# Patient Record
Sex: Male | Born: 1965 | Race: White | Hispanic: No | Smoking: Former smoker
Health system: Southern US, Community
[De-identification: ages and names within clinical notes are randomized; demographics above are authoritative.]

## PROBLEM LIST (undated history)

## (undated) DIAGNOSIS — E785 Hyperlipidemia, unspecified: Secondary | ICD-10-CM

## (undated) DIAGNOSIS — F1910 Other psychoactive substance abuse, uncomplicated: Secondary | ICD-10-CM

## (undated) DIAGNOSIS — F329 Major depressive disorder, single episode, unspecified: Secondary | ICD-10-CM

## (undated) DIAGNOSIS — F32A Depression, unspecified: Secondary | ICD-10-CM

## (undated) DIAGNOSIS — I10 Essential (primary) hypertension: Secondary | ICD-10-CM

## (undated) DIAGNOSIS — F319 Bipolar disorder, unspecified: Secondary | ICD-10-CM

## (undated) DIAGNOSIS — F419 Anxiety disorder, unspecified: Secondary | ICD-10-CM

## (undated) DIAGNOSIS — M1990 Unspecified osteoarthritis, unspecified site: Secondary | ICD-10-CM

## (undated) HISTORY — DX: Other psychoactive substance abuse, uncomplicated: F19.10

## (undated) HISTORY — DX: Hyperlipidemia, unspecified: E78.5

## (undated) HISTORY — DX: Anxiety disorder, unspecified: F41.9

## (undated) HISTORY — DX: Depression, unspecified: F32.A

## (undated) HISTORY — PX: CARPAL TUNNEL RELEASE: SHX101

## (undated) HISTORY — PX: TONSILLECTOMY AND ADENOIDECTOMY: SHX28

## (undated) HISTORY — DX: Major depressive disorder, single episode, unspecified: F32.9

## (undated) HISTORY — PX: SPINE SURGERY: SHX786

## (undated) HISTORY — PX: FRACTURE SURGERY: SHX138

---
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
## 1999-01-30 ENCOUNTER — Emergency Department (HOSPITAL_COMMUNITY): Admission: EM | Admit: 1999-01-30 | Discharge: 1999-01-30 | Payer: Self-pay | Admitting: Emergency Medicine

## 1999-01-30 ENCOUNTER — Encounter: Payer: Self-pay | Admitting: Emergency Medicine

## 2000-01-29 ENCOUNTER — Emergency Department (HOSPITAL_COMMUNITY): Admission: EM | Admit: 2000-01-29 | Discharge: 2000-01-29 | Payer: Self-pay | Admitting: Emergency Medicine

## 2000-04-10 ENCOUNTER — Inpatient Hospital Stay (HOSPITAL_COMMUNITY): Admission: EM | Admit: 2000-04-10 | Discharge: 2000-04-16 | Payer: Self-pay | Admitting: *Deleted

## 2000-04-14 ENCOUNTER — Encounter: Payer: Self-pay | Admitting: Emergency Medicine

## 2000-04-23 ENCOUNTER — Inpatient Hospital Stay (HOSPITAL_COMMUNITY): Admission: EM | Admit: 2000-04-23 | Discharge: 2000-04-25 | Payer: Self-pay | Admitting: *Deleted

## 2001-05-06 ENCOUNTER — Emergency Department (HOSPITAL_COMMUNITY): Admission: EM | Admit: 2001-05-06 | Discharge: 2001-05-06 | Payer: Self-pay | Admitting: *Deleted

## 2006-04-24 ENCOUNTER — Emergency Department (HOSPITAL_COMMUNITY): Admission: EM | Admit: 2006-04-24 | Discharge: 2006-04-24 | Payer: Self-pay | Admitting: Emergency Medicine

## 2006-09-09 ENCOUNTER — Ambulatory Visit: Payer: Self-pay | Admitting: Orthopedic Surgery

## 2007-06-25 ENCOUNTER — Emergency Department (HOSPITAL_COMMUNITY): Admission: EM | Admit: 2007-06-25 | Discharge: 2007-06-25 | Payer: Self-pay | Admitting: Emergency Medicine

## 2009-11-25 ENCOUNTER — Emergency Department (HOSPITAL_COMMUNITY): Admission: EM | Admit: 2009-11-25 | Discharge: 2009-11-25 | Payer: Self-pay | Admitting: Emergency Medicine

## 2010-01-22 HISTORY — PX: BACK SURGERY: SHX140

## 2010-04-05 LAB — POCT CARDIAC MARKERS
CKMB, poc: 3.7 ng/mL (ref 1.0–8.0)
Myoglobin, poc: 79.8 ng/mL (ref 12–200)
Troponin i, poc: <0.05 ng/mL (ref 0.00–0.09)

## 2010-04-05 LAB — COMPREHENSIVE METABOLIC PANEL
ALT: 49 U/L (ref 0–53)
AST: 38 U/L — ABNORMAL HIGH (ref 0–37)
Albumin: 3.8 g/dL (ref 3.5–5.2)
Alkaline Phosphatase: 70 U/L (ref 39–117)
BUN: 12 mg/dL (ref 6–23)
CO2: 26 mEq/L (ref 19–32)
Calcium: 9.3 mg/dL (ref 8.4–10.5)
Chloride: 105 mEq/L (ref 96–112)
Creatinine, Ser: 0.97 mg/dL (ref 0.4–1.5)
GFR calc Af Amer: >60 mL/min (ref >60)
GFR calc non Af Amer: >60 mL/min (ref >60)
Glucose, Bld: 101 mg/dL — ABNORMAL HIGH (ref 70–99)
Potassium: 4 mEq/L (ref 3.5–5.1)
Sodium: 138 mEq/L (ref 135–145)
Total Bilirubin: 0.5 mg/dL (ref 0.3–1.2)
Total Protein: 6.5 g/dL (ref 6.0–8.3)

## 2010-04-05 LAB — CBC
HCT: 39.3 % (ref 39.0–52.0)
Hemoglobin: 13.9 g/dL (ref 13.0–17.0)
MCH: 30.5 pg (ref 26.0–34.0)
MCHC: 35.4 g/dL (ref 30.0–36.0)
MCV: 86.2 fL (ref 78.0–100.0)
Platelets: 194 10*3/uL (ref 150–400)
RBC: 4.56 MIL/uL (ref 4.22–5.81)
RDW: 11.8 % (ref 11.5–15.5)
WBC: 6.7 10*3/uL (ref 4.0–10.5)

## 2010-04-05 LAB — DIFFERENTIAL
Basophils Absolute: 0 10*3/uL (ref 0.0–0.1)
Basophils Relative: 0 % (ref 0–1)
Eosinophils Absolute: 0.1 10*3/uL (ref 0.0–0.7)
Eosinophils Relative: 2 % (ref 0–5)
Lymphocytes Relative: 33 % (ref 12–46)
Lymphs Abs: 2.2 10*3/uL (ref 0.7–4.0)
Monocytes Absolute: 0.4 10*3/uL (ref 0.1–1.0)
Monocytes Relative: 6 % (ref 3–12)
Neutro Abs: 4 10*3/uL (ref 1.7–7.7)
Neutrophils Relative %: 59 % (ref 43–77)

## 2010-04-25 ENCOUNTER — Encounter: Payer: Self-pay | Admitting: Family Medicine

## 2010-04-25 DIAGNOSIS — E781 Pure hyperglyceridemia: Secondary | ICD-10-CM | POA: Insufficient documentation

## 2010-04-25 DIAGNOSIS — E8881 Metabolic syndrome: Secondary | ICD-10-CM | POA: Insufficient documentation

## 2010-04-25 DIAGNOSIS — E291 Testicular hypofunction: Secondary | ICD-10-CM

## 2010-04-25 DIAGNOSIS — Z720 Tobacco use: Secondary | ICD-10-CM | POA: Insufficient documentation

## 2010-04-25 DIAGNOSIS — I10 Essential (primary) hypertension: Secondary | ICD-10-CM | POA: Insufficient documentation

## 2010-04-25 DIAGNOSIS — F319 Bipolar disorder, unspecified: Secondary | ICD-10-CM | POA: Insufficient documentation

## 2010-05-01 ENCOUNTER — Other Ambulatory Visit: Payer: Self-pay | Admitting: Family Medicine

## 2010-05-01 DIAGNOSIS — M545 Low back pain, unspecified: Secondary | ICD-10-CM

## 2010-05-03 ENCOUNTER — Ambulatory Visit
Admission: RE | Admit: 2010-05-03 | Discharge: 2010-05-03 | Disposition: A | Discharge status: Home / Self Care | Payer: 59 | Source: Ambulatory Visit | Attending: Family Medicine | Admitting: Family Medicine

## 2010-05-03 DIAGNOSIS — M545 Low back pain, unspecified: Secondary | ICD-10-CM

## 2010-05-16 ENCOUNTER — Other Ambulatory Visit: Payer: Self-pay | Admitting: Neurosurgery

## 2010-05-16 DIAGNOSIS — M79605 Pain in left leg: Secondary | ICD-10-CM

## 2010-05-16 DIAGNOSIS — M545 Low back pain, unspecified: Secondary | ICD-10-CM

## 2010-05-17 ENCOUNTER — Ambulatory Visit
Admission: RE | Admit: 2010-05-17 | Discharge: 2010-05-17 | Disposition: A | Discharge status: Home / Self Care | Payer: 59 | Source: Ambulatory Visit | Attending: Neurosurgery | Admitting: Neurosurgery

## 2010-05-17 DIAGNOSIS — M79605 Pain in left leg: Secondary | ICD-10-CM

## 2010-05-17 DIAGNOSIS — M545 Low back pain, unspecified: Secondary | ICD-10-CM

## 2010-05-24 ENCOUNTER — Encounter (HOSPITAL_COMMUNITY)
Admission: RE | Admit: 2010-05-24 | Discharge: 2010-05-24 | Disposition: A | Discharge status: Home / Self Care | Payer: 59 | Source: Ambulatory Visit | Attending: Neurosurgery | Admitting: Neurosurgery

## 2010-05-24 ENCOUNTER — Ambulatory Visit (HOSPITAL_COMMUNITY)
Admission: RE | Admit: 2010-05-24 | Discharge: 2010-05-24 | Disposition: A | Discharge status: Home / Self Care | Payer: 59 | Source: Ambulatory Visit | Attending: Neurosurgery | Admitting: Neurosurgery

## 2010-05-24 ENCOUNTER — Other Ambulatory Visit (HOSPITAL_COMMUNITY): Payer: Self-pay | Admitting: Neurosurgery

## 2010-05-24 DIAGNOSIS — Z01818 Encounter for other preprocedural examination: Secondary | ICD-10-CM | POA: Insufficient documentation

## 2010-05-24 DIAGNOSIS — Z01812 Encounter for preprocedural laboratory examination: Secondary | ICD-10-CM | POA: Insufficient documentation

## 2010-05-24 DIAGNOSIS — M5126 Other intervertebral disc displacement, lumbar region: Secondary | ICD-10-CM | POA: Insufficient documentation

## 2010-05-24 DIAGNOSIS — Z01811 Encounter for preprocedural respiratory examination: Secondary | ICD-10-CM | POA: Insufficient documentation

## 2010-05-24 LAB — COMPREHENSIVE METABOLIC PANEL
ALT: 35 U/L (ref 0–53)
AST: 28 U/L (ref 0–37)
Albumin: 3.7 g/dL (ref 3.5–5.2)
CO2: 30 mEq/L (ref 19–32)
Calcium: 9.9 mg/dL (ref 8.4–10.5)
Chloride: 96 mEq/L (ref 96–112)
GFR calc Af Amer: >60 mL/min (ref >60)
GFR calc non Af Amer: >60 mL/min (ref >60)
Sodium: 135 mEq/L (ref 135–145)
Total Bilirubin: 0.5 mg/dL (ref 0.3–1.2)

## 2010-05-24 LAB — CBC
Hemoglobin: 15.5 g/dL (ref 13.0–17.0)
Platelets: 268 10*3/uL (ref 150–400)
RBC: 5.21 MIL/uL (ref 4.22–5.81)
WBC: 9.9 10*3/uL (ref 4.0–10.5)

## 2010-05-26 ENCOUNTER — Inpatient Hospital Stay (HOSPITAL_COMMUNITY): Payer: 59

## 2010-05-26 ENCOUNTER — Inpatient Hospital Stay (HOSPITAL_COMMUNITY)
Admission: RE | Admit: 2010-05-26 | Discharge: 2010-05-27 | DRG: 491 | Disposition: A | Discharge status: Home / Self Care | Payer: 59 | Source: Ambulatory Visit | Attending: Neurosurgery | Admitting: Neurosurgery

## 2010-05-26 ENCOUNTER — Other Ambulatory Visit (HOSPITAL_COMMUNITY): Payer: Self-pay | Admitting: Neurosurgery

## 2010-05-26 ENCOUNTER — Ambulatory Visit (HOSPITAL_COMMUNITY)
Admission: RE | Admit: 2010-05-26 | Discharge: 2010-05-26 | Disposition: A | Discharge status: Home / Self Care | Payer: 59 | Source: Ambulatory Visit | Attending: Neurosurgery | Admitting: Neurosurgery

## 2010-05-26 DIAGNOSIS — M5126 Other intervertebral disc displacement, lumbar region: Principal | ICD-10-CM | POA: Diagnosis present

## 2010-05-26 DIAGNOSIS — F411 Generalized anxiety disorder: Secondary | ICD-10-CM | POA: Diagnosis present

## 2010-05-26 DIAGNOSIS — I10 Essential (primary) hypertension: Secondary | ICD-10-CM | POA: Diagnosis present

## 2010-05-26 DIAGNOSIS — M549 Dorsalgia, unspecified: Secondary | ICD-10-CM

## 2010-05-26 DIAGNOSIS — F17200 Nicotine dependence, unspecified, uncomplicated: Secondary | ICD-10-CM | POA: Diagnosis present

## 2010-05-26 DIAGNOSIS — F319 Bipolar disorder, unspecified: Secondary | ICD-10-CM | POA: Diagnosis present

## 2010-05-26 DIAGNOSIS — E669 Obesity, unspecified: Secondary | ICD-10-CM | POA: Diagnosis present

## 2010-05-28 NOTE — Op Note (Signed)
NAME:  Tim [name], Tim [name]               ACCOUNT NO.:  [id number]  MEDICAL RECORD NO.:  [id number]           PATIENT TYPE:  O  LOCATION:  XRAY                         FACILITY:  [facility]  PHYSICIAN:  Hilda Lias, M.D.   DATE OF BIRTH:  05/19/1965  DATE OF PROCEDURE:  05/26/2010 DATE OF DISCHARGE:  05/26/2010                              OPERATIVE REPORT   PREOPERATIVE DIAGNOSIS:  Left L4-L5 herniated disk, severe degenerative disk disease with left L5-1 stenosis, multiple level degenerative disk disease, right and left.  POSTOPERATIVE DIAGNOSIS:  Left L4-L5 herniated disk, severe degenerative disk disease with left L5-1 stenosis, multiple level degenerative disk disease, right and left.  PROCEDURE:  Left L4-5 diskectomy, decompressing the thecal sac, L4-L5 nerve root.  The left L5-S1 foraminotomy and laminotomy with decompression of the L5 and S1 nerve root.  Microscope.  SURGEON:  Hilda Lias, MD.  CLINICAL HISTORY:  Ms. Zane is a 45 year old gentleman, complaining of back pain, radiating to the left neck, failing conservative treatment. The patient's MRI showed multiple level degenerative disk disease from T12 down to L5-S1.  Although, by x-ray, he had problem also at the right side and symptomatic on to the left side associated with a herniated disk at the L4-L5 and L5-S1 foraminal narrowing.  The patient want to proceed with surgery because of no improvement.  He know the risks of paralysis and need for further surgery.  He knows about the need to getting better and physical shape.  DESCRIPTION OF PROCEDURE:  The patient was taken to the OR and after intubation, he was positioned in a prone manner.  The back was cleaned with DuraPrep.  Drapes were applied.  We were unable to feel any bony structure and the midline incision was made through the skin and subcutaneous tissue through the thick adipose tissue to reach the spinous process.  Retraction was done laterally and  x-rays showed that indeed we were at the level of L4-5 and L5-S1.  He has one __________ space, so we are talking about space of the sacrum and therefore below as confirmed with radiologist.  With the help of the microscope, we drilled the lower lamina of L5 and the upper of S1.  The yellow ligament was also excised.  At this level, L5-S2, we found he has quite a hypertrophied facet with narrowing of the foramen, and foraminotomy to decompress the thecal sac as well as the S1 nerve root was achieved. Then, after we drilled the L4 and L5 lamina, we removed the yellow ligament.  We retracted the thecal sac and there was a herniated disk mostly superior and medial.  Incision was made at the disk space and large amount of degenerative disk was removed.  At the end, we had gross diskectomy with plenty of space for the thecal sac as well as the L4, L5, and S1 nerve root.  The area was irrigated.  Valsalva maneuver was negative.  Then, Depo-Medrol and fentanyl were left in the epidural space, and the wound was closed with Vicryl and Steri-Strips.          ______________________________ Hilda Lias, M.D.  EB/MEDQ  D:  05/26/2010  T:  05/27/2010  Job:  [id number]  Electronically Signed by Hilda Lias M.D. on 05/28/2010 08:11:55 PM

## 2010-06-09 NOTE — H&P (Signed)
[facility]  Patient:    Tim [name], Tim [name]                      MRN: [id number] Adm. Date:  [id number] Attending:  Denny Peon Dictator:   Candi Leash. Theressa Stamps, N.P.                   Psychiatric Admission Assessment  DATE OF ADMISSION:  April 10, 2000  PATIENT IDENTIFICATION:  This is a 45 year old married white male voluntarily admitted on April 10, 2000 for alcohol detoxification, cocaine abuse, and depression.  HISTORY OF PRESENT ILLNESS:  The patient presents with a long history of alcohol abuse and cocaine abuse.  The patient started drinking at the age of 54, has been drinking for the past two to three years, drinking 16- 12 ounce beers per day.  He starts drinking in the morning.  He has no history of DTs or seizures.  He does have a history of blackouts.  He has been drinking heavily since 1996 after his divorce.  The patient has a history of depression for years, had a suicide attempt back when he was 18.  He has been on antidepressants years ago but only very intermittently.  The patient denies any auditory or visual hallucinations, has some feelings of paranoia, currently denying any suicide attempts.  The patient sleeps poorly, has been gaining weight he feels from the alcohol.  PAST PSYCHIATRIC HISTORY:  He has had an admission when he was 60 at [facility] and hospitalized in 1997 for alcohol and cocaine detoxification.  He presently has no outpatient treatment.  SUBSTANCE ABUSE HISTORY:  He has smoked a half a pack a day for years.  His alcohol habits include he started drinking when he was 20 but drinking heavily since 1996, 16- 12 ounce beers a day for the past two to three years.  He had an approximately one week history of sobriety.  He has a history of blackouts, no history of seizures or DTs.  His last drink was on March 20 at 12:30 p.m. The patient also has been using cocaine for several months, uses about an eight ball a day;  last use approximately two weeks ago.  Also has a history of marijuana and has been taking an occasional Xanax over the past couple of months.  PAST MEDICAL HISTORY:  Primary care Alaena Strader: None.  Medical problems: He reports he has had elevated blood pressure, takes no medications. Medications: He takes Tylenol PM every evening for sleep.  No known drug allergies.  REVIEW OF SYSTEMS:  The patient reports night sweats with change in his appetite.  He has noticed increased weight gain.  No fever or chills.  The patient has problems sometimes with nausea.  He states he vomits after his breakfast.  The patient has noticed some blurred vision.  Has a history of diabetes in his family.  It has been a couple of years since he has had an eye exam.  The patient reports some heart problems when he gets anxious; no chest pain or chest pressure.  He has some shortness of breath, he feels from his weight, and some panic attacks.  He coughs every morning.  He smokes, has been since the age of 75.  The patient states he gets nauseated every morning.  He has some problems with heartburn.  No constipation, no diarrhea, no blood in his stool, no urinary frequency or hematuria, no discharge.  No joint  swelling, stiffness or joint pain.  He has an occasional migraine.  He has a history of depression.  No history of environmental allergies.  PHYSICAL EXAMINATION:  Height 5 feet 11 inches, weight 267 pounds.  Last vital signs were temperature 99.7, heart rate 119, respiratory rate 20, blood pressure 160/99.  A 45 year old well-developed, overweight white male sitting on the exam table in no acute distress.  Does not appear to be acutely or chronically ill.  Skin is warm and dry without significant discoloration or bruising.  Head is normocephalic, scalp is clean, hair is of normal distribution of color.  Face: No marked asymmetry, can raise his eyebrows, can puff out his cheeks.  Cranial nerves are intact.   Eyes: Pupils equal, round and reactive to light.  EOMs are intact.  Funduscopic exam reveal sharp disk margins, no hemorrhages were noted.  Nose: No sinus discharge, turbinates are not swollen.  Ears: Canals are clear, tympanic membranes are intact, not injected.  Mouth: No unusual breath odors, no pharyngeal exudate, no lesions were seen.  Tongue protrudes without any tremor.  Teeth are in fair dentition. Thyroid is not enlarged and nontender.  Neck: Trachea is midline, negative lymphadenopathy.  Chest is clear to auscultation.  Heart is regular rate and rhythm without murmurs, gallops, or rubs.  Abdomen is soft, protuberant; no masses or tenderness, no palpable organomegaly, positive bowel sounds. Genitalia is deferred.  Extremities: Full range of motion in joints, good grip strength bilaterally.  Back: Normal curvature, no pain upon palpation. Neurologic: Alert, cranial nerves are intact.  Reflexes are 2+, a little diminished on the left.  The patient has had a fair amount of surgery on that left knee.  Cerebellar function is intact, heel-to-shin, normal alternating movements.  Romberg is negative.  Gait is steady.  Health maintenance issues were discussed in regard to eye exams, dental exams.  LABORATORY DATA:  SGOT elevated at 61, SGPT 103.  Alcohol level was 7.  Blood pressure on admission was 160/99 with the patient slightly febrile at 99.7.  SOCIAL HISTORY:  He is a 45 year old married white male, been married for four years.  He has one biological child the age of 2.  He lives with his child, stepchild, and his spouse.  He is unemployed.  He has been doing some work from his home.  He completed high school.  He has no legal problems.  Family history: Mothers and fathers side both have problems with depression.  He has an aunt with obsessive-compulsive disorder and depression.  MENTAL STATUS EXAMINATION:  Alert but reports feeling very sleepy, casually dressed.  Little eye  contact, cooperative.  Speech is normal and relevant. Mood is depressed.  Affect is sad.  Thought processes are coherent.  No evidence of psychosis, no suicidal or homicidal ideations, no auditory or  visual hallucinations, some feelings of paranoia on the day of admission. Cognitive functioning is intact.  Memory is fair.  Judgment is poor.  Insight is fair.  Poor impulse control.  ADMISSION DIAGNOSES: Axis I:    1. Depressive disorder, not otherwise specified.            2. Alcohol dependence.            3. Cocaine abuse. Axis II:   Deferred. Axis III:  None. Axis IV:   Severe with problems relating to primary support group, occupation,            and other psychosocial problems related to alcohol abuse and  cocaine abuse. Axis V:    Current is 30, this past year is 60.  INITIAL PLAN OF CARE:  Voluntary admission to [facility] for alcohol dependence, cocaine abuse, and depression.  Contract for safety, check every 15 minutes; the patient agrees to be safe.  Will initiate phenobarbital protocol.  Will obtain labs, blood alcohol stat, CMET, and urine drug screen. The patient will be started on an antidepressant to decrease his depressive symptoms.  Will order some trazodone for sleep.  Will initiate Protonix 40 mg for acid reflux.  Phenergan will be available for nausea.  Encourage fluids. The patient is to attend groups.  Family session will also be ordered.  Plan is to return the patient to his prior living arrangement and to attend either AA meetings or a rehabilitation program.  ESTIMATED LENGTH OF STAY:  Three to five days. DD:  04/11/00 TD:  04/12/00 Job: [id number]

## 2010-06-09 NOTE — H&P (Signed)
[facility]  Patient:    Tim [name], Tim [name]                      MRN: [id number] Adm. Date:  [id number] Disc. Date: 04/25/00 Attending:  Jasmine Pang CC:         [facility]   Psychiatric Admission Assessment  IDENTIFYING INFORMATION:  This is a 45 year old Caucasian male who had just been discharged from our unit in the last week.  He was readmitted after referral by his outpatient therapist at the [facility].  HISTORY OF PRESENT ILLNESS:  Patient has a history of depression and alcohol dependence.  He was just discharged after a detox from alcohol last week.  He was also placed on Prozac 20 mg q.a.m. and Klonopin 0.5 mg p.o. q.i.d.  He was readmitted after escalating depressive symptoms with multiple neurovegetative symptoms including irritability, insomnia, feelings of hopelessness and worthlessness, anergia, anhedonia, and difficulty concentrating.  He began to plan to kill himself by taking an overdose.  PAST PSYCHIATRIC HISTORY:  Patient is treated outpatient at the [facility] as indicated above.  He has had a recent hospitalization at [facility].  Dr. Lourdes Sledge was his doctor.  He was hospitalized at [facility] several years ago.  PAST MEDICAL HISTORY:  Patient has hypertension, GERD, migraines.  MEDICATIONS: 1. Prozac 20 mg q.d. 2. Clonazepam 0.5 mg t.i.d. 3. Trazodone 100 mg q.h.s. 4. Protonix 40 mg q.d. 5. Inderal 20 mg in the morning and at h.s.  DRUG ALLERGIES:  No known drug allergies.  FAMILY PSYCHIATRIC HISTORY:  Mother and father have a history of depression. His maternal aunt has a history of depression.  Maternal aunt has a history of alcoholism.  Uncle has a history of alcoholism.  SUBSTANCE ABUSE HISTORY:  Patient has a history of alcohol and cocaine dependence.  He states until recently cocaine has been his drug of choice, but he has had to decrease this use due to finances.  He  has also used other drugs in the past.  SOCIAL HISTORY:  Patient lives with his wife and two children (ages 2 and 8). He states his wife is very supportive.  He has no legal problems.  ADMISSION MENTAL STATUS EXAMINATION:  Friendly, cooperative, somewhat disheveled Caucasian male with intermittent eye contact.  There was psychomotor retardation.  Speech normal rate and slow, verbal, spontaneous. Mood was depressed and anxious.  Affect consistent with mood, but able to reveal wide range.  Positive suicidal ideation upon admission, but he denies now.  No homicidal ideation.  There was no psychosis or perceptual disturbance.  Thought processes were logical and goal directed.  Thought content revolved around his upset that many of his peers from the previous admission have relapsed (he states he had been keeping in contact with most of them).  On cognitive exam patient was alert and oriented to person, place, time, and reason for being in the hospital.  Short term and long term memory were adequate.  General fund of knowledge was age and education level appropriate. Attention and concentration were diminished.  Judgment and insight fair.  ADMISSION DIAGNOSES: Axis I:    1. Major depression, recurrent, severe, without psychosis.            2. Alcohol dependence.            3. Cocaine dependence. Axis II:   Deferred. Axis III:  1. Hypertension.  2. Gastroesophageal reflux disease.            3. Migraines. Axis IV:   Severe. Axis V:    Current global assessment of functioning is 30, highest past year            is 65.  PATIENT ASSETS AND STRENGTHS:  Patient was healthy, supportive family, help seeking attitude.  PROBLEMS:  Mood instability and chemical dependence with suicidal ideation.  SHORT TERM TREATMENT GOAL:  Resolution of suicidal ideation.  LONG TERM TREATMENT GOAL:  Resolution of mood instability, commitment to an active recovery program.  HOSPITAL COURSE:   Upon admission patient was continued on his home medications of Prozac 20 mg q.d., Klonopin 0.5 mg p.o. q.i.d., Trazodone 50 mg p.o. q.h.s., Inderal 20 mg q.a.m. and q.h.s., Protonix 40 mg q.d., and multivitamin one p.o. q.d.  He was given a one time dose of Ambien 10 mg due to his difficulty getting to sleep on the first night of admission; however, in general, he states the Trazodone has been beneficial for sleep.  On April 24, 2000, Prozac was increased to 30 mg q.a.m.  Klonopin was increased to 1 mg p.o. q.i.d. due to continued anxiety.  Some sedation with the increased dose, but overall no side effects.  He participated appropriately in unit therapeutic groups and activities.  We were able to use this hospitalization as crisis stabilization and he recompensated quickly.  He was ready for discharge soon after admission.  DISCHARGE MENTAL STATUS EXAMINATION:  Mood was improved.  He was less anxious and depressed.  Affect was wider range.  There were no suicidal or homicidal ideation.  There was no psychosis or perceptual disturbance.  Thought processes were logical and goal directed.  Cognitive exam was back to baseline.  Judgment and insight remained fair.  DISCHARGE DIAGNOSES: Axis I:    1. Major depressive disorder, recurrent, severe, without psychosis.            2. Alcohol dependence.            3. Cocaine dependence. Axis II:   Deferred. Axis III:  1. Hypertension.            2. Gastroesophageal reflux disease.            3. Migraine headaches. Axis IV:   Severe. Axis V:    Global assessment of functioning, current is 30, highest past year            58.  DISCHARGE MEDICATIONS: 1. Prozac 30 mg q.a.m. 2. Klonopin 1 mg p.o. q.i.d. 3. Trazodone 50 mg p.o. q.h.s. 4. Inderal 20 mg q.a.m. and h.s. 5. Protonix 40 mg q.d.  ACTIVITY LEVEL:  No restrictions.  DIET:  No restrictions.  POST HOSPITAL CARE PLANS:  Return to [facility] for follow-up therapy and medication  management. DD:  04/25/00 TD:  04/25/00  Job: [id number]

## 2010-06-09 NOTE — Discharge Summary (Signed)
[facility]  Patient:    Tim [name], Tim [name]                        MRN: [id number] Adm. Date:  [id number] Disc. Date: [id number] Attending:  Annamarie Dawley Dictator:   Candi Leash. Orsini, N.P.                           Discharge Summary  HISTORY OF PRESENT ILLNESS:  This is a 45 year old married white male voluntarily admitted for alcohol detox, cocaine abuse and depression.  The patient presents with a long history of alcohol abuse and cocaine abuse.  He has been drinking since the age of 85.  He drinks all day.  He has no history of DTs or seizures.  He has a history of blackouts.  He has been drinking heavily since 1996 after his divorce.  The patient also has a history of depression for years with a suicide attempt when he was 45 years old.  He has been on antidepressants years ago but only on an intermittent basis.  The patient was denying any hallucinations, having some feelings of paranoia. Currently denying any suicide attempts. He sleeps poorly. He has been gaining some weight, which he feels is from the alcohol.  The patient did have admission when he was 42 at [facility] and also years ago for alcohol and cocaine detox.  He is currently not having any outpatient treatments.  PRIMARY CARE PHYSICIAN:  The patient has no primary care Jeanmarie Mccowen.  MEDICAL PROBLEMS:  Hypertension.  He is presently on no medications.  CURRENT MEDICATIONS:  Tylenol P.M. every evening for sleep.  ALLERGIES:  No known drug allergies.  PHYSICAL EXAMINATION:  This was performed and the patient had a mild elevated temperature at 99.7.  He was overweight at 267 pounds.  Overall, the patient presented no acute or chronic health problems.  Blood pressure was somewhat elevated at 160/99.  LABORATORY DATA:  SGOT was elevated 61, SGPT was 103.  Alcohol level was 7.  MENTAL STATUS EXAMINATION:  He is alert but reports feeling very sleepy.  He is casually dressed, little eye  contact, and cooperative. Speech is normal and relevant.  Mood is depressed. Affect is sad.  Thought processes are coherent. No evidence of psychosis.  No evidence of homicidal or suicidal ideation.  No auditory or visual hallucinations.  There were some feelings of paranoia on the day of admission.  Cognitive functioning is intact.  Memory is fair. Judgement is poor.  Insight is fair.  Impulse control is poor.  ADMISSION DIAGNOSES: AXIS I.   1. Depressive disorder, not otherwise specified.           2. Alcohol dependence.           3. Cocaine abuse. AXIS II.  Deferred. AXIS III. None. AXIS IV.  Severe with problems relating to primary support group, occupation,           and other psychosocial problems related to alcohol abuse and           cocaine. AXIS V.   Current is 30, this past year was 60.  HOSPITAL COURSE:  A voluntary admission for alcohol dependence and cocaine abuse and depression.  The patient will be monitored every 15 minutes, will initiate the phenobarbital protocol, obtain labs and blood alcohol stat as well as CMET and urine drug screen.  The patient will be started  on antidepressant to decrease his depressive symptoms.  Will order trazodone for sleep, initiate Protonix 40 mg for acid reflux, Phenergan will be available for nausea. Will encourage fluids.  The patient is to attend groups with a family session to be ordered.  We were felt the patient should return back to his prior living arrangements and attend either AA meetings or a rehab program.  The patient maintained that he was very depressed but had no intentions of hurting himself.  His family was very supportive.  We added Pamelor for his treatment of depression.  He was experiencing some withdrawal symptoms.  His vital signs, though, remained stable.  He was having some complaints of headache and pain.  The patient did have an episode of chest pain.  He was transferred to the emergency department for  evaluation.  His work-up was negative.  It was probably related to his severe anxiety. We started the patient on some Klonopin on a routine and p.r.n. basis.  The patient was doing much better on his phenobarbital protocol.  He was much calmer.  The patient continued having episodes of anxiety that were relieved with his Klonopin.  CONDITION ON DISCHARGE:  The patient received great benefit from his Klonopin. He was feeling much better.  He was presenting with a bright affect. There were no withdrawal symptoms.  No sign of psychosis.  No suicidal ideation. The patient will be started on Inderal 20 mg for his elevated blood pressure. He has no history of asthma.  It was felt that the patient could be managed on an outpatient basis.  He was having considerable insight into his problem and was pledging sobriety.  FOLLOW-UP:  The patient was to follow up with the [facility] with the phone number and address provided.  He was to be there on April 17, 2000, and to have a psychiatric follow-up with Dr. Octavio Graves.  He was also to follow up with his primary care Tawnia Schirm for liver function test.  He was also to call his family physician for treatment of his elevated blood pressure and call if there are any problems with medications or if he was having depression. The patient was also to attend AA and NA meetings. He was to abstain from alcohol.  DISCHARGE MEDICATIONS: 1. Prozac 20 mg one q.d. 2. Trazodone 50 mg one q.h.s. as needed for insomnia. 3. Klonopin 0.5 mg three times a day as needed for anxiety. 4. Inderal 20 mg one b.i.d. for elevated blood pressure. 5. Protonix 40 mg one daily.  DISCHARGE DIAGNOSES: AXIS I.   1. Depressive disorder, not otherwise specified.           2. Alcohol dependence.           3. Cocaine abuse. AXIS II.  Deferred. AXIS III. None. AXIS IV.  Severe psychosocial stressors. AXIS V.   Current is 83, this past year was 60.DD:  05/22/00 TD:  05/24/00 Job:  [id number]

## 2011-01-22 ENCOUNTER — Encounter (HOSPITAL_COMMUNITY): Payer: Self-pay | Admitting: *Deleted

## 2011-01-22 ENCOUNTER — Other Ambulatory Visit: Payer: Self-pay

## 2011-01-22 ENCOUNTER — Emergency Department (HOSPITAL_COMMUNITY)
Admission: EM | Admit: 2011-01-22 | Discharge: 2011-01-23 | Disposition: A | Discharge status: Other Acute Inpatient Hospital | Payer: Self-pay | Attending: Emergency Medicine | Admitting: Emergency Medicine

## 2011-01-22 DIAGNOSIS — R45851 Suicidal ideations: Secondary | ICD-10-CM | POA: Insufficient documentation

## 2011-01-22 DIAGNOSIS — I10 Essential (primary) hypertension: Secondary | ICD-10-CM | POA: Insufficient documentation

## 2011-01-22 DIAGNOSIS — Z79899 Other long term (current) drug therapy: Secondary | ICD-10-CM | POA: Insufficient documentation

## 2011-01-22 HISTORY — DX: Essential (primary) hypertension: I10

## 2011-01-22 LAB — URINALYSIS, ROUTINE W REFLEX MICROSCOPIC
Glucose, UA: NEGATIVE mg/dL
Ketones, ur: NEGATIVE mg/dL
Leukocytes, UA: NEGATIVE
Nitrite: NEGATIVE
Specific Gravity, Urine: 1.029 (ref 1.005–1.030)
pH: 5.5 (ref 5.0–8.0)

## 2011-01-22 LAB — CBC
HCT: 44.8 % (ref 39.0–52.0)
Hemoglobin: 15.5 g/dL (ref 13.0–17.0)
MCHC: 34.6 g/dL (ref 30.0–36.0)
RBC: 5.22 MIL/uL (ref 4.22–5.81)
WBC: 8.1 10*3/uL (ref 4.0–10.5)

## 2011-01-22 LAB — COMPREHENSIVE METABOLIC PANEL
Albumin: 3.9 g/dL (ref 3.5–5.2)
Alkaline Phosphatase: 88 U/L (ref 39–117)
BUN: 19 mg/dL (ref 6–23)
Calcium: 9.4 mg/dL (ref 8.4–10.5)
GFR calc Af Amer: 82 mL/min — ABNORMAL LOW (ref >90)
Glucose, Bld: 104 mg/dL — ABNORMAL HIGH (ref 70–99)
Potassium: 4.6 mEq/L (ref 3.5–5.1)
Total Protein: 7.5 g/dL (ref 6.0–8.3)

## 2011-01-22 LAB — RAPID URINE DRUG SCREEN, HOSP PERFORMED
Amphetamines: NOT DETECTED
Benzodiazepines: NOT DETECTED
Opiates: NOT DETECTED
Tetrahydrocannabinol: NOT DETECTED

## 2011-01-22 NOTE — ED Provider Notes (Signed)
11:15 PM  Date: 01/22/2011  Rate: 87  Rhythm: normal sinus rhythm  QRS Axis: normal  Intervals: normal  ST/T Wave abnormalities: normal  Conduction Disutrbances:none  Narrative Interpretation: Normal EKG.  Old EKG Reviewed: unchanged    Carleene Cooper III, MD 01/22/11 [phone]

## 2011-01-22 NOTE — ED Provider Notes (Cosign Needed)
History     CSN: [id number]  Arrival date & time 01/22/11  7829   First MD Initiated Contact with Patient 01/22/11 2147      Chief Complaint  Patient presents with  . Medical Clearance  . Suicidal    (Consider location/radiation/quality/duration/timing/severity/associated sxs/prior treatment) HPI Comments: Patient sent from [facility] for medical clearance labs. Patient states he is depressed and has thoughts of hurting himself. Patient states he feels healthy today except for some mild anxiety. He denies chest pain, shortness of breath, fever, chills, nausea, vomiting, or diarrhea. Patient has chronic back pain. The patient denies homicidal ideation.  Patient is a 45 y.o. male presenting with mental health disorder. The history is provided by the patient.  Mental Health Problem The current episode started more than 1 month ago.  Additional symptoms of the illness do not include no headaches or no abdominal pain. He admits to suicidal ideas.    Past Medical History  Diagnosis Date  . Substance abuse   . Hypertension     History reviewed. No pertinent past surgical history.  History reviewed. No pertinent family history.  History  Substance Use Topics  . Smoking status: Former Games developer  . Smokeless tobacco: Not on file  . Alcohol Use: Yes     daily      Review of Systems  Constitutional: Negative for fever and chills.  HENT: Negative for sore throat and rhinorrhea.   Eyes: Negative for redness.  Respiratory: Negative for shortness of breath.   Cardiovascular: Negative for chest pain.  Gastrointestinal: Negative for nausea, vomiting, abdominal pain, diarrhea and constipation.  Genitourinary: Negative for dysuria.  Musculoskeletal: Negative for myalgias.  Skin: Negative for rash.  Neurological: Negative for dizziness and headaches.  Psychiatric/Behavioral: Positive for suicidal ideas.    Allergies  Review of patient's allergies indicates no known allergies.  Home  Medications   Current Outpatient Rx  Name Route Sig Dispense Refill  . QUETIAPINE FUMARATE 100 MG PO TABS Oral Take 100 mg by mouth at bedtime.      Marland Kitchen CITALOPRAM HYDROBROMIDE 40 MG PO TABS Oral Take 40 mg by mouth daily.      . FENOFIBRATE 145 MG PO TABS Oral Take 145 mg by mouth daily.      Marland Kitchen HYDROCHLOROTHIAZIDE 25 MG PO TABS Oral Take 25 mg by mouth daily.      Marland Kitchen LAMOTRIGINE 100 MG PO TABS Oral Take 100 mg by mouth daily.      . TESTOSTERONE CYPIONATE 200 MG/ML IM OIL Intramuscular Inject 200 mg into the muscle every 14 (fourteen) days.        BP 183/103  Pulse 84  Temp(Src) 97.7 F (36.5 C) (Oral)  Resp 20  SpO2 97%  Physical Exam  Nursing note and vitals reviewed. Constitutional: He is oriented to person, place, and time. He appears well-developed and well-nourished.  HENT:  Head: Normocephalic and atraumatic.  Eyes: Conjunctivae are normal. Right eye exhibits no discharge. Left eye exhibits no discharge.  Neck: Normal range of motion. Neck supple.  Cardiovascular: Normal rate, regular rhythm and normal heart sounds.   Pulmonary/Chest: Effort normal and breath sounds normal.  Abdominal: Soft. Bowel sounds are normal. There is no tenderness. There is no rebound and no guarding.  Musculoskeletal: He exhibits no edema.  Neurological: He is alert and oriented to person, place, and time.  Skin: Skin is warm and dry.  Psychiatric: He has a normal mood and affect.    ED Course  Procedures (including  critical care time)  Labs Reviewed  COMPREHENSIVE METABOLIC PANEL - Abnormal; Notable for the following:    Glucose, Bld 104 (*)    AST 46 (*) SLIGHT HEMOLYSIS   ALT 73 (*)    GFR calc non Af Amer 70 (*)    GFR calc Af Amer 82 (*)    All other components within normal limits  ETHANOL  URINE RAPID DRUG SCREEN (HOSP PERFORMED)  URINALYSIS, ROUTINE W REFLEX MICROSCOPIC  CBC  TSH   Results for orders placed during the hospital encounter of 01/22/11  ETHANOL      Component  Value Range   Alcohol, Ethyl (B) <11  0 - 11 (mg/dL)  URINE RAPID DRUG SCREEN (HOSP PERFORMED)      Component Value Range   Opiates NONE DETECTED  NONE DETECTED    Cocaine NONE DETECTED  NONE DETECTED    Benzodiazepines NONE DETECTED  NONE DETECTED    Amphetamines NONE DETECTED  NONE DETECTED    Tetrahydrocannabinol NONE DETECTED  NONE DETECTED    Barbiturates NONE DETECTED  NONE DETECTED   COMPREHENSIVE METABOLIC PANEL      Component Value Range   Sodium 136  135 - 145 (mEq/L)   Potassium 4.6  3.5 - 5.1 (mEq/L)   Chloride 99  96 - 112 (mEq/L)   CO2 26  19 - 32 (mEq/L)   Glucose, Bld 104 (*) 70 - 99 (mg/dL)   BUN 19  6 - 23 (mg/dL)   Creatinine, Ser 1.19  0.50 - 1.35 (mg/dL)   Calcium 9.4  8.4 - 14.7 (mg/dL)   Total Protein 7.5  6.0 - 8.3 (g/dL)   Albumin 3.9  3.5 - 5.2 (g/dL)   AST 46 (*) 0 - 37 (U/L)   ALT 73 (*) 0 - 53 (U/L)   Alkaline Phosphatase 88  39 - 117 (U/L)   Total Bilirubin 0.3  0.3 - 1.2 (mg/dL)   GFR calc non Af Amer 70 (*) >90 (mL/min)   GFR calc Af Amer 82 (*) >90 (mL/min)  URINALYSIS, ROUTINE W REFLEX MICROSCOPIC      Component Value Range   Color, Urine YELLOW  YELLOW    APPearance CLEAR  CLEAR    Specific Gravity, Urine 1.029  1.005 - 1.030    pH 5.5  5.0 - 8.0    Glucose, UA NEGATIVE  NEGATIVE (mg/dL)   Hgb urine dipstick NEGATIVE  NEGATIVE    Bilirubin Urine NEGATIVE  NEGATIVE    Ketones, ur NEGATIVE  NEGATIVE (mg/dL)   Protein, ur NEGATIVE  NEGATIVE (mg/dL)   Urobilinogen, UA 0.2  0.0 - 1.0 (mg/dL)   Nitrite NEGATIVE  NEGATIVE    Leukocytes, UA NEGATIVE  NEGATIVE   CBC      Component Value Range   WBC 8.1  4.0 - 10.5 (K/uL)   RBC 5.22  4.22 - 5.81 (MIL/uL)   Hemoglobin 15.5  13.0 - 17.0 (g/dL)   HCT 82.9  56.2 - 13.0 (%)   MCV 85.8  78.0 - 100.0 (fL)   MCH 29.7  26.0 - 34.0 (pg)   MCHC 34.6  30.0 - 36.0 (g/dL)   RDW 86.5  78.4 - 69.6 (%)   Platelets 274  150 - 400 (K/uL)  TSH      Component Value Range   TSH 6.160 (*) 0.350 - 4.500  (uIU/mL)   No results found.     1. Suicidal ideation     10:27 PM patient seen and examined. Workup is  pending. No acute medical complaints.  1:13 AM patient accepted back to [facility]. Will discharge.  MDM  Suicidal ideation, depression   Medical screening examination/treatment/procedure(s) were performed by non-physician practitioner and as supervising physician I was immediately available for consultation/collaboration. TSH noted to be high in lab results.   Osvaldo Human, M.D.      Eustace Moore [place], [place] 01/23/11 0114  Carleene Cooper III, MD 01/23/11 [phone]

## 2011-01-22 NOTE — ED Notes (Signed)
Pt c/o SI since April, no specific plan. Pt denies HI, hallucinations and delusions.

## 2011-01-22 NOTE — ED Notes (Signed)
Pt is refusing all blood draw

## 2011-01-23 LAB — TSH: TSH: 6.16 u[IU]/mL — ABNORMAL HIGH (ref 0.350–4.500)

## 2011-01-23 NOTE — ED Notes (Signed)
Report faxed to [facility] rec'd- informed to call when pt ready

## 2016-05-14 ENCOUNTER — Encounter: Payer: Self-pay | Admitting: Family Medicine

## 2016-05-14 ENCOUNTER — Ambulatory Visit (INDEPENDENT_AMBULATORY_CARE_PROVIDER_SITE_OTHER): Payer: BLUE CROSS/BLUE SHIELD | Admitting: Family Medicine

## 2016-05-14 VITALS — BP 166/98 | HR 100 | Temp 97.4°F | Resp 20 | Ht 70.5 in | Wt 282.0 lb

## 2016-05-14 DIAGNOSIS — L608 Other nail disorders: Secondary | ICD-10-CM

## 2016-05-14 DIAGNOSIS — Z87891 Personal history of nicotine dependence: Secondary | ICD-10-CM | POA: Diagnosis not present

## 2016-05-14 DIAGNOSIS — I10 Essential (primary) hypertension: Secondary | ICD-10-CM

## 2016-05-14 DIAGNOSIS — F1911 Other psychoactive substance abuse, in remission: Secondary | ICD-10-CM

## 2016-05-14 DIAGNOSIS — E781 Pure hyperglyceridemia: Secondary | ICD-10-CM | POA: Diagnosis not present

## 2016-05-14 DIAGNOSIS — F1011 Alcohol abuse, in remission: Secondary | ICD-10-CM | POA: Diagnosis not present

## 2016-05-14 DIAGNOSIS — Z6839 Body mass index (BMI) 39.0-39.9, adult: Secondary | ICD-10-CM

## 2016-05-14 DIAGNOSIS — E669 Obesity, unspecified: Secondary | ICD-10-CM | POA: Insufficient documentation

## 2016-05-14 MED ORDER — LISINOPRIL-HYDROCHLOROTHIAZIDE 10-12.5 MG PO TABS: 1.0000 | ORAL_TABLET | Freq: Every day | ORAL | 3 refills | Status: DC

## 2016-05-14 NOTE — Patient Instructions (Signed)
Take the BP medicine once a day Walk every day that you are able Need blood work Need physical in about 2-3 weeks

## 2016-05-14 NOTE — Progress Notes (Signed)
Chief Complaint  Patient presents with  . Establish Care  51 year old gentleman here to establish care. He hasn't seen a physician since 2012. He states previously he had problems with drug and alcohol addiction. He has stopped drugs completely, and quit drinking heavily a year and 9 months ago. He states he still has an occasional alcoholic beverage but no intoxication. He states he's been through rehabilitation for drug and alcohol on more than one occasion. He is not currently attending any rehabilitation program, AAA, or N/A. He has a history of hypertension and is off his medications. He has a history of hyperlipidemia and previously took medications. He has chronic low back pain and has had lumbar surgery. He states he takes "a lot of ibuprofen". His major complaint today is pain in his left thumb. He has not had blood work in many years. He states his tetanus was 7 years ago. He has never had a flu shot. Previously had a history of depression and bipolar illness. He was on multiple medications. He feels this is related to his substance abuse. He has not been on medication since he quit drug use. He does not feel the need to be on medication or see a therapist at this time.   Patient Active Problem List   Diagnosis Date Noted  . History of prior cigarette smoking 05/14/2016  . Substance abuse in remission 05/14/2016  . Alcohol abuse, in remission 05/14/2016  . Pincer nail deformity 05/14/2016  . Class 2 obesity with body mass index (BMI) of 39.0 to 39.9 in adult 05/14/2016  . Hypertriglyceridemia 04/25/2010  . Metabolic syndrome 04/25/2010  . HTN (hypertension) 04/25/2010  . Bipolar 1 disorder (HCC) 04/25/2010    Outpatient Encounter Prescriptions as of 05/14/2016  Medication Sig  . acetaminophen (TYLENOL) 325 MG tablet Take 650 mg by mouth every 6 (six) hours as needed.  Marland Kitchen ibuprofen (ADVIL,MOTRIN) 200 MG tablet Take 200 mg by mouth every 6 (six) hours as needed.  .  Melatonin 3 MG TABS Take by mouth.  Marland Kitchen lisinopril-hydrochlorothiazide (PRINZIDE,ZESTORETIC) 10-12.5 MG tablet Take 1 tablet by mouth daily.   No facility-administered encounter medications on file as of 05/14/2016.     Past Medical History:  Diagnosis Date  . Anxiety   . Depression   . Hyperlipidemia   . Hypertension   . Substance abuse    recovered - alcohol 2015, drugs 2012    Past Surgical History:  Procedure Laterality Date  . BACK SURGERY  2012  . FRACTURE SURGERY     right hand  . HAND SURGERY  1988  . SPINE SURGERY     , and lumbar decompression  . TONSILLECTOMY AND ADENOIDECTOMY      Social History   Social History  . Marital status: Divorced    Spouse name: N/A  . Number of children: 3  . Years of education: 73   Occupational History  . sheet metal fabrication    Social History Main Topics  . Smoking status: Former Games developer  . Smokeless tobacco: Never Used  . Alcohol use Yes     Comment: occasionally/ been through rehab  . Drug use: Yes     Comment: relapse w/ crack 11/2010  . Sexual activity: Not Currently    Birth control/ protection: None   Other Topics Concern  . Not on file   Social History Narrative   Divorced   custody of two daughters   Recovered drug and alcohol  Family History  Problem Relation Age of Onset  . Arthritis Mother   . Cancer Father     barretts esophagus  . Depression Father   . Diabetes Father   . Hyperlipidemia Father   . Hypertension Father   . Stroke Maternal Grandmother   . Heart disease Maternal Grandfather 30  . Dementia Paternal Grandmother   . Diabetes Paternal Grandfather     Review of Systems  Constitutional: Negative for chills, fever and weight loss.  HENT: Negative for congestion and hearing loss.   Eyes: Negative for blurred vision and pain.  Respiratory: Negative for cough and shortness of breath.   Cardiovascular: Negative for chest pain and leg swelling.  Gastrointestinal: Negative for  abdominal pain, constipation, diarrhea and heartburn.  Genitourinary: Negative for dysuria and frequency.  Musculoskeletal: Positive for back pain. Negative for falls, joint pain and myalgias.  Neurological: Negative for dizziness, seizures and headaches.  Psychiatric/Behavioral: Negative for depression. The patient is not nervous/anxious and does not have insomnia.     BP (!) 166/98 (BP Location: Right Arm, Patient Position: Sitting, Cuff Size: Large)   Pulse 100   Temp 97.4 F (36.3 C) (Temporal)   Resp 20   Ht 5' 10.5" (1.791 m)   Wt 282 lb 0.6 oz (127.9 kg)   SpO2 98%   BMI 39.90 kg/m   Physical Exam  Constitutional: He is oriented to person, place, and time. He appears well-developed and well-nourished.  Obese  HENT:  Head: Normocephalic and atraumatic.  Right Ear: External ear normal.  Left Ear: External ear normal.  Mouth/Throat: Oropharynx is clear and moist.  Eyes: Conjunctivae are normal. Pupils are equal, round, and reactive to light.  Neck: Normal range of motion. Neck supple. No thyromegaly present.  Cardiovascular: Normal rate, regular rhythm and normal heart sounds.   Pulmonary/Chest: Effort normal and breath sounds normal. No respiratory distress.  Abdominal: Soft. Bowel sounds are normal.  Musculoskeletal: Normal range of motion. He exhibits no edema.  Lymphadenopathy:    He has no cervical adenopathy.  Neurological: He is alert and oriented to person, place, and time.  Gait normal  Skin: Skin is warm and dry.  Left thumbnail has a pincer deformity  Psychiatric: He has a normal mood and affect. His behavior is normal. Thought content normal.  Nursing note and vitals reviewed.  ASSESSMENT/PLAN:  1. History of prior cigarette smoking Quit 2012  2. Substance abuse in remission Quit 2012, multiple rehabs  3. Alcohol abuse, in remission Quit 2015, multiple rehabs. Current alcohol in moderation  4. Pincer nail deformity Painful, patient desires  treatment - Ambulatory referral to Dermatology  5. Essential hypertension Untreated. Lisinopril/hydrochlorothiazide as prescribed. Patient's to take daily. Return in 2 weeks for repeat blood pressure. - CBC - Comprehensive metabolic panel - Lipid panel - Urinalysis, Routine w reflex microscopic  6. Hypertriglyceridemia Untreated  7. Class 2 severe obesity due to excess calories with serious comorbidity and body mass index (BMI) of 39.0 to 39.9 in adult [facility]) Recommend diet and exercise. Should walk daily when able. Needs to reduce portion and fats.   Patient Instructions  Take the BP medicine once a day Walk every day that you are able Need blood work Need physical in about 2-3 weeks     Eustace Moore, MD

## 2016-05-15 ENCOUNTER — Encounter: Payer: Self-pay | Admitting: Family Medicine

## 2016-05-15 LAB — URINALYSIS, ROUTINE W REFLEX MICROSCOPIC
BILIRUBIN URINE: NEGATIVE
GLUCOSE, UA: NEGATIVE
HGB URINE DIPSTICK: NEGATIVE
KETONES UR: NEGATIVE
Leukocytes, UA: NEGATIVE
Nitrite: NEGATIVE
PH: 5.5 (ref 5.0–8.0)
SPECIFIC GRAVITY, URINE: 1.022 (ref 1.001–1.035)

## 2016-05-15 LAB — COMPREHENSIVE METABOLIC PANEL
ALK PHOS: 61 U/L (ref 40–115)
ALT: 31 U/L (ref 9–46)
AST: 25 U/L (ref 10–40)
Albumin: 4.5 g/dL (ref 3.6–5.1)
BUN: 19 mg/dL (ref 7–25)
CALCIUM: 9.5 mg/dL (ref 8.6–10.3)
CHLORIDE: 104 mmol/L (ref 98–110)
CO2: 23 mmol/L (ref 20–31)
Creat: 1.07 mg/dL (ref 0.60–1.35)
Glucose, Bld: 88 mg/dL (ref 65–99)
Potassium: 4.2 mmol/L (ref 3.5–5.3)
Sodium: 137 mmol/L (ref 135–146)
TOTAL PROTEIN: 7.4 g/dL (ref 6.1–8.1)
Total Bilirubin: 0.5 mg/dL (ref 0.2–1.2)

## 2016-05-15 LAB — LIPID PANEL
CHOL/HDL RATIO: 8.2 ratio — AB (ref <5.0)
Cholesterol: 286 mg/dL — ABNORMAL HIGH (ref <200)
HDL: 35 mg/dL — AB (ref >40)
TRIGLYCERIDES: 1279 mg/dL — AB (ref <150)

## 2016-05-15 LAB — CBC
HEMATOCRIT: 42.5 % (ref 38.5–50.0)
Hemoglobin: 14.6 g/dL (ref 13.2–17.1)
MCH: 30 pg (ref 27.0–33.0)
MCHC: 34.4 g/dL (ref 32.0–36.0)
MCV: 87.3 fL (ref 80.0–100.0)
MPV: 10.1 fL (ref 7.5–12.5)
PLATELETS: 224 10*3/uL (ref 140–400)
RBC: 4.87 MIL/uL (ref 4.20–5.80)
RDW: 14.3 % (ref 11.0–15.0)
WBC: 9 10*3/uL (ref 3.8–10.8)

## 2016-05-15 MED ORDER — ATORVASTATIN CALCIUM 40 MG PO TABS: 40.0000 mg | ORAL_TABLET | Freq: Every day | ORAL | 11 refills | Status: DC

## 2016-05-23 DIAGNOSIS — L980 Pyogenic granuloma: Secondary | ICD-10-CM | POA: Diagnosis not present

## 2016-05-23 DIAGNOSIS — L603 Nail dystrophy: Secondary | ICD-10-CM | POA: Diagnosis not present

## 2016-05-30 DIAGNOSIS — L281 Prurigo nodularis: Secondary | ICD-10-CM | POA: Diagnosis not present

## 2016-05-31 ENCOUNTER — Ambulatory Visit (INDEPENDENT_AMBULATORY_CARE_PROVIDER_SITE_OTHER): Payer: BLUE CROSS/BLUE SHIELD | Admitting: Family Medicine

## 2016-05-31 ENCOUNTER — Other Ambulatory Visit: Payer: Self-pay

## 2016-05-31 ENCOUNTER — Encounter: Payer: Self-pay | Admitting: Family Medicine

## 2016-05-31 VITALS — BP 152/78 | HR 98 | Temp 98.0°F | Resp 18 | Ht 71.0 in | Wt 281.0 lb

## 2016-05-31 DIAGNOSIS — I10 Essential (primary) hypertension: Secondary | ICD-10-CM | POA: Diagnosis not present

## 2016-05-31 DIAGNOSIS — Z23 Encounter for immunization: Secondary | ICD-10-CM

## 2016-05-31 DIAGNOSIS — Z0000 Encounter for general adult medical examination without abnormal findings: Secondary | ICD-10-CM | POA: Diagnosis not present

## 2016-05-31 DIAGNOSIS — E781 Pure hyperglyceridemia: Secondary | ICD-10-CM

## 2016-05-31 DIAGNOSIS — Z6839 Body mass index (BMI) 39.0-39.9, adult: Secondary | ICD-10-CM | POA: Diagnosis not present

## 2016-05-31 NOTE — Patient Instructions (Signed)
Let me know if the hand surgeon is needed Take medicine every day Try to eat well and walk every day that you are able Need blood work in 3 months follow up after labs Need fish oil 2000 mg a day

## 2016-05-31 NOTE — Progress Notes (Signed)
Chief Complaint  Patient presents with  . Annual Exam   Feels well Compliant with medicine Did not get letter with labs Discussed the HIGH triglycerides.  Diet sheet given.  Is on lipitor.  Will re check at 3 months Discussed weight.  Needs diet and exercise. Not smoking, no drugs, rare beer.   Needs TdaP Went to dermatology for his nail.   Patient Active Problem List   Diagnosis Date Noted  . History of prior cigarette smoking 05/14/2016  . Substance abuse in remission 05/14/2016  . Alcohol abuse, in remission 05/14/2016  . Pincer nail deformity 05/14/2016  . Class 2 obesity with body mass index (BMI) of 39.0 to 39.9 in adult 05/14/2016  . Hypertriglyceridemia 04/25/2010  . Metabolic syndrome 04/25/2010  . HTN (hypertension) 04/25/2010  . Bipolar 1 disorder (HCC) 04/25/2010    Outpatient Encounter Prescriptions as of 05/31/2016  Medication Sig  . acetaminophen (TYLENOL) 325 MG tablet Take 650 mg by mouth every 6 (six) hours as needed.  Marland Kitchen ibuprofen (ADVIL,MOTRIN) 200 MG tablet Take 200 mg by mouth every 6 (six) hours as needed.  Marland Kitchen lisinopril-hydrochlorothiazide (PRINZIDE,ZESTORETIC) 10-12.5 MG tablet Take 1 tablet by mouth daily.  . Melatonin 3 MG TABS Take by mouth.  Marland Kitchen atorvastatin (LIPITOR) 40 MG tablet Take 1 tablet (40 mg total) by mouth daily. (Patient not taking: Reported on 05/31/2016)   No facility-administered encounter medications on file as of 05/31/2016.     No Known Allergies  Review of Systems  Constitutional: Negative for activity change, appetite change and unexpected weight change.  HENT: Negative for congestion and dental problem.   Eyes: Negative for discharge and visual disturbance.  Respiratory: Negative for cough and shortness of breath.   Cardiovascular: Negative for chest pain, palpitations and leg swelling.  Gastrointestinal: Negative for abdominal pain, constipation and diarrhea.  Genitourinary: Negative for dysuria and frequency.    Musculoskeletal: Positive for back pain. Negative for arthralgias.       Chronic  Skin: Positive for wound.       thumb  Neurological: Negative for dizziness and headaches.  Hematological: Negative for adenopathy. Does not bruise/bleed easily.  Psychiatric/Behavioral: Positive for dysphoric mood and sleep disturbance. The patient is nervous/anxious.    BP (!) 152/78 (BP Location: Right Arm, Patient Position: Sitting)   Pulse 98   Temp 98 F (36.7 C) (Temporal)   Resp 18   Ht 5\' 11"  (1.803 m)   Wt 281 lb (127.5 kg)   SpO2 98%   BMI 39.19 kg/m   Physical Exam  BP (!) 152/78 (BP Location: Right Arm, Patient Position: Sitting)   Pulse 98   Temp 98 F (36.7 C) (Temporal)   Resp 18   Ht 5\' 11"  (1.803 m)   Wt 281 lb (127.5 kg)   SpO2 98%   BMI 39.19 kg/m   General Appearance:    Alert, cooperative, no distress, appears stated age.  Obese.  Head:    Normocephalic, without obvious abnormality, atraumatic  Eyes:    PERRL, conjunctiva/corneas clear, EOM's intact, fundi    benign, both eyes       Ears:    Normal TM's and external ear canals, both ears  Nose:   Nares normal, septum midline, mucosa normal, no drainage   or sinus tenderness  Throat:   Lips, mucosa, and tongue normal; teeth and gums normal  Neck:   Supple, symmetrical, trachea midline, no adenopathy;       thyroid:  No enlargement/tenderness/nodules  Back:     Symmetric, no curvature, ROM normal, no CVA tenderness  Lungs:     Clear to auscultation bilaterally, respirations unlabored  Chest wall:    No tenderness or deformity  Heart:    Regular rate and rhythm, S1 and S2 normal, no murmur, rub   or gallop  Abdomen:     Soft, non-tender, bowel sounds active all four quadrants,    no masses, no organomegaly. protuberant  Genitalia:    Normal male without lesion, discharge or tenderness  Extremities:   Extremities normal, atraumatic, no cyanosis or edema  Pulses:   2+ and symmetric all extremities  Skin:   Skin  color, texture, turgor normal, no rashes or lesions  Lymph nodes:   Cervical, supraclavicular, and axillary nodes normal  Neurologic:   Normal strength, sensation and reflexes      throughout     ASSESSMENT/PLAN:  1. Essential hypertension improved - EKG 12-Lead  2. Hypertriglyceridemia On medicine  3. Class 2 severe obesity due to excess calories with serious comorbidity and body mass index (BMI) of 39.0 to 39.9 in adult [facility](HCC) Discussed health risks and recommendations.  4. Physical exam, routine No abnormal findings   Patient Instructions  Let me know if the hand surgeon is needed Take medicine every day Try to eat well and walk every day that you are able Need blood work in 3 months follow up after labs Need fish oil 2000 mg a day    Eustace MooreYvonne Sue Deshawnda Acrey, MD

## 2016-06-05 ENCOUNTER — Telehealth: Payer: Self-pay | Admitting: Family Medicine

## 2016-06-05 DIAGNOSIS — L608 Other nail disorders: Secondary | ICD-10-CM

## 2016-06-05 NOTE — Telephone Encounter (Signed)
Patient calling about the L thumb nail.  He went to the dermatologist, but she can't take the nail off.  He is still experiencing pain that has not gotten better.  Please call and advise. Another referral?

## 2016-06-05 NOTE — Telephone Encounter (Signed)
Hand surgery

## 2016-06-11 ENCOUNTER — Ambulatory Visit (INDEPENDENT_AMBULATORY_CARE_PROVIDER_SITE_OTHER): Payer: BLUE CROSS/BLUE SHIELD | Admitting: Orthopedic Surgery

## 2016-06-11 ENCOUNTER — Encounter (INDEPENDENT_AMBULATORY_CARE_PROVIDER_SITE_OTHER): Payer: Self-pay | Admitting: Orthopedic Surgery

## 2016-06-11 VITALS — Ht 71.0 in | Wt 281.0 lb

## 2016-06-11 DIAGNOSIS — L03012 Cellulitis of left finger: Secondary | ICD-10-CM | POA: Diagnosis not present

## 2016-06-11 MED ORDER — MUPIROCIN 2 % EX OINT: 1.0000 "application " | TOPICAL_OINTMENT | Freq: Two times a day (BID) | CUTANEOUS | 3 refills | Status: DC

## 2016-06-11 NOTE — Progress Notes (Signed)
Office Visit Note   Patient: Tim [name]           Date of Birth: Feb 06, 1965           MRN: [id number] Visit Date: 06/11/2016              Requested by: Eustace Moore, MD [phone] [address] [place], [place] [address] PCP: Eustace Moore, MD  Chief Complaint  Patient presents with  . Left Hand - Nail Problem    Thumb nail deformity       HPI: Patient states that he's had over a month history of an infection of the medial border left thumb nail with ingrown nail. Patient states he's had a few debridements performed but the nail has not been removed. He has completed a course of Cipro.  Assessment & Plan: Visit Diagnoses:  1. Paronychia of left thumb     Plan: After informed consent the medial border of the nail was excised he tolerated this well prescription is sent in for Bactroban to start twice a day Bactroban dressing changes with Dial soap cleansing daily.  Follow-Up Instructions: Return in about 1 week (around 06/18/2016).   Ortho Exam  Patient is alert, oriented, no adenopathy, well-dressed, normal affect, normal respiratory effort. Examination patient has a paronychial infection over the medial border of the left thumbnail with cellulitis swelling and ingrown nail. There is no ascending cellulitis. After informed consent patient underwent a digital block with 20 mL of 1% lidocaine plain the medial border of the nail was then excised the paronychial infection was completely decompressed down to healthy viable granulation tissue there is no residual infection. The wound was then packed open with Xeroform gauze and a dry dressing.  Imaging: No results found.  Labs: No results found for: HGBA1C, ESRSEDRATE, CRP, LABURIC, REPTSTATUS, GRAMSTAIN, CULT, LABORGA  Orders:  No orders of the defined types were placed in this encounter.  Meds ordered this encounter  Medications  . mupirocin ointment (BACTROBAN) 2 %    Sig: Apply 1 application topically 2 (two)  times daily. Apply to the affected area 2 times a day    Dispense:  22 g    Refill:  3     Procedures: No procedures performed  Clinical Data: No additional findings.  ROS:  All other systems negative, except as noted in the HPI. Review of Systems  Objective: Vital Signs: Ht 5\' 11"  (1.803 m)   Wt 281 lb (127.5 kg)   BMI 39.19 kg/m   Specialty Comments:  No specialty comments available.  PMFS History: Patient Active Problem List   Diagnosis Date Noted  . Paronychia of left thumb 06/11/2016  . History of prior cigarette smoking 05/14/2016  . Substance abuse in remission 05/14/2016  . Alcohol abuse, in remission 05/14/2016  . Pincer nail deformity 05/14/2016  . Class 2 obesity with body mass index (BMI) of 39.0 to 39.9 in adult 05/14/2016  . Hypertriglyceridemia 04/25/2010  . Metabolic syndrome 04/25/2010  . HTN (hypertension) 04/25/2010  . Bipolar 1 disorder (HCC) 04/25/2010   Past Medical History:  Diagnosis Date  . Anxiety   . Depression   . Hyperlipidemia   . Hypertension   . Substance abuse    recovered - alcohol 2015, drugs 2012    Family History  Problem Relation Age of Onset  . Arthritis Mother   . Cancer Father        barretts esophagus  . Depression Father   .  Diabetes Father   . Hyperlipidemia Father   . Hypertension Father   . Stroke Maternal Grandmother   . Heart disease Maternal Grandfather 1965  . Dementia Paternal Grandmother   . Diabetes Paternal Grandfather     Past Surgical History:  Procedure Laterality Date  . BACK SURGERY  2012  . FRACTURE SURGERY     right hand  . HAND SURGERY  1988  . SPINE SURGERY     , and lumbar decompression  . TONSILLECTOMY AND ADENOIDECTOMY     Social History   Occupational History  . sheet metal fabrication    Social History Main Topics  . Smoking status: Former Games developermoker  . Smokeless tobacco: Never Used  . Alcohol use Yes     Comment: occasionally/ been through rehab  . Drug use: Yes      Comment: relapse w/ crack 11/2010  . Sexual activity: Not Currently    Birth control/ protection: None

## 2016-06-21 ENCOUNTER — Ambulatory Visit (INDEPENDENT_AMBULATORY_CARE_PROVIDER_SITE_OTHER): Payer: BLUE CROSS/BLUE SHIELD | Admitting: Orthopedic Surgery

## 2016-08-30 ENCOUNTER — Ambulatory Visit (INDEPENDENT_AMBULATORY_CARE_PROVIDER_SITE_OTHER): Payer: BLUE CROSS/BLUE SHIELD | Admitting: Family Medicine

## 2016-08-30 ENCOUNTER — Encounter: Payer: Self-pay | Admitting: Family Medicine

## 2016-08-30 VITALS — BP 130/86 | HR 96 | Temp 98.8°F | Resp 18 | Ht 71.0 in | Wt 274.0 lb

## 2016-08-30 DIAGNOSIS — I10 Essential (primary) hypertension: Secondary | ICD-10-CM

## 2016-08-30 DIAGNOSIS — M5442 Lumbago with sciatica, left side: Secondary | ICD-10-CM | POA: Diagnosis not present

## 2016-08-30 DIAGNOSIS — G8929 Other chronic pain: Secondary | ICD-10-CM | POA: Diagnosis not present

## 2016-08-30 DIAGNOSIS — E781 Pure hyperglyceridemia: Secondary | ICD-10-CM

## 2016-08-30 MED ORDER — GABAPENTIN 300 MG PO CAPS: 300.0000 mg | ORAL_CAPSULE | Freq: Three times a day (TID) | ORAL | 3 refills | Status: DC

## 2016-08-30 MED ORDER — ATORVASTATIN CALCIUM 40 MG PO TABS: 40.0000 mg | ORAL_TABLET | Freq: Every day | ORAL | 3 refills | Status: AC

## 2016-08-30 MED ORDER — IBUPROFEN 800 MG PO TABS: 800.0000 mg | ORAL_TABLET | Freq: Three times a day (TID) | ORAL | 3 refills | Status: DC | PRN

## 2016-08-30 NOTE — Progress Notes (Signed)
Chief Complaint  Patient presents with  . Follow-up    3 month  feels well in general, "working hard" Has lost weight Compliant with medicines Due for labs today New complaint of L sciatica Says he is taking ibuprofen 800 mg 2-3 times a day.  Will prescribe.  For the sciatica can add gabapentin.  Recovered addict - is  not asking for a pain medicine BP is good No depression or anxiety symptoms Patient Active Problem List   Diagnosis Date Noted  . Paronychia of left thumb 06/11/2016  . History of prior cigarette smoking 05/14/2016  . Substance abuse in remission 05/14/2016  . Alcohol abuse, in remission 05/14/2016  . Pincer nail deformity 05/14/2016  . Class 2 obesity with body mass index (BMI) of 39.0 to 39.9 in adult 05/14/2016  . Hypertriglyceridemia 04/25/2010  . Metabolic syndrome 04/25/2010  . HTN (hypertension) 04/25/2010  . Bipolar 1 disorder (HCC) 04/25/2010    Outpatient Encounter Prescriptions as of 08/30/2016  Medication Sig  . acetaminophen (TYLENOL) 325 MG tablet Take 650 mg by mouth every 6 (six) hours as needed.  Marland Kitchen atorvastatin (LIPITOR) 40 MG tablet Take 1 tablet (40 mg total) by mouth daily.  Marland Kitchen ibuprofen (ADVIL,MOTRIN) 200 MG tablet Take 200 mg by mouth every 6 (six) hours as needed.  Marland Kitchen lisinopril-hydrochlorothiazide (PRINZIDE,ZESTORETIC) 10-12.5 MG tablet Take 1 tablet by mouth daily.  . Melatonin 3 MG TABS Take by mouth.  . gabapentin (NEURONTIN) 300 MG capsule Take 1 capsule (300 mg total) by mouth 3 (three) times daily.  Marland Kitchen ibuprofen (ADVIL,MOTRIN) 800 MG tablet Take 1 tablet (800 mg total) by mouth every 8 (eight) hours as needed for moderate pain.   No facility-administered encounter medications on file as of 08/30/2016.     No Known Allergies  Review of Systems  Constitutional: Positive for unexpected weight change. Negative for activity change and appetite change.  HENT: Negative for congestion and dental problem.   Eyes: Negative for photophobia  and visual disturbance.  Respiratory: Negative for cough and shortness of breath.   Cardiovascular: Negative for chest pain, palpitations and leg swelling.  Gastrointestinal: Negative for constipation and diarrhea.  Genitourinary: Negative for dysuria and frequency.  Musculoskeletal: Positive for back pain.       Low back pain, rad to L leg.  Also calves cramp at night  Neurological: Negative for dizziness and headaches.  Psychiatric/Behavioral: Negative for dysphoric mood. The patient is not nervous/anxious.     BP 130/86 (BP Location: Right Arm, Patient Position: Sitting, Cuff Size: Large)   Pulse 96   Temp 98.8 F (37.1 C) (Temporal)   Resp 18   Ht 5\' 11"  (1.803 m)   Wt 274 lb 0.6 oz (124.3 kg)   SpO2 98%   BMI 38.22 kg/m   Physical Exam  Constitutional: He is oriented to person, place, and time. He appears well-developed and well-nourished.  Obese  HENT:  Head: Normocephalic and atraumatic.  Right Ear: External ear normal.  Left Ear: External ear normal.  Mouth/Throat: Oropharynx is clear and moist.  Eyes: Pupils are equal, round, and reactive to light. Conjunctivae are normal.  Neck: Normal range of motion. Neck supple. No thyromegaly present.  Cardiovascular: Normal rate, regular rhythm and normal heart sounds.   Pulmonary/Chest: Effort normal and breath sounds normal. No respiratory distress.  Abdominal: Soft. Bowel sounds are normal.  Musculoskeletal: Normal range of motion. He exhibits no edema.  Lumbar spine is straight and symmetric. Full range of motion. No  tenderness or muscle spasm. Strength, sensation, range of motion, and reflexes are normal in both lower extremities. Straight leg raise is negative bilateral.   Lymphadenopathy:    He has no cervical adenopathy.  Neurological: He is alert and oriented to person, place, and time.  Gait normal  Skin: Skin is warm and dry.  Left thumbnail is growing out after surgery  Psychiatric: He has a normal mood and  affect. His behavior is normal. Thought content normal.  Nursing note and vitals reviewed.   ASSESSMENT/PLAN:  1. Essential hypertension  - COMPLETE METABOLIC PANEL WITH GFR - Lipid panel - Urinalysis, Routine w reflex microscopic  2. Hypertriglyceridemia   3. Chronic midline low back pain with left-sided sciatica Add gabapentin   Patient Instructions  Check blood work today I will prescribe neurontin 300 mg Take one at night, then increase to three times day Take the ibuprofen 800 up to three times a day Take with food Continue the BP medicine and cholesterol  See me in 3 months Call sooner for problems    Eustace MooreYvonne Sue Marilin Kofman, MD

## 2016-08-30 NOTE — Patient Instructions (Addendum)
Check blood work today I will prescribe neurontin 300 mg Take one at night, then increase to three times day Take the ibuprofen 800 up to three times a day Take with food Continue the BP medicine and cholesterol  See me in 3 months Call sooner for problems

## 2016-08-31 LAB — LIPID PANEL
CHOL/HDL RATIO: 4.2 ratio (ref <5.0)
Cholesterol: 156 mg/dL (ref <200)
HDL: 37 mg/dL — ABNORMAL LOW (ref >40)
Triglycerides: 551 mg/dL — ABNORMAL HIGH (ref <150)

## 2016-08-31 LAB — COMPLETE METABOLIC PANEL WITH GFR
ALT: 32 U/L (ref 9–46)
AST: 22 U/L (ref 10–40)
Albumin: 4.6 g/dL (ref 3.6–5.1)
Alkaline Phosphatase: 58 U/L (ref 40–115)
BUN: 20 mg/dL (ref 7–25)
CO2: 22 mmol/L (ref 20–32)
Calcium: 9 mg/dL (ref 8.6–10.3)
Chloride: 102 mmol/L (ref 98–110)
Creat: 1.13 mg/dL (ref 0.60–1.35)
GFR, Est African American: 88 mL/min (ref >=60)
GFR, Est Non African American: 76 mL/min (ref >=60)
GLUCOSE: 93 mg/dL (ref 65–99)
POTASSIUM: 3.9 mmol/L (ref 3.5–5.3)
SODIUM: 136 mmol/L (ref 135–146)
Total Bilirubin: 0.6 mg/dL (ref 0.2–1.2)
Total Protein: 7 g/dL (ref 6.1–8.1)

## 2016-08-31 LAB — URINALYSIS, ROUTINE W REFLEX MICROSCOPIC
Bilirubin Urine: NEGATIVE
Glucose, UA: NEGATIVE
Hgb urine dipstick: NEGATIVE
KETONES UR: NEGATIVE
Leukocytes, UA: NEGATIVE
Nitrite: NEGATIVE
Protein, ur: NEGATIVE
SPECIFIC GRAVITY, URINE: 1.016 (ref 1.001–1.035)
pH: 5.5 (ref 5.0–8.0)

## 2016-09-27 ENCOUNTER — Telehealth: Payer: Self-pay | Admitting: Family Medicine

## 2016-09-27 MED ORDER — GABAPENTIN 400 MG PO CAPS: 400.0000 mg | ORAL_CAPSULE | Freq: Three times a day (TID) | ORAL | 3 refills | Status: DC

## 2016-09-27 NOTE — Telephone Encounter (Signed)
Patient left voicemail and is requesting you to "up his dose" of gabapentin   Cb#: [phone]

## 2016-09-27 NOTE — Telephone Encounter (Signed)
Done, called [facility], aware.

## 2016-09-27 NOTE — Telephone Encounter (Signed)
May send gabapentin 400 TID.  See me if not effective.  [facility]

## 2016-11-19 ENCOUNTER — Telehealth: Payer: Self-pay | Admitting: *Deleted

## 2016-11-19 ENCOUNTER — Ambulatory Visit (INDEPENDENT_AMBULATORY_CARE_PROVIDER_SITE_OTHER): Payer: BLUE CROSS/BLUE SHIELD | Admitting: Family Medicine

## 2016-11-19 ENCOUNTER — Encounter: Payer: Self-pay | Admitting: Family Medicine

## 2016-11-19 VITALS — BP 156/92 | HR 92 | Temp 98.9°F | Resp 20 | Ht 71.0 in | Wt 284.1 lb

## 2016-11-19 DIAGNOSIS — Z23 Encounter for immunization: Secondary | ICD-10-CM

## 2016-11-19 DIAGNOSIS — R55 Syncope and collapse: Secondary | ICD-10-CM | POA: Diagnosis not present

## 2016-11-19 NOTE — Patient Instructions (Signed)
Try to get a BP and pulse rate during a spell Call if they get worse See me in 2 weeks

## 2016-11-19 NOTE — Progress Notes (Signed)
Chief Complaint  Patient presents with  . Dizziness   Patient is here for an acute visit He complains of 3 spells of "dizziness" in the last couple of weeks One spell lasted an hour and a half, the other 2 were more brief His most recent spell was today.  No cough cold or runny nose, no head injury, no loss of consciousness.  No sweats or chills.  No change in vision.  No change in hearing.  No headache.  He gets a vague sensation that is difficult for him to describe where he feels less aware, less cognizant, unable to process information around him as if he is mildly confused.  On the first 2 spells he did not have any other symptoms, today he did feel like his heart was beating a bit faster.  He is uncertain whether this was anxiety of the cause of his problem. He has had vertigo in the past and this feels different. He has had orthostatic blood pressure changes in the past and this feels different. He has not fainted. No new medications or over-the-counter supplements.  He has not used any drugs or alcohol. He does not have any cardiac history problems with chest pain or shortness of breath, he does not have any atherosclerotic history or cerebrovascular disease.  Patient Active Problem List   Diagnosis Date Noted  . Paronychia of left thumb 06/11/2016  . History of prior cigarette smoking 05/14/2016  . Substance abuse in remission (HCC) 05/14/2016  . Alcohol abuse, in remission 05/14/2016  . Pincer nail deformity 05/14/2016  . Class 2 obesity with body mass index (BMI) of 39.0 to 39.9 in adult 05/14/2016  . Hypertriglyceridemia 04/25/2010  . Metabolic syndrome 04/25/2010  . HTN (hypertension) 04/25/2010  . Bipolar 1 disorder (HCC) 04/25/2010    Outpatient Encounter Prescriptions as of 11/19/2016  Medication Sig  . acetaminophen (TYLENOL) 325 MG tablet Take 650 mg by mouth every 6 (six) hours as needed.  Marland Kitchen. atorvastatin (LIPITOR) 40 MG tablet Take 1 tablet (40 mg total) by  mouth daily.  Marland Kitchen. gabapentin (NEURONTIN) 400 MG capsule Take 1 capsule (400 mg total) by mouth 3 (three) times daily.  Marland Kitchen. ibuprofen (ADVIL,MOTRIN) 200 MG tablet Take 200 mg by mouth every 6 (six) hours as needed.  Marland Kitchen. ibuprofen (ADVIL,MOTRIN) 800 MG tablet Take 1 tablet (800 mg total) by mouth every 8 (eight) hours as needed for moderate pain.  Marland Kitchen. lisinopril-hydrochlorothiazide (PRINZIDE,ZESTORETIC) 10-12.5 MG tablet Take 1 tablet by mouth daily.  . Melatonin 3 MG TABS Take by mouth.   No facility-administered encounter medications on file as of 11/19/2016.     No Known Allergies  Review of Systems  Constitutional: Negative for activity change, appetite change and unexpected weight change.  HENT: Negative for congestion and dental problem.   Eyes: Negative for discharge and visual disturbance.  Respiratory: Negative for cough and shortness of breath.   Cardiovascular: Negative for chest pain, palpitations and leg swelling.  Gastrointestinal: Negative for abdominal pain, constipation and diarrhea.  Genitourinary: Negative for dysuria and frequency.  Musculoskeletal: Positive for back pain. Negative for arthralgias.       Chronic  Skin: Positive for wound.       thumb  Neurological: Positive for light-headedness. Negative for dizziness, speech difficulty, numbness and headaches.  Hematological: Negative for adenopathy. Does not bruise/bleed easily.  Psychiatric/Behavioral: Negative for dysphoric mood and sleep disturbance. The patient is not nervous/anxious.     BP (!) 156/92 (BP Location: Right  Arm, Patient Position: Sitting, Cuff Size: Large)   Pulse 92   Temp 98.9 F (37.2 C) (Temporal)   Resp 20   Ht 5\' 11"  (1.803 m)   Wt 284 lb 1.9 oz (128.9 kg)   SpO2 99%   BMI 39.63 kg/m   Physical Exam  Constitutional: He is oriented to person, place, and time. He appears well-developed and well-nourished.  Obese.  Articulate.  No acute distress  HENT:  Head: Normocephalic and atraumatic.    Right Ear: External ear normal.  Left Ear: External ear normal.  Mouth/Throat: Oropharynx is clear and moist.  Eyes: Pupils are equal, round, and reactive to light. Conjunctivae are normal.  No nystagmus  Neck: Normal range of motion. Neck supple. No thyromegaly present.  No bruit  Cardiovascular: Normal rate, regular rhythm and normal heart sounds.   Pulmonary/Chest: Effort normal and breath sounds normal. No respiratory distress.  Musculoskeletal: Normal range of motion. He exhibits no edema.  Lymphadenopathy:    He has no cervical adenopathy.  Neurological: He is alert and oriented to person, place, and time. No cranial nerve deficit. Coordination normal.  Gait normal  Skin: Skin is warm and dry.  Left thumb with paronychia and granulomatous tissue.  Treated with silver nitrate  Psychiatric: He has a normal mood and affect. His behavior is normal. Thought content normal.  Nursing note and vitals reviewed.   ASSESSMENT/PLAN:  1. Need for influenza vaccination  - Flu Vaccine QUAD 36+ mos IM  2. Pre-syncope Discussed his symptoms.  We discussed that it could be cardiovascular, blood pressure, cardiac, tachycardia.  We discussed it could be cerebrovascular similar to a TIA.  We discussed that it could be metabolic.  I think seizure activity is unlikely.  I think  vertigo is unlikely.  We decided that observation for now and follow-up in 2 weeks is appropriate.   Patient Instructions  Try to get a BP and pulse rate during a spell Call if they get worse See me in 2 weeks   Eustace Moore, MD

## 2016-11-19 NOTE — Telephone Encounter (Signed)
Patient called left message stating he has had 3 dizzy spells within the last week and is having one now while leaving the message. Patient requested to see Dr Delton SeeNelson today, there are no openings with Dr Delton SeeNelson. Please call patient and advise what to do. [phone].

## 2016-11-19 NOTE — Telephone Encounter (Signed)
Patient is scheduled for 11/19/16 at 4:00 patient is aware.

## 2016-11-19 NOTE — Telephone Encounter (Signed)
Please ask him to come in at 1600 today, thank you.

## 2016-11-30 ENCOUNTER — Other Ambulatory Visit: Payer: Self-pay

## 2016-11-30 ENCOUNTER — Encounter: Payer: Self-pay | Admitting: Family Medicine

## 2016-11-30 ENCOUNTER — Ambulatory Visit: Payer: BLUE CROSS/BLUE SHIELD | Admitting: Family Medicine

## 2016-11-30 VITALS — BP 136/84 | HR 88 | Temp 98.5°F | Resp 18 | Ht 71.0 in | Wt 289.0 lb

## 2016-11-30 DIAGNOSIS — G8929 Other chronic pain: Secondary | ICD-10-CM | POA: Diagnosis not present

## 2016-11-30 DIAGNOSIS — R55 Syncope and collapse: Secondary | ICD-10-CM

## 2016-11-30 DIAGNOSIS — M5442 Lumbago with sciatica, left side: Secondary | ICD-10-CM | POA: Diagnosis not present

## 2016-11-30 DIAGNOSIS — I10 Essential (primary) hypertension: Secondary | ICD-10-CM

## 2016-11-30 MED ORDER — GABAPENTIN 400 MG PO CAPS: 400.0000 mg | ORAL_CAPSULE | Freq: Three times a day (TID) | ORAL | 3 refills | Status: DC

## 2016-11-30 NOTE — Progress Notes (Signed)
Chief Complaint  Patient presents with  . Follow-up    3 month   Patient is here for follow-up.  He has seen a reduction in his spells.  He is not exactly dizzy, but has spells where he feels lightheaded.  He has taken his blood pressure and pulse during the spells and brings me a log.  They are consistently normal.  No chest pain or pressure.  No palpitations.  He thinks it may be related to stress or anxiety.  He does not wish to start on an anxiety medication or see a specialist for this.  He has missed some time from work.  His employer is understanding he does not need a note.  He is compliant with his medication.  His blood pressure is controlled. He still has pain in his left leg.  He is on gabapentin.  He wants to begin increase this medication.  I told him to go from 3 pills a day to 4 pills a day.  I sent a new prescription to the pharmacy. His father goes to [facility] clinic in [place].  They treat knee pain.  I believe they provide Visco supplementation injections.  And he wonders if they will treat his hip.  I told him he was welcome to try, and let me know if he needs a referral.  Patient Active Problem List   Diagnosis Date Noted  . Paronychia of left thumb 06/11/2016  . History of prior cigarette smoking 05/14/2016  . Substance abuse in remission (HCC) 05/14/2016  . Alcohol abuse, in remission 05/14/2016  . Pincer nail deformity 05/14/2016  . Class 2 obesity with body mass index (BMI) of 39.0 to 39.9 in adult 05/14/2016  . Hypertriglyceridemia 04/25/2010  . Metabolic syndrome 04/25/2010  . HTN (hypertension) 04/25/2010  . Bipolar 1 disorder (HCC) 04/25/2010    Outpatient Encounter Medications as of 11/30/2016  Medication Sig  . acetaminophen (TYLENOL) 325 MG tablet Take 650 mg by mouth every 6 (six) hours as needed.  Marland Kitchen. atorvastatin (LIPITOR) 40 MG tablet Take 1 tablet (40 mg total) by mouth daily.  Marland Kitchen. gabapentin (NEURONTIN) 400 MG capsule Take 1 capsule (400 mg  total) 3 (three) times daily by mouth. May take 4 a day  . ibuprofen (ADVIL,MOTRIN) 200 MG tablet Take 200 mg by mouth every 6 (six) hours as needed.  Marland Kitchen. ibuprofen (ADVIL,MOTRIN) 800 MG tablet Take 1 tablet (800 mg total) by mouth every 8 (eight) hours as needed for moderate pain.  Marland Kitchen. lisinopril-hydrochlorothiazide (PRINZIDE,ZESTORETIC) 10-12.5 MG tablet Take 1 tablet by mouth daily.  . Melatonin 3 MG TABS Take by mouth.  . [DISCONTINUED] gabapentin (NEURONTIN) 400 MG capsule Take 1 capsule (400 mg total) by mouth 3 (three) times daily.   No facility-administered encounter medications on file as of 11/30/2016.     No Known Allergies  Review of Systems  Constitutional: Negative for activity change, appetite change and unexpected weight change.  HENT: Negative for congestion and dental problem.   Eyes: Negative for discharge and visual disturbance.  Respiratory: Negative for cough and shortness of breath.   Cardiovascular: Negative for chest pain, palpitations and leg swelling.  Gastrointestinal: Negative for abdominal pain, constipation and diarrhea.  Genitourinary: Negative for dysuria and frequency.  Musculoskeletal: Positive for back pain. Negative for arthralgias.       Chronic with radiating left leg pain  Skin: Positive for wound.       thumb  Neurological: Positive for light-headedness. Negative for dizziness, speech difficulty,  numbness and headaches.       In spells, improving  Hematological: Negative for adenopathy. Does not bruise/bleed easily.  Psychiatric/Behavioral: Negative for dysphoric mood and sleep disturbance. The patient is not nervous/anxious.     BP 136/84   Pulse 88   Temp 98.5 F (36.9 C) (Temporal)   Resp 18   Ht 5\' 11"  (1.803 m)   Wt 289 lb 0.6 oz (131.1 kg)   SpO2 97%   BMI 40.31 kg/m   Physical Exam  Constitutional: He is oriented to person, place, and time. He appears well-developed and well-nourished.  Obese.  Articulate.  No acute distress    HENT:  Head: Normocephalic and atraumatic.  Right Ear: External ear normal.  Left Ear: External ear normal.  Mouth/Throat: Oropharynx is clear and moist.  Eyes: Conjunctivae are normal. Pupils are equal, round, and reactive to light.  No nystagmus  Neck: Normal range of motion. Neck supple.  No bruit  Cardiovascular: Normal rate, regular rhythm and normal heart sounds.  Pulmonary/Chest: Effort normal and breath sounds normal. No respiratory distress.  Musculoskeletal: Normal range of motion. He exhibits no edema.  Neurological: He is alert and oriented to person, place, and time. No cranial nerve deficit. Coordination normal.  Gait normal  Skin: Skin is warm and dry.  Left thumb with paronychia and granulomatous tissue.  Smaller than last visit  Psychiatric: He has a normal mood and affect. His behavior is normal. Thought content normal.  Nursing note and vitals reviewed.   ASSESSMENT/PLAN:  1 chronic midline low back pain with left-sided sciatica 2 presyncope 3 essential hypertension  Patient Instructions  I can refer for flexogenic treatment of your hip Increase the gabapentin to 4 pills a day See me every 3 months Call sooner for problems   Eustace MooreYvonne Sue Karalee Hauter, MD

## 2016-11-30 NOTE — Patient Instructions (Signed)
I can refer for flexogenic treatment of your hip Increase the gabapentin to 4 pills a day See me every 3 months Call sooner for problems

## 2017-01-24 ENCOUNTER — Ambulatory Visit: Payer: BLUE CROSS/BLUE SHIELD | Admitting: Family Medicine

## 2017-01-24 ENCOUNTER — Other Ambulatory Visit: Payer: Self-pay

## 2017-01-24 ENCOUNTER — Encounter: Payer: Self-pay | Admitting: Family Medicine

## 2017-01-24 VITALS — BP 138/84 | HR 88 | Temp 98.9°F | Resp 18 | Ht 71.0 in | Wt 295.1 lb

## 2017-01-24 DIAGNOSIS — M549 Dorsalgia, unspecified: Secondary | ICD-10-CM | POA: Diagnosis not present

## 2017-01-24 DIAGNOSIS — F319 Bipolar disorder, unspecified: Secondary | ICD-10-CM

## 2017-01-24 DIAGNOSIS — G43009 Migraine without aura, not intractable, without status migrainosus: Secondary | ICD-10-CM | POA: Diagnosis not present

## 2017-01-24 DIAGNOSIS — G8929 Other chronic pain: Secondary | ICD-10-CM

## 2017-01-24 DIAGNOSIS — L03012 Cellulitis of left finger: Secondary | ICD-10-CM

## 2017-01-24 MED ORDER — BETAMETHASONE DIPROPIONATE 0.05 % EX CREA: TOPICAL_CREAM | Freq: Two times a day (BID) | CUTANEOUS | 0 refills | Status: DC

## 2017-01-24 MED ORDER — SUMATRIPTAN SUCCINATE 50 MG PO TABS: 50.0000 mg | ORAL_TABLET | ORAL | 0 refills | Status: DC | PRN

## 2017-01-24 MED ORDER — CYCLOBENZAPRINE HCL 10 MG PO TABS: 10.0000 mg | ORAL_TABLET | Freq: Three times a day (TID) | ORAL | 0 refills | Status: DC | PRN

## 2017-01-24 NOTE — Progress Notes (Signed)
Chief Complaint  Patient presents with  . Depression  . Headache    x 4 weeks " burning odor"   Patient is here today with multiple complaints.  He continues to have paronychia in his thumb, raised proud flesh, pain, and a bad odor.  This is been going on for a long time.  Sometimes chronic paronychia will get better with corticosteroid application.  I have given him a prescription with instructions on use. He continues to have chronic low back pain.  He is taking gabapentin.  He cannot tell that it helps.  He wants to stop the gabapentin.  I told him to cut it in half, see if he continues to need it, and then taper it slowly over time. For the chronic back pain he wonders if he can take as needed Flexeril.  I discussed with him that it does cause mild sedation, which makes it somewhat mood altering.  He should use it with caution given his history of addiction problem.  This is prescribed for as needed use. He has headaches for many years.  They have been worse lately.  He gets headaches sometimes 3 times a day.  Today he has not had one.  He has them several days a week.  It is a severe headache, usually right-sided, with photophobia and phonophobia.  If he rests in a dark room or sleeps and it gets better.  He has not been diagnosed with migraines.  He has nausea but no vomiting.  He does not have an aura.  I am going to give him a trial of sumatriptan to see if it helps. He also complains of severe depression.  He states is been building for about a month.  He thought it was because of the holidays but it persists.  He has a history of bipolar disorder.  He has been on multiple medications.  He states the medicines that help him the best on lithium and Depakote.  He took these in combination.  I am not comfortable prescribing him these medications, but I will give him a referral to mental health.  He is not suicidal, is able to work, and feels safe waiting to take medication until he can be  seen.  Patient Active Problem List   Diagnosis Date Noted  . Chronic back pain greater than 3 months duration 01/24/2017  . Migraine headache without aura 01/24/2017  . Paronychia of left thumb 06/11/2016  . History of prior cigarette smoking 05/14/2016  . Substance abuse in remission (HCC) 05/14/2016  . Alcohol abuse, in remission 05/14/2016  . Pincer nail deformity 05/14/2016  . Class 2 obesity with body mass index (BMI) of 39.0 to 39.9 in adult 05/14/2016  . Hypertriglyceridemia 04/25/2010  . Metabolic syndrome 04/25/2010  . HTN (hypertension) 04/25/2010  . Bipolar 1 disorder (HCC) 04/25/2010    Outpatient Encounter Medications as of 01/24/2017  Medication Sig  . acetaminophen (TYLENOL) 325 MG tablet Take 650 mg by mouth every 6 (six) hours as needed.  Marland Kitchen. atorvastatin (LIPITOR) 40 MG tablet Take 1 tablet (40 mg total) by mouth daily.  Marland Kitchen. gabapentin (NEURONTIN) 400 MG capsule Take 1 capsule (400 mg total) 3 (three) times daily by mouth. May take 4 a day (Patient taking differently: Take 800 mg by mouth 3 (three) times daily. May take 4 a day)  . ibuprofen (ADVIL,MOTRIN) 200 MG tablet Take 200 mg by mouth every 6 (six) hours as needed.  Marland Kitchen. ibuprofen (ADVIL,MOTRIN) 800 MG tablet  Take 1 tablet (800 mg total) by mouth every 8 (eight) hours as needed for moderate pain.  Marland Kitchen lisinopril-hydrochlorothiazide (PRINZIDE,ZESTORETIC) 10-12.5 MG tablet Take 1 tablet by mouth daily.  . Melatonin 3 MG TABS Take by mouth.  . betamethasone dipropionate (DIPROLENE) 0.05 % cream Apply topically 2 (two) times daily.  . cyclobenzaprine (FLEXERIL) 10 MG tablet Take 1 tablet (10 mg total) by mouth 3 (three) times daily as needed for muscle spasms.  . SUMAtriptan (IMITREX) 50 MG tablet Take 1 tablet (50 mg total) by mouth every 2 (two) hours as needed for migraine. May repeat in 2 hours if headache persists or recurs.   No facility-administered encounter medications on file as of 01/24/2017.     No Known  Allergies  Review of Systems  Constitutional: Negative for activity change, appetite change and unexpected weight change.  HENT: Negative for congestion and dental problem.   Eyes: Negative for discharge and visual disturbance.  Respiratory: Negative for cough and shortness of breath.   Cardiovascular: Negative for chest pain, palpitations and leg swelling.  Gastrointestinal: Negative for abdominal pain, constipation and diarrhea.  Genitourinary: Negative for dysuria and frequency.  Musculoskeletal: Positive for back pain. Negative for arthralgias.       Chronic with radiating left leg pain  Skin: Positive for wound.       thumb  Neurological: Positive for headaches. Negative for dizziness, speech difficulty, light-headedness and numbness.       Increased frequency.  Hematological: Negative for adenopathy. Does not bruise/bleed easily.  Psychiatric/Behavioral: Positive for dysphoric mood. Negative for self-injury, sleep disturbance and suicidal ideas. The patient is not nervous/anxious.     BP 138/84   Pulse 88   Temp 98.9 F (37.2 C) (Temporal)   Resp 18   Ht 5\' 11"  (1.803 m)   Wt 295 lb 1.9 oz (133.9 kg)   SpO2 98%   BMI 41.16 kg/m   Physical Exam  Constitutional: He is oriented to person, place, and time. He appears well-developed and well-nourished.  Obese.  Sad affect.  HENT:  Head: Normocephalic and atraumatic.  Eyes: EOM are normal. Pupils are equal, round, and reactive to light.  Neck: Normal range of motion. Neck supple. No neck rigidity.  Cardiovascular: Normal rate, regular rhythm and normal heart sounds.  Pulmonary/Chest: Effort normal and breath sounds normal.  Abdominal: Soft. Bowel sounds are normal.  Obese abdomen  Neurological: He is alert and oriented to person, place, and time. He has normal strength. He displays normal reflexes. No cranial nerve deficit. Coordination and gait normal.  Skin:  Paronychia left thumb, partial lifting of the nail bed  adjacent, malodorous, no drainage    ASSESSMENT/PLAN:  1. 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 Bipolar 1 disorder, depressed (HCC) Referred for treatment - Ambulatory referral to Psychiatry  3. Chronic back pain greater than 3 months duration Tapered gabapentin.  Continue ibuprofen.  Add Flexeril as needed.  Discussed referral to pain clinic for nonnarcotic pain management  4. Migraine without aura and without status migrainosus, not intractable Trial of triptan   Patient Instructions  Use the cream 2 X a day to the thumb  Take the flexeril as needed for back pain  May reduce the gabapentin by half every week  I have referred to psychiatry  Take sumatriptan at the first sign of a headache  See me in A MONTH   Eustace Moore, MD

## 2017-01-24 NOTE — Patient Instructions (Addendum)
Use the cream 2 X a day to the thumb  Take the flexeril as needed for back pain  May reduce the gabapentin by half every week  I have referred to psychiatry  Take sumatriptan at the first sign of a headache  See me in A MONTH
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
## 2017-02-12 ENCOUNTER — Other Ambulatory Visit: Payer: Self-pay | Admitting: Family Medicine

## 2017-02-14 ENCOUNTER — Encounter (HOSPITAL_COMMUNITY): Payer: Self-pay | Admitting: Psychiatry

## 2017-02-14 ENCOUNTER — Ambulatory Visit (HOSPITAL_COMMUNITY): Payer: BLUE CROSS/BLUE SHIELD | Admitting: Psychiatry

## 2017-02-14 VITALS — BP 158/89 | HR 102 | Ht 71.0 in | Wt 292.0 lb

## 2017-02-14 DIAGNOSIS — F419 Anxiety disorder, unspecified: Secondary | ICD-10-CM | POA: Diagnosis not present

## 2017-02-14 DIAGNOSIS — F329 Major depressive disorder, single episode, unspecified: Secondary | ICD-10-CM | POA: Diagnosis not present

## 2017-02-14 DIAGNOSIS — F1021 Alcohol dependence, in remission: Secondary | ICD-10-CM | POA: Diagnosis not present

## 2017-02-14 DIAGNOSIS — Z811 Family history of alcohol abuse and dependence: Secondary | ICD-10-CM

## 2017-02-14 DIAGNOSIS — F1010 Alcohol abuse, uncomplicated: Secondary | ICD-10-CM | POA: Insufficient documentation

## 2017-02-14 DIAGNOSIS — F0630 Mood disorder due to known physiological condition, unspecified: Secondary | ICD-10-CM | POA: Insufficient documentation

## 2017-02-14 DIAGNOSIS — R450 Nervousness: Secondary | ICD-10-CM

## 2017-02-14 DIAGNOSIS — Z818 Family history of other mental and behavioral disorders: Secondary | ICD-10-CM

## 2017-02-14 DIAGNOSIS — Z813 Family history of other psychoactive substance abuse and dependence: Secondary | ICD-10-CM

## 2017-02-14 DIAGNOSIS — F1421 Cocaine dependence, in remission: Secondary | ICD-10-CM | POA: Diagnosis not present

## 2017-02-14 MED ORDER — ARIPIPRAZOLE 5 MG PO TABS: 5.0000 mg | ORAL_TABLET | Freq: Every day | ORAL | 0 refills | Status: DC

## 2017-02-14 NOTE — Patient Instructions (Addendum)
1.  Start Abilify 5 mg daily  2. Return to clinic in one month for 30 mins 3. Check TSH

## 2017-02-15 ENCOUNTER — Other Ambulatory Visit: Payer: Self-pay | Admitting: Family Medicine

## 2017-02-15 LAB — TSH: TSH: 2.31 m[IU]/L (ref 0.40–4.50)

## 2017-03-04 ENCOUNTER — Other Ambulatory Visit: Payer: Self-pay

## 2017-03-04 ENCOUNTER — Encounter: Payer: Self-pay | Admitting: Family Medicine

## 2017-03-04 ENCOUNTER — Ambulatory Visit: Payer: BLUE CROSS/BLUE SHIELD | Admitting: Family Medicine

## 2017-03-04 VITALS — BP 156/84 | HR 96 | Temp 98.0°F | Resp 20 | Ht 71.0 in | Wt 297.0 lb

## 2017-03-04 DIAGNOSIS — F0630 Mood disorder due to known physiological condition, unspecified: Secondary | ICD-10-CM

## 2017-03-04 DIAGNOSIS — L03012 Cellulitis of left finger: Secondary | ICD-10-CM

## 2017-03-04 DIAGNOSIS — G8929 Other chronic pain: Secondary | ICD-10-CM

## 2017-03-04 DIAGNOSIS — I10 Essential (primary) hypertension: Secondary | ICD-10-CM

## 2017-03-04 DIAGNOSIS — M549 Dorsalgia, unspecified: Secondary | ICD-10-CM | POA: Diagnosis not present

## 2017-03-04 DIAGNOSIS — G43009 Migraine without aura, not intractable, without status migrainosus: Secondary | ICD-10-CM | POA: Diagnosis not present

## 2017-03-04 DIAGNOSIS — E781 Pure hyperglyceridemia: Secondary | ICD-10-CM | POA: Diagnosis not present

## 2017-03-04 MED ORDER — CYCLOBENZAPRINE HCL 10 MG PO TABS: 10.0000 mg | ORAL_TABLET | Freq: Three times a day (TID) | ORAL | 0 refills | Status: DC | PRN

## 2017-03-04 NOTE — Progress Notes (Signed)
Chief Complaint  Patient presents with  . Follow-up    3 month, HA, lt thumb   Patient is here for routine follow-up. He states that the Imitrex did not help with his headaches, but the Flexeril did.  I told him it is likely that he has muscle tension headaches.  I refilled his Flexeril for as needed use for recurring headache problems. He did go to see Dr. Vanetta Shawl in psychiatry for his bipolar depression.  He was started on Abilify.  He states it was terribly expensive.  He states that he could not sleep when he took the medication.  After 2 weeks of daily use he felt worse instead of better so stopped the medication.  He did not notify Dr. Vanetta Shawl.  He does not feel like he would go back for follow-up.  I have encouraged him to contact Dr. Bing Matter office with this information and to try an alternate medication. He still has paronychia in his thumb.  It is bleeding and irritated.  He picks at it all the time.  Going to refer him to a hand surgeon to see if anything else can be done for him. His blood pressure is not well controlled.  He feels it is because of his "stress" and sleep deprivation.  Again, he needs to get back in with psychiatry so that he feels better. He has not lost weight.  We discussed the importance of a low-fat diet, and regular exercise to try to control his weight.  With his chronic low back pain he finds this difficult.  Patient Active Problem List   Diagnosis Date Noted  . Mood disorder in conditions classified elsewhere 02/14/2017  . Alcohol use disorder, severe, in sustained remission (HCC) 02/14/2017  . Cocaine use disorder, severe, in sustained remission (HCC) 02/14/2017  . Chronic back pain greater than 3 months duration 01/24/2017  . Migraine headache without aura 01/24/2017  . Paronychia of left thumb 06/11/2016  . History of prior cigarette smoking 05/14/2016  . Substance abuse in remission (HCC) 05/14/2016  . Alcohol abuse, in remission 05/14/2016  .  Pincer nail deformity 05/14/2016  . Class 2 obesity with body mass index (BMI) of 39.0 to 39.9 in adult 05/14/2016  . Hypertriglyceridemia 04/25/2010  . Metabolic syndrome 04/25/2010  . HTN (hypertension) 04/25/2010  . Bipolar 1 disorder (HCC) 04/25/2010    Outpatient Encounter Medications as of 03/04/2017  Medication Sig  . acetaminophen (TYLENOL) 325 MG tablet Take 650 mg by mouth every 6 (six) hours as needed.  Marland Kitchen atorvastatin (LIPITOR) 40 MG tablet Take 1 tablet (40 mg total) by mouth daily.  . betamethasone dipropionate (DIPROLENE) 0.05 % cream Apply topically 2 (two) times daily.  . cyclobenzaprine (FLEXERIL) 10 MG tablet Take 1 tablet (10 mg total) by mouth 3 (three) times daily as needed for muscle spasms.  Marland Kitchen ibuprofen (ADVIL,MOTRIN) 200 MG tablet Take 200 mg by mouth every 6 (six) hours as needed.  Marland Kitchen ibuprofen (ADVIL,MOTRIN) 800 MG tablet TAKE 1 TABLET BY MOUTH EVERY 8 HOURS AS NEEDED FOR MODERATE PAIN  . lisinopril-hydrochlorothiazide (PRINZIDE,ZESTORETIC) 10-12.5 MG tablet Take 1 tablet by mouth daily.  . Melatonin 3 MG TABS Take by mouth.   No facility-administered encounter medications on file as of 03/04/2017.     No Known Allergies  Review of Systems  Constitutional: Negative for activity change, appetite change and unexpected weight change.  HENT: Negative for congestion and dental problem.   Eyes: Negative for discharge and visual disturbance.  Respiratory: Negative for cough and shortness of breath.   Cardiovascular: Negative for chest pain, palpitations and leg swelling.  Gastrointestinal: Negative for abdominal pain, constipation and diarrhea.  Genitourinary: Negative for dysuria and frequency.  Musculoskeletal: Positive for back pain. Negative for arthralgias.       Chronic with radiating left leg pain  Skin: Positive for wound.       thumb  Neurological: Negative for dizziness, speech difficulty, light-headedness and numbness.       Improved, now sporadic    Hematological: Negative for adenopathy. Does not bruise/bleed easily.  Psychiatric/Behavioral: Positive for dysphoric mood and sleep disturbance. Negative for self-injury and suicidal ideas. The patient is not nervous/anxious.     BP (!) 156/84 (BP Location: Left Arm, Patient Position: Sitting, Cuff Size: Large)   Pulse 96   Temp 98 F (36.7 C) (Temporal)   Resp 20   Ht 5\' 11"  (1.803 m)   Wt 297 lb 0.6 oz (134.7 kg)   SpO2 98%   BMI 41.43 kg/m   Physical Exam  Constitutional: He is oriented to person, place, and time. He appears well-developed and well-nourished. No distress.  HENT:  Head: Normocephalic and atraumatic.  Eyes: EOM are normal. Pupils are equal, round, and reactive to light.  Neck: Normal range of motion. Neck supple. No neck rigidity.  Cardiovascular: Normal rate, regular rhythm and normal heart sounds.  Pulmonary/Chest: Effort normal and breath sounds normal.  Abdominal: Soft. Bowel sounds are normal.  Obese abdomen  Neurological: He is alert and oriented to person, place, and time. He has normal strength. He displays normal reflexes. No cranial nerve deficit. Coordination and gait normal.  Skin:  Paronychia left thumb, partial lifting of the nail bed adjacent,  no drainage.  Currently with slight bleeding secondary to picking the edge    ASSESSMENT/PLAN:  1. 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 Chronic back pain greater than 3 months duration Managed without narcotics  3. Migraine without aura and without status migrainosus, not intractable Migraine likely misdiagnosed, sounds more like muscle tension headache  4. Paronychia of left thumb Chronic  5. Mood disorder in conditions classified elsewhere Noncompliant with treatment and therapy recommendations  6. Hypertriglyceridemia Discussed with patient.  His father currently has pancreatitis.  I did tell him that if he does not get his triglycerides under control that he is going  to risk of pancreatitis and other health complications.  7. Essential hypertension Not well controlled.  Patient wants to try diet exercise and salt reduction rather than increasing his medication.  Will follow   Patient Instructions  I will investigate a hand surgeon for you Your BP is good Continue to watch the triglycerides in your diet No change in your medicine See me in 3 months  Food Choices to Lower Your Triglycerides Triglycerides are a type of fat in your blood. High levels of triglycerides can increase the risk of heart disease and stroke. If your triglyceride levels are high, the foods you eat and your eating habits are very important. Choosing the right foods can help lower your triglycerides. What general guidelines do I need to follow?  Lose weight if you are overweight.  Limit or avoid alcohol.  Fill one half of your plate with vegetables and green salads.  Limit fruit to two servings a day. Choose fruit instead of juice.  Make one fourth of your plate whole grains. Look for the word "whole" as the first word in the ingredient  list.  Fill one fourth of your plate with lean protein foods.  Enjoy fatty fish (such as salmon, mackerel, sardines, and tuna) three times a week.  Choose healthy fats.  Limit foods high in starch and sugar.  Eat more home-cooked food and less restaurant, buffet, and fast food.  Limit fried foods.  Cook foods using methods other than frying.  Limit saturated fats.  Check ingredient lists to avoid foods with partially hydrogenated oils (trans fats) in them. What foods can I eat? Grains Whole grains, such as whole wheat or whole grain breads, crackers, cereals, and pasta. Unsweetened oatmeal, bulgur, barley, quinoa, or brown rice. Corn or whole wheat flour tortillas. Vegetables Fresh or frozen vegetables (raw, steamed, roasted, or grilled). Green salads. Fruits All fresh, canned (in natural juice), or frozen fruits. Meat and  Other Protein Products Ground beef (85% or leaner), grass-fed beef, or beef trimmed of fat. Skinless chicken or [place]. Ground chicken or [place]. Pork trimmed of fat. All fish and seafood. Eggs. Dried beans, peas, or lentils. Unsalted nuts or seeds. Unsalted canned or dry beans. Dairy Low-fat dairy products, such as skim or 1% milk, 2% or reduced-fat cheeses, low-fat ricotta or cottage cheese, or plain low-fat yogurt. Fats and Oils Tub margarines without trans fats. Light or reduced-fat mayonnaise and salad dressings. Avocado. Safflower, olive, or canola oils. Natural peanut or almond butter. The items listed above may not be a complete list of recommended foods or beverages. Contact your dietitian for more options. What foods are not recommended? Grains White bread. White pasta. White rice. Cornbread. Bagels, pastries, and croissants. Crackers that contain trans fat. Vegetables White potatoes. Corn. Creamed or fried vegetables. Vegetables in a cheese sauce. Fruits Dried fruits. Canned fruit in light or heavy syrup. Fruit juice. Meat and Other Protein Products Fatty cuts of meat. Ribs, chicken wings, bacon, sausage, bologna, salami, chitterlings, fatback, hot dogs, bratwurst, and packaged luncheon meats. Dairy Whole or 2% milk, cream, half-and-half, and cream cheese. Whole-fat or sweetened yogurt. Full-fat cheeses. Nondairy creamers and whipped toppings. Processed cheese, cheese spreads, or cheese curds. Sweets and Desserts Corn syrup, sugars, honey, and molasses. Candy. Jam and jelly. Syrup. Sweetened cereals. Cookies, pies, cakes, donuts, muffins, and ice cream. Fats and Oils Butter, stick margarine, lard, shortening, ghee, or bacon fat. Coconut, palm kernel, or palm oils. Beverages Alcohol. Sweetened drinks (such as sodas, lemonade, and fruit drinks or punches). The items listed above may not be a complete list of foods and beverages to avoid. Contact your dietitian for more  information. This information is not intended to replace advice given to you by your health care provider. Make sure you discuss any questions you have with your health care provider. Document Released: 10/27/2003 Document Revised: 06/16/2015 Document Reviewed: 11/12/2012 Elsevier Interactive Patient Education  2017 [employer]    Eustace Moore, MD

## 2017-03-04 NOTE — Patient Instructions (Addendum)
I will investigate a hand surgeon for you Your BP is good Continue to watch the triglycerides in your diet No change in your medicine See me in 3 months  Food Choices to Lower Your Triglycerides Triglycerides are a type of fat in your blood. High levels of triglycerides can increase the risk of heart disease and stroke. If your triglyceride levels are high, the foods you eat and your eating habits are very important. Choosing the right foods can help lower your triglycerides. What general guidelines do I need to follow?  Lose weight if you are overweight.  Limit or avoid alcohol.  Fill one half of your plate with vegetables and green salads.  Limit fruit to two servings a day. Choose fruit instead of juice.  Make one fourth of your plate whole grains. Look for the word "whole" as the first word in the ingredient list.  Fill one fourth of your plate with lean protein foods.  Enjoy fatty fish (such as salmon, mackerel, sardines, and tuna) three times a week.  Choose healthy fats.  Limit foods high in starch and sugar.  Eat more home-cooked food and less restaurant, buffet, and fast food.  Limit fried foods.  Cook foods using methods other than frying.  Limit saturated fats.  Check ingredient lists to avoid foods with partially hydrogenated oils (trans fats) in them. What foods can I eat? Grains Whole grains, such as whole wheat or whole grain breads, crackers, cereals, and pasta. Unsweetened oatmeal, bulgur, barley, quinoa, or brown rice. Corn or whole wheat flour tortillas. Vegetables Fresh or frozen vegetables (raw, steamed, roasted, or grilled). Green salads. Fruits All fresh, canned (in natural juice), or frozen fruits. Meat and Other Protein Products Ground beef (85% or leaner), grass-fed beef, or beef trimmed of fat. Skinless chicken or [place]. Ground chicken or [place]. Pork trimmed of fat. All fish and seafood. Eggs. Dried beans, peas, or lentils. Unsalted nuts or  seeds. Unsalted canned or dry beans. Dairy Low-fat dairy products, such as skim or 1% milk, 2% or reduced-fat cheeses, low-fat ricotta or cottage cheese, or plain low-fat yogurt. Fats and Oils Tub margarines without trans fats. Light or reduced-fat mayonnaise and salad dressings. Avocado. Safflower, olive, or canola oils. Natural peanut or almond butter. The items listed above may not be a complete list of recommended foods or beverages. Contact your dietitian for more options. What foods are not recommended? Grains White bread. White pasta. White rice. Cornbread. Bagels, pastries, and croissants. Crackers that contain trans fat. Vegetables White potatoes. Corn. Creamed or fried vegetables. Vegetables in a cheese sauce. Fruits Dried fruits. Canned fruit in light or heavy syrup. Fruit juice. Meat and Other Protein Products Fatty cuts of meat. Ribs, chicken wings, bacon, sausage, bologna, salami, chitterlings, fatback, hot dogs, bratwurst, and packaged luncheon meats. Dairy Whole or 2% milk, cream, half-and-half, and cream cheese. Whole-fat or sweetened yogurt. Full-fat cheeses. Nondairy creamers and whipped toppings. Processed cheese, cheese spreads, or cheese curds. Sweets and Desserts Corn syrup, sugars, honey, and molasses. Candy. Jam and jelly. Syrup. Sweetened cereals. Cookies, pies, cakes, donuts, muffins, and ice cream. Fats and Oils Butter, stick margarine, lard, shortening, ghee, or bacon fat. Coconut, palm kernel, or palm oils. Beverages Alcohol. Sweetened drinks (such as sodas, lemonade, and fruit drinks or punches). The items listed above may not be a complete list of foods and beverages to avoid. Contact your dietitian for more information. This information is not intended to replace advice given to you by your health care  provider. Make sure you discuss any questions you have with your health care provider. Document Released: 10/27/2003 Document Revised: 06/16/2015 Document  Reviewed: 11/12/2012 Elsevier Interactive Patient Education  2017 [employer].

## 2017-03-19 ENCOUNTER — Ambulatory Visit (HOSPITAL_COMMUNITY): Payer: BLUE CROSS/BLUE SHIELD | Admitting: Psychiatry

## 2017-04-01 ENCOUNTER — Encounter: Payer: Self-pay | Admitting: Family Medicine

## 2017-06-04 ENCOUNTER — Ambulatory Visit: Payer: Self-pay | Admitting: Family Medicine

## 2017-07-12 DIAGNOSIS — G44229 Chronic tension-type headache, not intractable: Secondary | ICD-10-CM | POA: Diagnosis not present

## 2017-07-12 DIAGNOSIS — F319 Bipolar disorder, unspecified: Secondary | ICD-10-CM | POA: Diagnosis not present

## 2017-07-12 DIAGNOSIS — I10 Essential (primary) hypertension: Secondary | ICD-10-CM | POA: Diagnosis not present

## 2017-07-12 DIAGNOSIS — E785 Hyperlipidemia, unspecified: Secondary | ICD-10-CM | POA: Diagnosis not present

## 2017-07-22 DIAGNOSIS — L608 Other nail disorders: Secondary | ICD-10-CM | POA: Diagnosis not present

## 2017-08-05 DIAGNOSIS — R600 Localized edema: Secondary | ICD-10-CM | POA: Diagnosis not present

## 2017-08-05 DIAGNOSIS — L608 Other nail disorders: Secondary | ICD-10-CM | POA: Diagnosis not present

## 2017-08-05 DIAGNOSIS — L62 Nail disorders in diseases classified elsewhere: Secondary | ICD-10-CM | POA: Diagnosis not present

## 2017-08-05 DIAGNOSIS — Z1389 Encounter for screening for other disorder: Secondary | ICD-10-CM | POA: Diagnosis not present

## 2017-08-05 DIAGNOSIS — Z135 Encounter for screening for eye and ear disorders: Secondary | ICD-10-CM | POA: Diagnosis not present

## 2017-08-07 DIAGNOSIS — L608 Other nail disorders: Secondary | ICD-10-CM | POA: Diagnosis not present

## 2017-08-09 ENCOUNTER — Other Ambulatory Visit: Payer: Self-pay | Admitting: Orthopedic Surgery

## 2017-08-13 DIAGNOSIS — E785 Hyperlipidemia, unspecified: Secondary | ICD-10-CM | POA: Diagnosis not present

## 2017-08-13 DIAGNOSIS — Z6841 Body Mass Index (BMI) 40.0 and over, adult: Secondary | ICD-10-CM | POA: Diagnosis not present

## 2017-08-13 DIAGNOSIS — I10 Essential (primary) hypertension: Secondary | ICD-10-CM | POA: Diagnosis not present

## 2017-08-15 ENCOUNTER — Encounter (HOSPITAL_BASED_OUTPATIENT_CLINIC_OR_DEPARTMENT_OTHER): Payer: Self-pay | Admitting: *Deleted

## 2017-08-15 ENCOUNTER — Other Ambulatory Visit: Payer: Self-pay

## 2017-08-15 NOTE — Pre-Procedure Instructions (Signed)
Pt is coming Monday or Tuesday for BMET and EKG. Bring all medications.

## 2017-08-20 ENCOUNTER — Encounter (HOSPITAL_BASED_OUTPATIENT_CLINIC_OR_DEPARTMENT_OTHER)
Admission: RE | Admit: 2017-08-20 | Discharge: 2017-08-20 | Disposition: A | Discharge status: Home / Self Care | Payer: BLUE CROSS/BLUE SHIELD | Source: Ambulatory Visit | Attending: Orthopedic Surgery | Admitting: Orthopedic Surgery

## 2017-08-20 DIAGNOSIS — Z01810 Encounter for preprocedural cardiovascular examination: Secondary | ICD-10-CM | POA: Diagnosis not present

## 2017-08-20 DIAGNOSIS — Z01812 Encounter for preprocedural laboratory examination: Secondary | ICD-10-CM | POA: Diagnosis not present

## 2017-08-22 ENCOUNTER — Other Ambulatory Visit: Payer: Self-pay

## 2017-08-22 ENCOUNTER — Ambulatory Visit (HOSPITAL_BASED_OUTPATIENT_CLINIC_OR_DEPARTMENT_OTHER)
Admission: RE | Admit: 2017-08-22 | Discharge: 2017-08-22 | Disposition: A | Discharge status: Home / Self Care | Payer: BLUE CROSS/BLUE SHIELD | Source: Ambulatory Visit | Attending: Orthopedic Surgery | Admitting: Orthopedic Surgery

## 2017-08-22 ENCOUNTER — Encounter (HOSPITAL_BASED_OUTPATIENT_CLINIC_OR_DEPARTMENT_OTHER)
Admission: RE | Disposition: A | Discharge status: Home / Self Care | Payer: Self-pay | Source: Ambulatory Visit | Attending: Orthopedic Surgery

## 2017-08-22 ENCOUNTER — Ambulatory Visit (HOSPITAL_BASED_OUTPATIENT_CLINIC_OR_DEPARTMENT_OTHER): Payer: BLUE CROSS/BLUE SHIELD | Admitting: Anesthesiology

## 2017-08-22 ENCOUNTER — Encounter (HOSPITAL_BASED_OUTPATIENT_CLINIC_OR_DEPARTMENT_OTHER): Payer: Self-pay | Admitting: Anesthesiology

## 2017-08-22 DIAGNOSIS — I10 Essential (primary) hypertension: Secondary | ICD-10-CM | POA: Insufficient documentation

## 2017-08-22 DIAGNOSIS — M1812 Unilateral primary osteoarthritis of first carpometacarpal joint, left hand: Secondary | ICD-10-CM | POA: Insufficient documentation

## 2017-08-22 DIAGNOSIS — Z87891 Personal history of nicotine dependence: Secondary | ICD-10-CM | POA: Diagnosis not present

## 2017-08-22 DIAGNOSIS — Z6841 Body Mass Index (BMI) 40.0 and over, adult: Secondary | ICD-10-CM | POA: Insufficient documentation

## 2017-08-22 DIAGNOSIS — E785 Hyperlipidemia, unspecified: Secondary | ICD-10-CM | POA: Diagnosis not present

## 2017-08-22 DIAGNOSIS — L608 Other nail disorders: Secondary | ICD-10-CM | POA: Diagnosis not present

## 2017-08-22 DIAGNOSIS — M67442 Ganglion, left hand: Secondary | ICD-10-CM | POA: Diagnosis not present

## 2017-08-22 DIAGNOSIS — M20002 Unspecified deformity of left finger(s): Secondary | ICD-10-CM | POA: Diagnosis not present

## 2017-08-22 DIAGNOSIS — F319 Bipolar disorder, unspecified: Secondary | ICD-10-CM | POA: Diagnosis not present

## 2017-08-22 DIAGNOSIS — Z8249 Family history of ischemic heart disease and other diseases of the circulatory system: Secondary | ICD-10-CM | POA: Diagnosis not present

## 2017-08-22 DIAGNOSIS — F419 Anxiety disorder, unspecified: Secondary | ICD-10-CM | POA: Diagnosis not present

## 2017-08-22 DIAGNOSIS — Z79899 Other long term (current) drug therapy: Secondary | ICD-10-CM | POA: Diagnosis not present

## 2017-08-22 DIAGNOSIS — M25742 Osteophyte, left hand: Secondary | ICD-10-CM | POA: Diagnosis not present

## 2017-08-22 DIAGNOSIS — M71342 Other bursal cyst, left hand: Secondary | ICD-10-CM | POA: Diagnosis not present

## 2017-08-22 HISTORY — DX: Bipolar disorder, unspecified: F31.9

## 2017-08-22 HISTORY — PX: EXCISION MASS UPPER EXTREMETIES: SHX6704

## 2017-08-22 LAB — POCT I-STAT, CHEM 8
BUN: 13 mg/dL (ref 6–20)
CREATININE: 0.9 mg/dL (ref 0.61–1.24)
Calcium, Ion: 1.28 mmol/L (ref 1.15–1.40)
Chloride: 103 mmol/L (ref 98–111)
GLUCOSE: 106 mg/dL — AB (ref 70–99)
HCT: 37 % — ABNORMAL LOW (ref 39.0–52.0)
Hemoglobin: 12.6 g/dL — ABNORMAL LOW (ref 13.0–17.0)
POTASSIUM: 4.2 mmol/L (ref 3.5–5.1)
Sodium: 137 mmol/L (ref 135–145)
TCO2: 23 mmol/L (ref 22–32)

## 2017-08-22 SURGERY — EXCISION MASS UPPER EXTREMITIES
Anesthesia: Regional | Site: Thumb | Laterality: Left

## 2017-08-22 MED ORDER — SCOPOLAMINE 1 MG/3DAYS TD PT72: 1.0000 | MEDICATED_PATCH | Freq: Once | TRANSDERMAL | Status: DC | PRN

## 2017-08-22 MED ORDER — LIDOCAINE HCL (PF) 0.5 % IJ SOLN
INTRAMUSCULAR | Status: DC | PRN
  Administered 2017-08-22: 30 mL via INTRAVENOUS

## 2017-08-22 MED ORDER — CEFAZOLIN SODIUM-DEXTROSE 2-4 GM/100ML-% IV SOLN
2.0000 g | INTRAVENOUS | Status: AC
  Administered 2017-08-22: 2 g via INTRAVENOUS

## 2017-08-22 MED ORDER — FENTANYL CITRATE (PF) 100 MCG/2ML IJ SOLN
INTRAMUSCULAR | Status: AC
  Filled 2017-08-22: qty 2

## 2017-08-22 MED ORDER — FENTANYL CITRATE (PF) 100 MCG/2ML IJ SOLN: 50.0000 ug | INTRAMUSCULAR | Status: DC | PRN

## 2017-08-22 MED ORDER — PROPOFOL 10 MG/ML IV BOLUS
INTRAVENOUS | Status: AC
  Filled 2017-08-22: qty 20

## 2017-08-22 MED ORDER — LIDOCAINE HCL (CARDIAC) PF 100 MG/5ML IV SOSY
PREFILLED_SYRINGE | INTRAVENOUS | Status: AC
  Filled 2017-08-22: qty 5

## 2017-08-22 MED ORDER — LACTATED RINGERS IV SOLN
INTRAVENOUS | Status: DC
  Administered 2017-08-22 (×2): via INTRAVENOUS

## 2017-08-22 MED ORDER — FENTANYL CITRATE (PF) 100 MCG/2ML IJ SOLN
INTRAMUSCULAR | Status: DC | PRN
  Administered 2017-08-22 (×2): 25 ug via INTRAVENOUS
  Administered 2017-08-22: 100 ug via INTRAVENOUS
  Administered 2017-08-22 (×2): 25 ug via INTRAVENOUS

## 2017-08-22 MED ORDER — DEXAMETHASONE SODIUM PHOSPHATE 10 MG/ML IJ SOLN
INTRAMUSCULAR | Status: AC
  Filled 2017-08-22: qty 1

## 2017-08-22 MED ORDER — ONDANSETRON HCL 4 MG/2ML IJ SOLN
INTRAMUSCULAR | Status: DC | PRN
  Administered 2017-08-22: 4 mg via INTRAVENOUS

## 2017-08-22 MED ORDER — CEFAZOLIN SODIUM-DEXTROSE 2-4 GM/100ML-% IV SOLN
INTRAVENOUS | Status: AC
  Filled 2017-08-22: qty 100

## 2017-08-22 MED ORDER — CELECOXIB 200 MG PO CAPS: 200.0000 mg | ORAL_CAPSULE | Freq: Two times a day (BID) | ORAL | 2 refills | Status: AC

## 2017-08-22 MED ORDER — MIDAZOLAM HCL 2 MG/2ML IJ SOLN: 1.0000 mg | INTRAMUSCULAR | Status: DC | PRN

## 2017-08-22 MED ORDER — MIDAZOLAM HCL 5 MG/5ML IJ SOLN
INTRAMUSCULAR | Status: DC | PRN
  Administered 2017-08-22: 2 mg via INTRAVENOUS

## 2017-08-22 MED ORDER — CHLORHEXIDINE GLUCONATE 4 % EX LIQD: 60.0000 mL | Freq: Once | CUTANEOUS | Status: DC

## 2017-08-22 MED ORDER — PROPOFOL 500 MG/50ML IV EMUL
INTRAVENOUS | Status: DC | PRN
  Administered 2017-08-22: 25 ug/kg/min via INTRAVENOUS

## 2017-08-22 MED ORDER — LACTATED RINGERS IV SOLN: 500.0000 mL | INTRAVENOUS | Status: DC

## 2017-08-22 MED ORDER — ONDANSETRON HCL 4 MG/2ML IJ SOLN
INTRAMUSCULAR | Status: AC
  Filled 2017-08-22: qty 2

## 2017-08-22 MED ORDER — MIDAZOLAM HCL 2 MG/2ML IJ SOLN
INTRAMUSCULAR | Status: AC
  Filled 2017-08-22: qty 2

## 2017-08-22 MED ORDER — BUPIVACAINE HCL (PF) 0.25 % IJ SOLN
INTRAMUSCULAR | Status: DC | PRN
  Administered 2017-08-22: 9 mL

## 2017-08-22 SURGICAL SUPPLY — 57 items
BANDAGE COBAN STERILE 2 (GAUZE/BANDAGES/DRESSINGS) IMPLANT
BLADE MINI RND TIP GREEN BEAV (BLADE) ×1 IMPLANT
BLADE SURG 15 STRL LF DISP TIS (BLADE) ×1 IMPLANT
BLADE SURG 15 STRL SS (BLADE) ×2
BNDG CMPR 9X4 STRL LF SNTH (GAUZE/BANDAGES/DRESSINGS)
BNDG COHESIVE 1X5 TAN STRL LF (GAUZE/BANDAGES/DRESSINGS) ×1 IMPLANT
BNDG COHESIVE 3X5 TAN STRL LF (GAUZE/BANDAGES/DRESSINGS) IMPLANT
BNDG ESMARK 4X9 LF (GAUZE/BANDAGES/DRESSINGS) IMPLANT
BNDG GAUZE ELAST 4 BULKY (GAUZE/BANDAGES/DRESSINGS) IMPLANT
CHLORAPREP W/TINT 26ML (MISCELLANEOUS) ×2 IMPLANT
CORD BIPOLAR FORCEPS 12FT (ELECTRODE) ×2 IMPLANT
COVER BACK TABLE 60X90IN (DRAPES) ×2 IMPLANT
COVER MAYO STAND STRL (DRAPES) ×2 IMPLANT
CUFF TOURNIQUET SINGLE 18IN (TOURNIQUET CUFF) ×1 IMPLANT
DECANTER SPIKE VIAL GLASS SM (MISCELLANEOUS) IMPLANT
DRAIN PENROSE 1/2X12 LTX STRL (WOUND CARE) IMPLANT
DRAIN PENROSE 1/4X12 LTX STRL (WOUND CARE) ×1 IMPLANT
DRAPE EXTREMITY T 121X128X90 (DRAPE) ×2 IMPLANT
DRAPE SURG 17X23 STRL (DRAPES) ×2 IMPLANT
GAUZE SPONGE 4X4 12PLY STRL (GAUZE/BANDAGES/DRESSINGS) ×2 IMPLANT
GAUZE XEROFORM 1X8 LF (GAUZE/BANDAGES/DRESSINGS) ×2 IMPLANT
GLOVE BIOGEL PI IND STRL 6.5 (GLOVE) IMPLANT
GLOVE BIOGEL PI IND STRL 7.0 (GLOVE) IMPLANT
GLOVE BIOGEL PI IND STRL 8.5 (GLOVE) ×1 IMPLANT
GLOVE BIOGEL PI INDICATOR 6.5 (GLOVE) ×1
GLOVE BIOGEL PI INDICATOR 7.0 (GLOVE) ×1
GLOVE BIOGEL PI INDICATOR 8.5 (GLOVE) ×1
GLOVE SURG ORTHO 8.0 STRL STRW (GLOVE) ×2 IMPLANT
GLOVE SURG SYN 7.5  E (GLOVE) ×1
GLOVE SURG SYN 7.5 E (GLOVE) ×1 IMPLANT
GLOVE SURG SYN 7.5 PF PI (GLOVE) IMPLANT
GOWN STRL REUS W/ TWL LRG LVL3 (GOWN DISPOSABLE) ×1 IMPLANT
GOWN STRL REUS W/TWL LRG LVL3 (GOWN DISPOSABLE) ×2
GOWN STRL REUS W/TWL XL LVL3 (GOWN DISPOSABLE) ×2 IMPLANT
NDL PRECISIONGLIDE 27X1.5 (NEEDLE) IMPLANT
NDL SAFETY ECLIPSE 18X1.5 (NEEDLE) ×1 IMPLANT
NEEDLE HYPO 18GX1.5 SHARP (NEEDLE) ×2
NEEDLE PRECISIONGLIDE 27X1.5 (NEEDLE) ×2 IMPLANT
NS IRRIG 1000ML POUR BTL (IV SOLUTION) ×2 IMPLANT
PACK BASIN DAY SURGERY FS (CUSTOM PROCEDURE TRAY) ×2 IMPLANT
PAD CAST 3X4 CTTN HI CHSV (CAST SUPPLIES) IMPLANT
PADDING CAST ABS 3INX4YD NS (CAST SUPPLIES)
PADDING CAST ABS 4INX4YD NS (CAST SUPPLIES) ×1
PADDING CAST ABS COTTON 3X4 (CAST SUPPLIES) IMPLANT
PADDING CAST ABS COTTON 4X4 ST (CAST SUPPLIES) ×1 IMPLANT
PADDING CAST COTTON 3X4 STRL (CAST SUPPLIES)
SPLINT FINGER 4.25 BULB 911906 (SOFTGOODS) ×1 IMPLANT
SPLINT PLASTER CAST XFAST 3X15 (CAST SUPPLIES) IMPLANT
SPLINT PLASTER XTRA FASTSET 3X (CAST SUPPLIES)
STOCKINETTE 4X48 STRL (DRAPES) ×2 IMPLANT
SUT CHROMIC 6 0 PS 4 (SUTURE) ×1 IMPLANT
SUT ETHILON 4 0 PS 2 18 (SUTURE) ×2 IMPLANT
SUT VIC AB 4-0 P2 18 (SUTURE) IMPLANT
SYR BULB 3OZ (MISCELLANEOUS) ×2 IMPLANT
SYR CONTROL 10ML LL (SYRINGE) ×1 IMPLANT
TOWEL GREEN STERILE FF (TOWEL DISPOSABLE) ×4 IMPLANT
UNDERPAD 30X30 (UNDERPADS AND DIAPERS) ×2 IMPLANT

## 2017-08-22 NOTE — Anesthesia Postprocedure Evaluation (Signed)
Anesthesia Post Note  Patient: Denzil MagnusonBlake D Pulcini  Procedure(s) Performed: LEFT THUMB EXCISION MASS NAILBED WITH DEBRIDEMENT OF INTERPHALANGEAL JOINT (Left Thumb)     Patient location during evaluation: PACU Anesthesia Type: Bier Block Level of consciousness: awake and alert Pain management: pain level controlled Vital Signs Assessment: post-procedure vital signs reviewed and stable Respiratory status: spontaneous breathing, nonlabored ventilation and respiratory function stable Cardiovascular status: stable and blood pressure returned to baseline Postop Assessment: no apparent nausea or vomiting Anesthetic complications: no    Last Vitals:  Vitals:   08/22/17 1145 08/22/17 1207  BP: 130/74 124/84  Pulse: 69 72  Resp: 19 18  Temp:  36.7 C  SpO2: 99% 98%    Last Pain:  Vitals:   08/22/17 1207  TempSrc: Oral  PainSc: 0-No pain                 Lowella CurbWarren Ray Deshea Pooley

## 2017-08-22 NOTE — Brief Op Note (Signed)
08/22/2017  11:32 AM  PATIENT:  Tim [name]  52 y.o. male  PRE-OPERATIVE DIAGNOSIS:  LESION LEFT THUMB NAILBED  POST-OPERATIVE DIAGNOSIS:  LESION LEFT THUMB NAILBED  PROCEDURE:  Procedure(s): LEFT THUMB EXCISION MASS NAILBED WITH DEBRIDEMENT OF INTERPHALANGEAL JOINT (Left)  SURGEON:  Surgeon(s) and Role:    * Cindee SaltKuzma, Teaghan Melrose, MD - Primary  PHYSICIAN ASSISTANT:   ASSISTANTS: none   ANESTHESIA:   local, regional and IV sedation  EBL:  6 mL   BLOOD ADMINISTERED:none  DRAINS: none   LOCAL MEDICATIONS USED:  BUPIVICAINE   SPECIMEN:  Excision  DISPOSITION OF SPECIMEN:  PATHOLOGY  COUNTS:  YES  TOURNIQUET:   Total Tourniquet Time Documented: Forearm (Left) - 28 minutes Total: Forearm (Left) - 28 minutes   DICTATION: .Reubin Milanragon Dictation  PLAN OF CARE: Discharge to home after PACU  PATIENT DISPOSITION:  PACU - hemodynamically stable.

## 2017-08-22 NOTE — Anesthesia Procedure Notes (Signed)
Anesthesia Regional Block: Bier block (IV Regional)   Pre-Anesthetic Checklist: ,, timeout performed, Correct Patient, Correct Site, Correct Laterality, Correct Procedure, Correct Position, site marked, Risks and benefits discussed,  Surgical consent,  Pre-op evaluation,  At surgeon's request and post-op pain management  Laterality: Left  Prep: chloraprep        Narrative:  Start time: 08/22/2017 10:46 AM End time: 08/22/2017 10:48 AM  Events: blood aspirated,,,,,,,,,,

## 2017-08-22 NOTE — Anesthesia Preprocedure Evaluation (Signed)
Anesthesia Evaluation  Patient identified by MRN, date of birth, ID band Patient awake    Reviewed: Allergy & Precautions, NPO status , Patient's Chart, lab work & pertinent test results  Airway Mallampati: II  TM Distance: >3 FB Neck ROM: Full    Dental no notable dental hx.    Pulmonary neg pulmonary ROS, former smoker,    Pulmonary exam normal breath sounds clear to auscultation       Cardiovascular hypertension, Pt. on medications negative cardio ROS Normal cardiovascular exam Rhythm:Regular Rate:Normal     Neuro/Psych Anxiety Depression Bipolar Disorder negative neurological ROS  negative psych ROS   GI/Hepatic negative GI ROS, Neg liver ROS,   Endo/Other  negative endocrine ROSMorbid obesity  Renal/GU negative Renal ROS  negative genitourinary   Musculoskeletal negative musculoskeletal ROS (+)   Abdominal   Peds negative pediatric ROS (+)  Hematology negative hematology ROS (+)   Anesthesia Other Findings   Reproductive/Obstetrics negative OB ROS                             Anesthesia Physical Anesthesia Plan  ASA: III  Anesthesia Plan: Bier Block and Bier Block-LIDOCAINE ONLY   Post-op Pain Management:    Induction: Intravenous  PONV Risk Score and Plan: 1 and Ondansetron  Airway Management Planned: Simple Face Mask  Additional Equipment:   Intra-op Plan:   Post-operative Plan:   Informed Consent: I have reviewed the patients History and Physical, chart, labs and discussed the procedure including the risks, benefits and alternatives for the proposed anesthesia with the patient or authorized representative who has indicated his/her understanding and acceptance.   Dental advisory given  Plan Discussed with: CRNA  Anesthesia Plan Comments:         Anesthesia Quick Evaluation

## 2017-08-22 NOTE — Discharge Instructions (Addendum)

## 2017-08-22 NOTE — H&P (Signed)
Tim [name] is an 52 y.o. male.   Chief Complaint: nail deformity left thumb HPI: Tim [name] is a 52 year old right-hand-dominant male heard by [place] for consultation regarding a deformity to the nail plate of his left thumb. This been present for at least 2 years. He recalls no specific history of injury to it. States he has had this drained a partial removal of his nail plate. He now has a ridge and moderate pain especially the area with a VAS score of 8-10 over 10. He has not had any cultures done of this. Was placed on Cipro at one point time. He states removal of the nail plate were helped for short period of time and that it appeared to be ingrowing on the radial side. Had moderate swelling in that area since then with a area of discrete tenderness. States he frequently will attempt to get the nail elevated if it looks infected. No history of diabetes thyroid problems arthritis or gout. Family history is positive diabetes arthritis negative thyroid problems and gout.His MRI is reviewed revealing a fluid-filled lesion under his nail plate going proximally to almost the joint.          Past Medical History:  Diagnosis Date  . Anxiety   . Bipolar disorder (HCC)   . Depression   . Hyperlipidemia   . Hypertension   . Substance abuse (HCC)    recovered - alcohol 2015, drugs 2012    Past Surgical History:  Procedure Laterality Date  . BACK SURGERY  2012  . FRACTURE SURGERY     right hand  . HAND SURGERY  1988  . SPINE SURGERY     , and lumbar decompression  . TONSILLECTOMY AND ADENOIDECTOMY      Family History  Problem Relation Age of Onset  . Arthritis Mother   . Cancer Father        barretts esophagus  . Depression Father   . Diabetes Father   . Hyperlipidemia Father   . Hypertension Father   . Stroke Maternal Grandmother   . Heart disease Maternal Grandfather 35  . Dementia Paternal Grandmother   . Diabetes Paternal Grandfather   . Depression Maternal  Aunt   . Alcohol abuse Maternal Aunt   . Alcohol abuse Maternal Uncle   . Drug abuse Paternal Uncle    Social History:  reports that he has quit smoking. He has never used smokeless tobacco. He reports that he drinks alcohol. He reports that he has current or past drug history. Drug: "Crack" cocaine.  Allergies: No Known Allergies  No medications prior to admission.    No results found for this or any previous visit (from the past 48 hour(s)).  No results found.   Pertinent items are noted in HPI.  Height 5\' 11"  (1.803 m), weight 131.1 kg (289 lb).  General appearance: alert, cooperative and appears stated age Head: Normocephalic, without obvious abnormality Neck: no JVD Resp: clear to auscultation bilaterally Cardio: regular rate and rhythm, S1, S2 normal, no murmur, click, rub or gallop GI: soft, non-tender; bowel sounds normal; no masses,  no organomegaly Extremities: deformity nail left thumb Pulses: 2+ and symmetric Skin: Skin color, texture, turgor normal. No rashes or lesions Neurologic: Grossly normal Incision/Wound: na  Assessment/Plan Assessment:  1. Nail deformity    Plan: I suspect that this is a mucoid cyst he does have mild degenerative changes at the IP joint. I have discussed excision of the mass under the nail matrix and  nail plate along with opening of the joint debridement with removal of proximal aspects. Pre-peri-postoperative course been discussed along with risks and comp occasions. He is aware there is no guarantee to surgery possibility of infection recurrence injury to arteries nerves tendons complete relief symptoms dystrophy continuation of nail deformity. Like to proceed and this is scheduled as an outpatient for excision nail bed lesion debridement IP joint thumb left side. To be done as an outpatient under regional anesthesia.      Scotti Motter R 08/22/2017, 9:06 AM

## 2017-08-22 NOTE — Op Note (Signed)
NAME: Tim [name] MEDICAL RECORD NO: [id number] DATE OF BIRTH: 08-20-1965 [facility] PHYSICIAN: Nicki ReaperGARY R. 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   ASSISTANT: none   ANESTHESIA:  Bier block with sedation and Local   INTRAVENOUS FLUIDS:  Per anesthesia flow sheet.   ESTIMATED BLOOD LOSS:  Minimal.   COMPLICATIONS:  None.   SPECIMENS:   Excision osteophytes mass left thumb   TOURNIQUET TIME:    Total Tourniquet Time Documented: Forearm (Left) - 28 minutes Total: Forearm (Left) - 28 minutes    DISPOSITION:  Stable to PACU.   INDICATIONS: Patient is a 52 year old male with history of gross deformity to the nail plate of his left thumb MRI reveals a cyst and beneath the nail matrix proximally of the thumb with a mucoid cyst proximally and degenerative arthritis he is desirous having this excised to try to form a more normal looking nail plate rather than a peak to nail plate with ingrowing on the ulnar aspect.  He is aware that there is no guarantee to the surgery the possibility of infection recurrence injury to arteries nerves tendons complete relief symptoms dystrophy nail deformity postoperatively.  Pre-peri-and postoperative course been discussed along with risks and complications.  He is seen in the preoperative area the extremity marked by both patient and surgeon antibiotic given  OPERATIVE COURSE: Patient is brought to the operating room where a forearm-based IV regional anesthetic was carried out without difficulty.  He was prepped using ChloraPrep in the supine position  with left arm free.  A three-minute dry time was allowed timeout taken to confirm patient procedure.  A metacarpal block was given with quarter percent bupivacaine without epinephrine at the initiation of the procedure.  After adequate anesthesia was afforded.  The nail plate was then removed without difficulty using a freer elevator.  A large mass was immediately apparent under the nail matrix proximally.  Was very spongy and feeling.  An incision was then made longitudinally and the nail matrix proximally.  Allowed visualization of a lesion.  This was bluntly dissected free and removed.  The appearance of a cystic nature.  Specimen was sent to pathology.  The wound was irrigated.  A separate incision was then made curvilinear over the interphalangeal joint along the lateral line.  This carried down through subcutaneous tissue.  Bleeders were electrocauterized as necessary with bipolar.  The right was opened and a hemostatic rondure was then used to do a synovectomy of the joint along with removal of osteophytes from the proximal phalanx.  Specimen was sent to pathology.  The wounds were then copiously irrigated with saline.  The skin was closed with interrupted 4-0 nylon sutures.  The nail matrix was closed with interrupted 6-0 chromic sutures.  Nonadherent gauze was placed into the proximal nail fold.  Nonadherent gauze was placed over the area of all incisions.  Patient had moderate discomfort secondary to the tourniquet three quarters of the way through the procedure a Penrose drain was used I was placed at the base of the thumb as a tourniquet in the forearm tourniquet deflated.  On deflation of the tourniquet all fingers immediately pinked after placing a sterile compressive dressing and splint to the thumb.  Patient was taken to the recovery room for observation in satisfactory condition.  He will be discharged home to return to [facility] in 1 week Celebrex.  He states he did not want any  narcotics.   Nicki Reaper, MD Electronically signed, 08/22/17

## 2017-08-22 NOTE — Transfer of Care (Signed)
Immediate Anesthesia Transfer of Care Note  Patient: Tim [name]  Procedure(s) Performed: LEFT THUMB EXCISION MASS NAILBED WITH DEBRIDEMENT OF INTERPHALANGEAL JOINT (Left Thumb)  Patient Location: PACU  Anesthesia Type:Bier block  Level of Consciousness: awake and patient cooperative  Airway & Oxygen Therapy: Patient Spontanous Breathing and Patient connected to face mask oxygen  Post-op Assessment: Report given to RN and Post -op Vital signs reviewed and stable  Post vital signs: Reviewed and stable  Last Vitals:  Vitals Value Taken Time  BP    Temp    Pulse    Resp    SpO2      Last Pain:  Vitals:   08/22/17 1011  TempSrc: Oral         Complications: No apparent anesthesia complications

## 2017-08-23 ENCOUNTER — Encounter (HOSPITAL_BASED_OUTPATIENT_CLINIC_OR_DEPARTMENT_OTHER): Payer: Self-pay | Admitting: Orthopedic Surgery

## 2017-09-16 DIAGNOSIS — F319 Bipolar disorder, unspecified: Secondary | ICD-10-CM | POA: Diagnosis not present

## 2017-09-16 DIAGNOSIS — I10 Essential (primary) hypertension: Secondary | ICD-10-CM | POA: Diagnosis not present

## 2017-09-16 DIAGNOSIS — Z6841 Body Mass Index (BMI) 40.0 and over, adult: Secondary | ICD-10-CM | POA: Diagnosis not present

## 2017-11-19 DIAGNOSIS — Z23 Encounter for immunization: Secondary | ICD-10-CM | POA: Diagnosis not present

## 2017-11-19 DIAGNOSIS — F319 Bipolar disorder, unspecified: Secondary | ICD-10-CM | POA: Diagnosis not present

## 2017-11-19 DIAGNOSIS — Z6841 Body Mass Index (BMI) 40.0 and over, adult: Secondary | ICD-10-CM | POA: Diagnosis not present

## 2017-12-31 DIAGNOSIS — F319 Bipolar disorder, unspecified: Secondary | ICD-10-CM | POA: Diagnosis not present

## 2017-12-31 DIAGNOSIS — M25511 Pain in right shoulder: Secondary | ICD-10-CM | POA: Diagnosis not present

## 2017-12-31 DIAGNOSIS — Z6841 Body Mass Index (BMI) 40.0 and over, adult: Secondary | ICD-10-CM | POA: Diagnosis not present

## 2018-01-09 DIAGNOSIS — M19011 Primary osteoarthritis, right shoulder: Secondary | ICD-10-CM | POA: Diagnosis not present

## 2018-01-09 DIAGNOSIS — M25511 Pain in right shoulder: Secondary | ICD-10-CM | POA: Diagnosis not present

## 2018-01-09 DIAGNOSIS — Z6841 Body Mass Index (BMI) 40.0 and over, adult: Secondary | ICD-10-CM | POA: Diagnosis not present

## 2018-01-09 DIAGNOSIS — G8929 Other chronic pain: Secondary | ICD-10-CM | POA: Diagnosis not present

## 2018-04-01 DIAGNOSIS — Z1211 Encounter for screening for malignant neoplasm of colon: Secondary | ICD-10-CM | POA: Diagnosis not present

## 2018-04-01 DIAGNOSIS — R200 Anesthesia of skin: Secondary | ICD-10-CM | POA: Diagnosis not present

## 2018-04-01 DIAGNOSIS — M19011 Primary osteoarthritis, right shoulder: Secondary | ICD-10-CM | POA: Diagnosis not present

## 2018-06-25 DIAGNOSIS — Z0000 Encounter for general adult medical examination without abnormal findings: Secondary | ICD-10-CM | POA: Diagnosis not present

## 2018-06-25 DIAGNOSIS — R7302 Impaired glucose tolerance (oral): Secondary | ICD-10-CM | POA: Diagnosis not present

## 2018-06-25 DIAGNOSIS — F319 Bipolar disorder, unspecified: Secondary | ICD-10-CM | POA: Diagnosis not present

## 2018-06-25 DIAGNOSIS — Z13220 Encounter for screening for lipoid disorders: Secondary | ICD-10-CM | POA: Diagnosis not present

## 2018-06-25 DIAGNOSIS — Z136 Encounter for screening for cardiovascular disorders: Secondary | ICD-10-CM | POA: Diagnosis not present

## 2018-06-27 DIAGNOSIS — M19011 Primary osteoarthritis, right shoulder: Secondary | ICD-10-CM | POA: Diagnosis not present

## 2018-06-27 DIAGNOSIS — M25511 Pain in right shoulder: Secondary | ICD-10-CM | POA: Diagnosis not present

## 2018-08-29 DIAGNOSIS — M4722 Other spondylosis with radiculopathy, cervical region: Secondary | ICD-10-CM | POA: Diagnosis not present

## 2018-08-29 DIAGNOSIS — Z6841 Body Mass Index (BMI) 40.0 and over, adult: Secondary | ICD-10-CM | POA: Diagnosis not present

## 2018-08-29 DIAGNOSIS — M19011 Primary osteoarthritis, right shoulder: Secondary | ICD-10-CM | POA: Diagnosis not present

## 2018-09-05 DIAGNOSIS — M542 Cervicalgia: Secondary | ICD-10-CM | POA: Diagnosis not present

## 2018-09-05 DIAGNOSIS — M47812 Spondylosis without myelopathy or radiculopathy, cervical region: Secondary | ICD-10-CM | POA: Diagnosis not present

## 2018-09-05 DIAGNOSIS — M4802 Spinal stenosis, cervical region: Secondary | ICD-10-CM | POA: Diagnosis not present

## 2018-09-10 DIAGNOSIS — M5412 Radiculopathy, cervical region: Secondary | ICD-10-CM | POA: Diagnosis not present

## 2018-09-10 DIAGNOSIS — Z6841 Body Mass Index (BMI) 40.0 and over, adult: Secondary | ICD-10-CM | POA: Diagnosis not present

## 2018-10-03 DIAGNOSIS — G5600 Carpal tunnel syndrome, unspecified upper limb: Secondary | ICD-10-CM | POA: Diagnosis not present

## 2018-10-29 ENCOUNTER — Other Ambulatory Visit: Payer: Self-pay

## 2018-10-29 DIAGNOSIS — Z20822 Contact with and (suspected) exposure to covid-19: Secondary | ICD-10-CM

## 2018-10-30 LAB — NOVEL CORONAVIRUS, NAA: SARS-CoV-2, NAA: NOT DETECTED

## 2018-11-04 DIAGNOSIS — M19011 Primary osteoarthritis, right shoulder: Secondary | ICD-10-CM | POA: Diagnosis not present

## 2018-12-02 DIAGNOSIS — M1611 Unilateral primary osteoarthritis, right hip: Secondary | ICD-10-CM | POA: Diagnosis not present

## 2018-12-02 DIAGNOSIS — Z6841 Body Mass Index (BMI) 40.0 and over, adult: Secondary | ICD-10-CM | POA: Diagnosis not present

## 2018-12-02 DIAGNOSIS — M25851 Other specified joint disorders, right hip: Secondary | ICD-10-CM | POA: Diagnosis not present

## 2018-12-04 DIAGNOSIS — G5603 Carpal tunnel syndrome, bilateral upper limbs: Secondary | ICD-10-CM | POA: Diagnosis not present

## 2018-12-12 DIAGNOSIS — M1611 Unilateral primary osteoarthritis, right hip: Secondary | ICD-10-CM | POA: Diagnosis not present

## 2018-12-12 DIAGNOSIS — M25551 Pain in right hip: Secondary | ICD-10-CM | POA: Diagnosis not present

## 2018-12-24 DIAGNOSIS — M25851 Other specified joint disorders, right hip: Secondary | ICD-10-CM | POA: Diagnosis not present

## 2018-12-24 DIAGNOSIS — M1611 Unilateral primary osteoarthritis, right hip: Secondary | ICD-10-CM | POA: Diagnosis not present

## 2018-12-24 DIAGNOSIS — Z6841 Body Mass Index (BMI) 40.0 and over, adult: Secondary | ICD-10-CM | POA: Diagnosis not present

## 2019-01-01 DIAGNOSIS — Z6841 Body Mass Index (BMI) 40.0 and over, adult: Secondary | ICD-10-CM | POA: Diagnosis not present

## 2019-01-01 DIAGNOSIS — G5603 Carpal tunnel syndrome, bilateral upper limbs: Secondary | ICD-10-CM | POA: Diagnosis not present

## 2019-01-01 DIAGNOSIS — I10 Essential (primary) hypertension: Secondary | ICD-10-CM | POA: Diagnosis not present

## 2019-01-12 DIAGNOSIS — G5602 Carpal tunnel syndrome, left upper limb: Secondary | ICD-10-CM | POA: Diagnosis not present

## 2019-01-27 ENCOUNTER — Telehealth: Payer: Self-pay | Admitting: Orthopedic Surgery

## 2019-01-27 NOTE — Telephone Encounter (Signed)
Patient brought in Xray CD and report (request has been signed for provider notes) from [facility],[place] office, Oliver Hum FNP. Patient requests Dr Romeo Apple to evaluate for right hip problem/second opinion. Asking for response by Friday, 01/30/19. Patient aware, pending review.  Placed in Dr's box. Patient ph [phone]

## 2019-01-29 ENCOUNTER — Encounter: Payer: Self-pay | Admitting: Orthopedic Surgery

## 2019-02-03 ENCOUNTER — Other Ambulatory Visit: Payer: Self-pay

## 2019-02-03 ENCOUNTER — Encounter: Payer: Self-pay | Admitting: Orthopedic Surgery

## 2019-02-03 ENCOUNTER — Ambulatory Visit: Payer: 59 | Admitting: Orthopedic Surgery

## 2019-02-03 VITALS — BP 172/85 | HR 120 | Ht 71.0 in | Wt 299.0 lb

## 2019-02-03 DIAGNOSIS — M1611 Unilateral primary osteoarthritis, right hip: Secondary | ICD-10-CM

## 2019-02-03 NOTE — Patient Instructions (Signed)
Total Hip Replacement  Total hip replacement is a surgery to remove damaged bone in your hip joint and replace it with an artificial (prosthetic) hip joint. The hip is a ball-and-socket type of joint. It has two main parts. The ball part of the joint (femoral head) is the top of the thighbone (femur). The socket part of the joint is a large, hollow area on the outer side of your pelvis (acetabulum) where the femur and pelvis meet. During total hip replacement, one or both parts of the hip joint are replaced, depending on the type of joint damage that you have. The purpose of this surgery is to reduce pain and improve your hip function. Tell a health care provider about:  Any allergies you have.  All medicines you are taking, including vitamins, herbs, eye drops, creams, and over-the-counter medicines.  Any problems you or family members have had with anesthetic medicines.  Any blood disorders you have.  Any surgeries you have had.  Any medical conditions you have.  Whether you are pregnant or may be pregnant. What are the risks? Generally, this is a safe procedure. However, problems may occur, including:  Infection.  Bleeding.  Allergic reactions to medicines.  Damage to nerves or other structures.  Dislocation of the prosthetic joint (prosthesis).  Loosening of the prosthesis.  Fracture of the bone.  A blood clot, which can break loose and travel to your lungs (pulmonary embolus).  Compartment syndrome.  Deep vein thrombosis. What happens before the procedure? Staying hydrated Follow instructions from your health care provider about hydration, which may include:  Up to 2 hours before the procedure - you may continue to drink clear liquids, such as water, clear fruit juice, black coffee, and plain tea. Eating and drinking restrictions  Follow instructions from your health care provider about eating and drinking, which may include: ? 8 hours before the procedure - stop  eating heavy meals or foods such as meat, fried foods, or fatty foods. ? 6 hours before the procedure - stop eating light meals or foods, such as toast or cereal. ? 6 hours before the procedure - stop drinking milk or drinks that contain milk. ? 2 hours before the procedure - stop drinking clear liquids. Medicines  Ask your health care provider about: ? Changing or stopping your regular medicines. This is especially important if you are taking diabetes medicines or blood thinners. ? Taking medicines such as aspirin and ibuprofen. These medicines can thin your blood. Do not take these medicines unless your health care provider tells you to take them. ? Taking over-the-counter medicines, vitamins, herbs, or supplements. General instructions  You may have a physical exam.  You may have testing, such as: ? X-rays or an MRI. ? Blood or urine tests.  Plan to have someone take you home from the hospital or clinic.  Plan to have a responsible adult care for you for at least 24 hours after you leave the hospital or clinic. This is important.  Prepare your home so you can be safe and have easy access to what you need.  Keep your body and teeth clean. Germs from anywhere in your body can travel to your new joint and infect it. Tell your health care provider if you: ? Plan to have dental care and routine cleanings. ? Develop any skin infections.  Avoid shaving your legs just before surgery. If any shaving is needed, it will be done in the hospital.  Ask your health care provider how   your surgical site will be marked or identified. What happens during the procedure?  To lower your risk of infection: ? Your health care team will wash or sanitize their hands. ? Hair may be removed from the surgical area. ? Your skin will be washed with soap.  An IV will be inserted into one of your veins.  You will be given one or more of the following: ? A medicine to help you relax (sedative). ? A  medicine to make you fall asleep (general anesthetic). ? A medicine that is injected into your spine to numb the area below and slightly above the injection site (spinal anesthetic).  An incision will be made so the surgeon can see the bones and tissue in your hip. The location of the incision will depend on the approach used by the surgeon: ? Posterior approach. The incision will be at the back of the hip. ? Anterior approach. The incision will be at the front of the hip.  Then, your surgeon will: ? Use his or her hands to move your hip out of position (dislocate it). ? Cut and remove damaged pieces of bone and cartilage. ? Insert a prosthetic ball and socket into the hip joint. ? Secure the ball and socket in the hip joint. ? Do an X-ray of the hip joint to confirm proper placement. ? Place a drain to remove excess fluid, if needed. ? Close the incision and apply a bandage (dressing) over the surgical site. The procedure may vary among health care providers and hospitals. What happens after the procedure?  Your blood pressure, heart rate, breathing rate, and blood oxygen level will be monitored until the medicines you were given have worn off.  Your neurovascular status will be monitored.  You will be given pain medicine.  You may have to wear compression stockings. These help to prevent blood clots and reduce swelling in your legs. You may also be given a blood-thinning (anticoagulant) medicine.  You will receive physical therapy until your health care provider feels it is safe for you to go home. You may need to use a walker or crutches.  If you had the posterior approach, you may have to use a wedge (hip abduction) pillow when you are in bed. ? This pillow will protect your hip from dislocation by keeping it straight and will prevent your legs from turning inward or away from your body. Summary  Total hip replacement is a surgery to remove damaged bone in your hip joint and  replace it with an artificial (prosthetic) hip joint.  Before the procedure, follow instructions from your health care provider about eating and drinking.  Plan to have someone take you home from the hospital or clinic.  After the surgery, you may have to wear compression stockings. These help to prevent blood clots and reduce swelling in your legs. This information is not intended to replace advice given to you by your health care provider. Make sure you discuss any questions you have with your health care provider. Document Revised: 05/19/2018 Document Reviewed: 10/03/2016 Elsevier Patient Education  2020 [employer]  

## 2019-02-03 NOTE — Telephone Encounter (Signed)
Patient aware. Appointment for 02/03/19 with Dr Romeo Apple; patient confirmed.

## 2019-02-03 NOTE — Addendum Note (Signed)
Addended byCaffie Damme on: 02/03/2019 02:59 PM   Modules accepted: Orders

## 2019-02-03 NOTE — Progress Notes (Signed)
Tim [name]  02/03/2019  Body mass index is 41.7 kg/m.   HISTORY SECTION :  Chief Complaint  Patient presents with  . Hip Pain    second opinion right hip pain    Tim [name] is a 54 year old male who is requested second opinion regarding his right hip  He is 54 years old he started having hip pain approximately November 2020.  He said he had had some mild pain prior to that but 1 day he had acute onset of pain in the right hip after changing a pair of shoes.  He was treated with anti-inflammatories and he eventually had an intra-articular injection which relieved his pain by 100% but unfortunately it came back.  He was previously seen at [facility] and sports medicine and even and their recommendation was that he see a hip specialist for replacement.  He did not follow-up at [facility] because of an insurance change.  Currently complains of severe pain in the right hip and groin with difficulty walking  Patient works as a Engineer, manufacturing systems    Review of Systems  Constitutional: Negative for chills and fever.  Musculoskeletal: Positive for joint pain.  Neurological: Negative for tingling.     has a past medical history of Anxiety, Bipolar disorder ([place]), Depression, Hyperlipidemia, Hypertension, and Substance abuse ([place]).   Past Surgical History:  Procedure Laterality Date  . BACK SURGERY  2012  . EXCISION MASS UPPER EXTREMETIES Left 08/22/2017   Procedure: LEFT THUMB EXCISION MASS NAILBED WITH DEBRIDEMENT OF INTERPHALANGEAL JOINT;  Surgeon: Daryll Brod, MD;  Location: [place];  Service: Orthopedics;  Laterality: Left;  . FRACTURE SURGERY     right hand  . HAND SURGERY  1988  . SPINE SURGERY     , and lumbar decompression  . TONSILLECTOMY AND ADENOIDECTOMY      Body mass index is 41.7 kg/m.   No Known Allergies   Current Outpatient Medications:  .  acetaminophen (TYLENOL) 500 MG tablet, Take 500 mg by mouth every 6 (six) hours as needed., Disp: ,  Rfl:  .  atorvastatin (LIPITOR) 40 MG tablet, Take 1 tablet (40 mg total) by mouth daily., Disp: 90 tablet, Rfl: 3 .  cyclobenzaprine (FLEXERIL) 10 MG tablet, Take 1 tablet (10 mg total) by mouth 3 (three) times daily as needed for muscle spasms., Disp: 90 tablet, Rfl: 0 .  diclofenac (VOLTAREN) 75 MG EC tablet, Take 1 tablet by mouth twice daily as needed, Disp: , Rfl:  .  lisinopril-hydrochlorothiazide (PRINZIDE,ZESTORETIC) 10-12.5 MG tablet, Take 1 tablet by mouth daily., Disp: 90 tablet, Rfl: 3 .  Melatonin 3 MG TABS, Take by mouth., Disp: , Rfl:  .  QUEtiapine (SEROQUEL) 100 MG tablet, Take 100 mg by mouth at bedtime., Disp: , Rfl:  .  ARIPiprazole (ABILIFY) 15 MG tablet, Take 15 mg by mouth at bedtime., Disp: , Rfl:  .  oxyCODONE-acetaminophen (PERCOCET/ROXICET) 5-325 MG tablet, TAKE 1 TABLET BY MOUTH EVERY 4 HOURS AS NEEDED FOR PAIN, Disp: , Rfl:    PHYSICAL EXAM SECTION: 1) BP (!) 172/85   Pulse (!) 120   Ht 5\' 11"  (1.803 m)   Wt 299 lb (135.6 kg)   BMI 41.70 kg/m   Body mass index is 41.7 kg/m. General appearance: Well-developed well-nourished no gross deformities  2) Cardiovascular normal pulse and perfusion in the lower extremities normal color without edema  3) Neurologically deep tendon reflexes are equal and normal, no sensation loss or deficits no pathologic reflexes  4) Psychological: Awake alert and oriented x3 mood and affect normal  5) Skin no lacerations or ulcerations no nodularity no palpable masses, no erythema or nodularity  6) Musculoskeletal:   Right hip pain at 90 degrees flexion and pain with internal rotation  Both legs are equal in length lying in external rotation he has 120 degrees of flexion in the left hip with no pain    MEDICAL DECISION SECTION:  Encounter Diagnosis  Name Primary?  Marland Kitchen Arthritis of right hip Yes  Possible acute subchondral fracture at initial onset of severe pain  Imaging I have seen his MRI my interpretation of the MRI is  that he has a hotspot in the right hip there appears to be some subchondral bone irregularity that may represent fracture.  There is surrounding arthritis in the hip as well.  This is also cartilage loss in the left hip although the right hip is much worse  Patient brought in his office notes from [facility] in [place] dated 12/28 and 1222 as well as 12 / 2.  Plan:  (Rx., Inj., surg., Frx, MRI/CT, XR:2)  Patient will be referred for right total hip  Made aware may have to do posteriorly or laterally versus anteriorly due to the abdominal protuberance       2:27 PM Fuller Canada, MD  02/03/2019

## 2019-02-05 ENCOUNTER — Ambulatory Visit (INDEPENDENT_AMBULATORY_CARE_PROVIDER_SITE_OTHER): Payer: 59 | Admitting: Physician Assistant

## 2019-02-05 ENCOUNTER — Encounter: Payer: Self-pay | Admitting: Physician Assistant

## 2019-02-05 ENCOUNTER — Other Ambulatory Visit: Payer: Self-pay

## 2019-02-05 ENCOUNTER — Ambulatory Visit (INDEPENDENT_AMBULATORY_CARE_PROVIDER_SITE_OTHER): Payer: 59

## 2019-02-05 VITALS — Ht 70.67 in | Wt 295.4 lb

## 2019-02-05 DIAGNOSIS — M25551 Pain in right hip: Secondary | ICD-10-CM

## 2019-02-05 NOTE — Progress Notes (Addendum)
Office Visit Note   Patient: Tim [name]           Date of Birth: 09/20/1965           MRN: [id number] Visit Date: 02/05/2019              Requested by: Carole Civil, [place],  [place] [address] PCP: System, Pcp Not In   Assessment & Plan: Visit Diagnoses:  1. Pain in right hip     Plan:  Discussed with patient need for weight loss prior to any surgery and also the need to wait approximately 5 to 6 months for surgical intervention due to the fact that he had a intra-articular injection in his does increase risk of infection.  We will see him back in 1 month for weight check.  He will continue to use his rolling walker to offload the right hip.  Explained to him that there is definite uncertainty with total joint replacements at this point time due to the Covid pandemic.  Questions were encouraged and answered by Dr. Ninfa Linden myself about his right hip arthritis and also right total hip arthroplasty.  Follow-Up Instructions: Return in about 4 weeks (around 03/05/2019) for Weight check.   Orders:  Orders Placed This Encounter  Procedures  . XR HIP UNILAT W OR W/O PELVIS 2-3 VIEWS RIGHT   No orders of the defined types were placed in this encounter.     Procedures: No procedures performed   Clinical Data: No additional findings.   Subjective: Chief Complaint  Patient presents with  . Right Hip - Pain    HPI  Tim [name] is 54 year old male comes in today with right hip pain.  He is referred by Dr. Anselm Jungling.  He notes his pain became as severe in November 2020.  He felt it was due to changing to a different pair shoes.  For that he had some minor discomfort in the hip now his pain is became quite debilitating to the point that he is using a rolling walker.  No known injury to the hip.  He reports a intra-articular injection on November 20 right hip which gave him relief for only 30 to 45 minutes and his pain came back.  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  He brings with him an MRI of his right hip is in the radiologist read.  This showed full-thickness cartilage loss in the right hip and mild thinning of the right hip joint.  Small right hip joint effusion.  Review of Systems  Constitutional: Positive for activity change. Negative for chills and fever.  Respiratory: Negative for shortness of breath.   Cardiovascular: Negative for chest pain.  Musculoskeletal: Positive for arthralgias and gait problem.     Objective: Vital Signs: Ht 5' 10.67" (1.795 m)   Wt 295 lb 6.4 oz (134 kg)   BMI 41.59 kg/m   Physical Exam Constitutional:      Appearance: He is obese. He is not ill-appearing, toxic-appearing or diaphoretic.  Cardiovascular:     Pulses: Normal pulses.  Pulmonary:     Effort: Pulmonary effort is normal.  Neurological:     Mental Status: He is oriented to person, place, and time.  Psychiatric:        Mood and Affect: Mood normal.     Ortho Exam Ambulates with an antalgic gait with the use of a  rolling walker.  Limited range of motion of the right hip with extreme pain with internal rotation.  Good range of motion of the left hip without pain.  Calf supple nontender bilaterally. Specialty Comments:  No specialty comments available.  Imaging: XR HIP UNILAT W OR W/O PELVIS 2-3 VIEWS RIGHT  Result Date: 02/05/2019 AP pelvis lateral view of the right hip: Shows moderately severe arthritic changes right hip and some mild arthritic changes of the left hip.  Both hips well located.  No acute fractures.  No bony abnormalities otherwise.    PMFS History: Patient Active Problem List   Diagnosis Date Noted  . Morbid obesity with BMI of 40.0-44.9, adult (HCC) 07/14/2017  . Mood disorder in conditions classified elsewhere 02/14/2017  . Alcohol use disorder, severe, in sustained remission (HCC) 02/14/2017  . Cocaine use disorder,  severe, in sustained remission (HCC) 02/14/2017  . Chronic back pain greater than 3 months duration 01/24/2017  . Migraine headache without aura 01/24/2017  . Paronychia of left thumb 06/11/2016  . History of prior cigarette smoking 05/14/2016  . Substance abuse in remission (HCC) 05/14/2016  . Alcohol abuse, in remission 05/14/2016  . Pincer nail deformity 05/14/2016  . Class 2 obesity with body mass index (BMI) of 39.0 to 39.9 in adult 05/14/2016  . Hypertriglyceridemia 04/25/2010  . Metabolic syndrome 04/25/2010  . HTN (hypertension) 04/25/2010  . Bipolar 1 disorder (HCC) 04/25/2010   Past Medical History:  Diagnosis Date  . Anxiety   . Bipolar disorder (HCC)   . Depression   . Hyperlipidemia   . Hypertension   . Substance abuse (HCC)    recovered - alcohol 2015, drugs 2012    Family History  Problem Relation Age of Onset  . Arthritis Mother   . Cancer Father        barretts esophagus  . Depression Father   . Diabetes Father   . Hyperlipidemia Father   . Hypertension Father   . Stroke Maternal Grandmother   . Heart disease Maternal Grandfather 69  . Dementia Paternal Grandmother   . Diabetes Paternal Grandfather   . Depression Maternal Aunt   . Alcohol abuse Maternal Aunt   . Alcohol abuse Maternal Uncle   . Drug abuse Paternal Uncle     Past Surgical History:  Procedure Laterality Date  . BACK SURGERY  2012  . EXCISION MASS UPPER EXTREMETIES Left 08/22/2017   Procedure: LEFT THUMB EXCISION MASS NAILBED WITH DEBRIDEMENT OF INTERPHALANGEAL JOINT;  Surgeon: Cindee Salt, MD;  Location: [facility];  Service: Orthopedics;  Laterality: Left;  . FRACTURE SURGERY     right hand  . HAND SURGERY  1988  . SPINE SURGERY     , and lumbar decompression  . TONSILLECTOMY AND ADENOIDECTOMY     Social History   Occupational History  . Occupation: sheet metal fabrication  Tobacco Use  . Smoking status: Former Games developer  . Smokeless tobacco: Never Used  . Tobacco  comment: quit smoking 6 years ago  Substance and Sexual Activity  . Alcohol use: Yes    Comment: occasionally/ been through rehab  . Drug use: Yes    Types: "Crack" cocaine    Comment: relapse w/ crack 11/2010  . Sexual activity: Not Currently    Birth control/protection: None

## 2019-03-05 ENCOUNTER — Ambulatory Visit (INDEPENDENT_AMBULATORY_CARE_PROVIDER_SITE_OTHER): Payer: 59 | Admitting: Physician Assistant

## 2019-03-05 ENCOUNTER — Other Ambulatory Visit: Payer: Self-pay

## 2019-03-05 ENCOUNTER — Encounter: Payer: Self-pay | Admitting: Physician Assistant

## 2019-03-05 VITALS — Ht 70.67 in | Wt 285.0 lb

## 2019-03-05 DIAGNOSIS — M1611 Unilateral primary osteoarthritis, right hip: Secondary | ICD-10-CM

## 2019-03-05 NOTE — Progress Notes (Signed)
HPI: Tim [name] returns today follow-up mainly for weight check.  He is lost approximately 14 pounds since last visit.  He is down to 285 pounds with BMI 40.12.  States his pain is getting worse.  He is unable to sleep due to the right hip pain.  He is on the keto diet this is how he is losing weight.  Impression: Right hip osteoarthritis  Plan: He will continue to work on weight loss.  We will see him back in a month for another weight check.  Call our office if he is able lose another 10 pounds prior to then.  Questions encouraged and answered.  No charge for today's office visit.

## 2019-03-19 ENCOUNTER — Encounter: Payer: Self-pay | Admitting: Physician Assistant

## 2019-03-19 ENCOUNTER — Ambulatory Visit (INDEPENDENT_AMBULATORY_CARE_PROVIDER_SITE_OTHER): Payer: 59 | Admitting: Physician Assistant

## 2019-03-19 ENCOUNTER — Other Ambulatory Visit: Payer: Self-pay

## 2019-03-19 VITALS — Ht 70.67 in | Wt 273.4 lb

## 2019-03-19 DIAGNOSIS — M1611 Unilateral primary osteoarthritis, right hip: Secondary | ICD-10-CM

## 2019-03-19 NOTE — Progress Notes (Signed)
Office Visit Note   Patient: Tim [name]           Date of Birth: Oct 22, 1965           MRN: [id number] Visit Date: 03/19/2019              Requested by: No referring provider defined for this encounter. PCP: System, Pcp Not In   Assessment & Plan: Visit Diagnoses:  1. Primary osteoarthritis of right hip     Plan: Due to patient's continued pain in the right hip despite conservative treatment which is included intra-articular injections, weight loss, and assistive devices recommend right total hip arthroplasty.  Questions were encouraged and answered at length today.  Risk benefits reviewed with patient again this includes but not limited to prolonged pain, worsening pain, wound infection, DVT/PE nerve and vessel injury.  Schedule patient for right total hip arthroplasty in the near future.   Follow-Up Instructions: Return 2 weeks postop.   Orders:  No orders of the defined types were placed in this encounter.  No orders of the defined types were placed in this encounter.     Procedures: No procedures performed   Clinical Data: No additional findings.   Subjective: Chief Complaint  Patient presents with  . Right Hip - Follow-up    HPI Tim [name] returns today for weight check.  He is also in wanting to schedule surgery soon as possible on his right hip.  He has lost down to 273 pounds on the keto diet.  He is ambulating with a walker.  He is having severe pain in the hip that keeping him from doing his activities of daily living including only being able to work for 3 hours a day.  He has had no new injury to the right hip.  Review of Systems Negative for fevers chills shortness of breath chest pain  Objective: Vital Signs: Ht 5' 10.67" (1.795 m)   Wt 273 lb 6.4 oz (124 kg)   BMI 38.49 kg/m   Physical Exam Constitutional:      Appearance: He is not ill-appearing or diaphoretic.  Pulmonary:     Effort: Pulmonary effort is normal.  Neurological:   Mental Status: He is alert and oriented to person, place, and time.  Psychiatric:        Mood and Affect: Mood normal.     Ortho Exam Right hip good external rotation without pain.  Internal rotation causes severe pain is severe limited.  Right calf supple nontender. Specialty Comments:  No specialty comments available.  Imaging: No results found.   PMFS History: Patient Active Problem List   Diagnosis Date Noted  . Morbid obesity with BMI of 40.0-44.9, adult (HCC) 07/14/2017  . Mood disorder in conditions classified elsewhere 02/14/2017  . Alcohol use disorder, severe, in sustained remission (HCC) 02/14/2017  . Cocaine use disorder, severe, in sustained remission (HCC) 02/14/2017  . Chronic back pain greater than 3 months duration 01/24/2017  . Migraine headache without aura 01/24/2017  . Paronychia of left thumb 06/11/2016  . History of prior cigarette smoking 05/14/2016  . Substance abuse in remission (HCC) 05/14/2016  . Alcohol abuse, in remission 05/14/2016  . Pincer nail deformity 05/14/2016  . Class 2 obesity with body mass index (BMI) of 39.0 to 39.9 in adult 05/14/2016  . Hypertriglyceridemia 04/25/2010  . Metabolic syndrome 04/25/2010  . HTN (hypertension) 04/25/2010  . Bipolar 1 disorder (HCC) 04/25/2010   Past Medical History:  Diagnosis Date  . Anxiety   .  Bipolar disorder ([place])   . Depression   . Hyperlipidemia   . Hypertension   . Substance abuse ([place])    recovered - alcohol 2015, drugs 2012    Family History  Problem Relation Age of Onset  . Arthritis Mother   . Cancer Father        barretts esophagus  . Depression Father   . Diabetes Father   . Hyperlipidemia Father   . Hypertension Father   . Stroke Maternal Grandmother   . Heart disease Maternal Grandfather 42  . Dementia Paternal Grandmother   . Diabetes Paternal Grandfather   . Depression Maternal Aunt   . Alcohol abuse Maternal Aunt   . Alcohol abuse Maternal Uncle   . Drug abuse  Paternal Uncle     Past Surgical History:  Procedure Laterality Date  . BACK SURGERY  2012  . EXCISION MASS UPPER EXTREMETIES Left 08/22/2017   Procedure: LEFT THUMB EXCISION MASS NAILBED WITH DEBRIDEMENT OF INTERPHALANGEAL JOINT;  Surgeon: Daryll Brod, MD;  Location: [place];  Service: Orthopedics;  Laterality: Left;  . FRACTURE SURGERY     right hand  . HAND SURGERY  1988  . SPINE SURGERY     , and lumbar decompression  . TONSILLECTOMY AND ADENOIDECTOMY     Social History   Occupational History  . Occupation: sheet metal fabrication  Tobacco Use  . Smoking status: Former Research scientist (life sciences)  . Smokeless tobacco: Never Used  . Tobacco comment: quit smoking 6 years ago  Substance and Sexual Activity  . Alcohol use: Yes    Comment: occasionally/ been through rehab  . Drug use: Yes    Types: "Crack" cocaine    Comment: relapse w/ crack 11/2010  . Sexual activity: Not Currently    Birth control/protection: None

## 2019-03-25 ENCOUNTER — Other Ambulatory Visit: Payer: Self-pay | Admitting: Physician Assistant

## 2019-03-25 NOTE — Patient Instructions (Addendum)
DUE TO COVID-19 ONLY ONE VISITOR IS ALLOWED TO COME WITH YOU AND STAY IN THE WAITING ROOM ONLY DURING PRE OP AND PROCEDURE DAY OF SURGERY. THE 1 VISITOR MAY VISIT WITH YOU AFTER SURGERY IN YOUR PRIVATE ROOM DURING VISITING HOURS ONLY!  YOU NEED TO HAVE A COVID 19 TEST ON: 03/31/19 @  1:00 pm, at [place], ONCE YOUR COVID TEST IS COMPLETED, PLEASE BEGIN THE QUARANTINE INSTRUCTIONS AS OUTLINED IN YOUR HANDOUT.                Tim [name]     Your procedure is scheduled on: 04/03/19   Report to [facility] Main  Entrance   Report to [place] at: 5:30AM     Call this number if you have problems the morning of surgery [phone]    Remember:   NO SOLID FOOD AFTER MIDNIGHT THE NIGHT PRIOR TO SURGERY. NOTHING BY MOUTH EXCEPT CLEAR LIQUIDS UNTIL: 4:00 am . PLEASE FINISH ENSURE DRINK PER SURGEON ORDER  WHICH NEEDS TO BE COMPLETED AT : 4:00 am.   CLEAR LIQUID DIET   Foods Allowed                                                                     Foods Excluded  Coffee and tea, regular and decaf                             liquids that you cannot  Plain Jell-O any favor except red or purple                                           see through such as: Fruit ices (not with fruit pulp)                                     milk, soups, orange juice  Iced Popsicles                                    All solid food Carbonated beverages, regular and diet                                    Cranberry, grape and apple juices Sports drinks like Gatorade Lightly seasoned clear broth or consume(fat free) Sugar, honey syrup  Sample Menu Breakfast                                Lunch                                     Supper Cranberry juice  Beef broth                            Chicken broth Jell-O                                     Grape juice                           Apple juice Coffee or tea                         Jell-O                                      Popsicle                                                Coffee or tea                        Coffee or tea  _____________________________________________________________________       BRUSH YOUR TEETH MORNING OF SURGERY AND RINSE YOUR MOUTH OUT, NO CHEWING GUM CANDY OR MINTS.     Take these medicines the morning of surgery with A SIP OF WATER: Quetiapine(Seroquel)                                 You may not have any metal on your body including hair pins and              piercings  Do not wear jewelry,lotions, powders or perfumes, deodorant             Men may shave face and neck.   Do not bring valuables to the hospital. [facility] IS NOT             RESPONSIBLE   FOR VALUABLES.  Contacts, dentures or bridgework may not be worn into surgery.  Leave suitcase in the car. After surgery it may be brought to your room.     Patients discharged the day of surgery will not be allowed to drive home. IF YOU ARE HAVING SURGERY AND GOING HOME THE SAME DAY, YOU MUST HAVE AN ADULT TO DRIVE YOU HOME AND BE WITH YOU FOR 24 HOURS. YOU MAY GO HOME BY TAXI OR UBER OR ORTHERWISE, BUT AN ADULT MUST ACCOMPANY YOU HOME AND STAY WITH YOU FOR 24 HOURS.  Name and phone number of your driver:  Special Instructions: N/A              Please read over the following fact sheets you were given: _____________________________________________________________________  [facility] - Preparing for Surgery Before surgery, you can play an important role.  Because skin is not sterile, your skin needs to be as free of germs as possible.  You can reduce the number of germs on your skin by washing with CHG (chlorahexidine gluconate) soap before surgery.  CHG is an antiseptic cleaner which kills germs and bonds with the skin to continue killing germs even after  washing. Please DO NOT use if you have an allergy to CHG or antibacterial soaps.  If your skin becomes  reddened/irritated stop using the CHG and inform your nurse when you arrive at Short Stay. Do not shave (including legs and underarms) for at least 48 hours prior to the first CHG shower.  You may shave your face/neck. Please follow these instructions carefully:  1.  Shower with CHG Soap the night before surgery and the  morning of Surgery.  2.  If you choose to wash your hair, wash your hair first as usual with your  normal  shampoo.  3.  After you shampoo, rinse your hair and body thoroughly to remove the  shampoo.                           4.  Use CHG as you would any other liquid soap.  You can apply chg directly  to the skin and wash                       Gently with a scrungie or clean washcloth.  5.  Apply the CHG Soap to your body ONLY FROM THE NECK DOWN.   Do not use on face/ open                           Wound or open sores. Avoid contact with eyes, ears mouth and genitals (private parts).                       Wash face,  Genitals (private parts) with your normal soap.             6.  Wash thoroughly, paying special attention to the area where your surgery  will be performed.  7.  Thoroughly rinse your body with warm water from the neck down.  8.  DO NOT shower/wash with your normal soap after using and rinsing off  the CHG Soap.                9.  Pat yourself dry with a clean towel.            10.  Wear clean pajamas.            11.  Place clean sheets on your bed the night of your first shower and do not  sleep with pets. Day of Surgery : Do not apply any lotions/deodorants the morning of surgery.  Please wear clean clothes to the hospital/surgery center.  FAILURE TO FOLLOW THESE INSTRUCTIONS MAY RESULT IN THE CANCELLATION OF YOUR SURGERY PATIENT SIGNATURE_________________________________  NURSE SIGNATURE__________________________________  ________________________________________________________________________

## 2019-03-25 NOTE — Progress Notes (Signed)
Can you please place some orders for the upcoming surgery.Pt.'s PST appointment is on 03/27/19.Thank you.

## 2019-03-26 ENCOUNTER — Other Ambulatory Visit: Payer: Self-pay

## 2019-03-27 ENCOUNTER — Encounter (HOSPITAL_COMMUNITY): Payer: Self-pay

## 2019-03-27 ENCOUNTER — Encounter (HOSPITAL_COMMUNITY)
Admission: RE | Admit: 2019-03-27 | Discharge: 2019-03-27 | Disposition: A | Discharge status: Home / Self Care | Payer: 59 | Source: Ambulatory Visit | Attending: Orthopaedic Surgery | Admitting: Orthopaedic Surgery

## 2019-03-27 ENCOUNTER — Other Ambulatory Visit: Payer: Self-pay

## 2019-03-27 DIAGNOSIS — Z01818 Encounter for other preprocedural examination: Secondary | ICD-10-CM | POA: Insufficient documentation

## 2019-03-27 HISTORY — DX: Unspecified osteoarthritis, unspecified site: M19.90

## 2019-03-27 LAB — CBC
HCT: 43.1 % (ref 39.0–52.0)
Hemoglobin: 14.2 g/dL (ref 13.0–17.0)
MCH: 29.6 pg (ref 26.0–34.0)
MCHC: 32.9 g/dL (ref 30.0–36.0)
MCV: 90 fL (ref 80.0–100.0)
Platelets: 277 10*3/uL (ref 150–400)
RBC: 4.79 MIL/uL (ref 4.22–5.81)
RDW: 11.2 % — ABNORMAL LOW (ref 11.5–15.5)
WBC: 9.8 10*3/uL (ref 4.0–10.5)
nRBC: 0 % (ref 0.0–0.2)

## 2019-03-27 LAB — SURGICAL PCR SCREEN
MRSA, PCR: NEGATIVE
Staphylococcus aureus: NEGATIVE

## 2019-03-27 LAB — BASIC METABOLIC PANEL
Anion gap: 7 (ref 5–15)
BUN: 28 mg/dL — ABNORMAL HIGH (ref 6–20)
CO2: 26 mmol/L (ref 22–32)
Calcium: 9.2 mg/dL (ref 8.9–10.3)
Chloride: 104 mmol/L (ref 98–111)
Creatinine, Ser: 1.29 mg/dL — ABNORMAL HIGH (ref 0.61–1.24)
GFR calc Af Amer: >60 mL/min (ref >60)
GFR calc non Af Amer: >60 mL/min (ref >60)
Glucose, Bld: 98 mg/dL (ref 70–99)
Potassium: 4.1 mmol/L (ref 3.5–5.1)
Sodium: 137 mmol/L (ref 135–145)

## 2019-03-27 NOTE — Progress Notes (Signed)
PCP -  [address] PAC. LOV: 02/05/19 Cardiologist -   Chest x-ray -  EKG -  Stress Test -  ECHO -  Cardiac Cath -   Sleep Study -  CPAP -   Fasting Blood Sugar -  Checks Blood Sugar _____ times a day  Blood Thinner Instructions: Aspirin Instructions: Last Dose:  Anesthesia review:   Patient denies shortness of breath, fever, cough and chest pain at PAT appointment   Patient verbalized understanding of instructions that were given to them at the PAT appointment. Patient was also instructed that they will need to review over the PAT instructions again at home before surgery.

## 2019-03-31 ENCOUNTER — Other Ambulatory Visit (HOSPITAL_COMMUNITY)
Admission: RE | Admit: 2019-03-31 | Discharge: 2019-03-31 | Disposition: A | Discharge status: Home / Self Care | Payer: 59 | Source: Ambulatory Visit | Attending: Orthopaedic Surgery | Admitting: Orthopaedic Surgery

## 2019-03-31 ENCOUNTER — Other Ambulatory Visit: Payer: Self-pay

## 2019-03-31 DIAGNOSIS — Z20822 Contact with and (suspected) exposure to covid-19: Secondary | ICD-10-CM | POA: Insufficient documentation

## 2019-03-31 DIAGNOSIS — Z01812 Encounter for preprocedural laboratory examination: Secondary | ICD-10-CM | POA: Insufficient documentation

## 2019-03-31 LAB — SARS CORONAVIRUS 2 (TAT 6-24 HRS): SARS Coronavirus 2: NEGATIVE

## 2019-04-02 ENCOUNTER — Ambulatory Visit: Payer: 59 | Admitting: Physician Assistant

## 2019-04-02 MED ORDER — DEXTROSE 5 % IV SOLN
3.0000 g | INTRAVENOUS | Status: AC
  Administered 2019-04-03: 3 g via INTRAVENOUS
  Filled 2019-04-02: qty 3

## 2019-04-02 NOTE — Progress Notes (Signed)
Pt. Was notified about the time change for surgery.He is aware that he needs to be on admitting at 9:45 am tomorrow,also he will drink the ensure at 9:15 am.Pt. verbalized his understanding of those changes.

## 2019-04-02 NOTE — Anesthesia Preprocedure Evaluation (Addendum)
Anesthesia Evaluation  Patient identified by MRN, date of birth, ID band Patient awake    Reviewed: Allergy & Precautions, H&P , NPO status , Patient's Chart, lab work & pertinent test results  Airway Mallampati: II  TM Distance: >3 FB Neck ROM: Full    Dental no notable dental hx. (+) Teeth Intact, Dental Advisory Given   Pulmonary neg pulmonary ROS, former smoker,    Pulmonary exam normal breath sounds clear to auscultation       Cardiovascular hypertension, Pt. on medications Normal cardiovascular exam Rhythm:Regular Rate:Normal     Neuro/Psych  Headaches, negative psych ROS   GI/Hepatic Neg liver ROS,   Endo/Other  negative endocrine ROS  Renal/GU K+ 4.1 Cr 1.29     Musculoskeletal  (+) Arthritis ,   Abdominal (+) + obese,   Peds  Hematology hgb 14.2 plt 277   Anesthesia Other Findings   Reproductive/Obstetrics negative OB ROS                            Anesthesia Physical Anesthesia Plan  ASA: III  Anesthesia Plan: Spinal   Post-op Pain Management:    Induction:   PONV Risk Score and Plan: 3 and Treatment may vary due to age or medical condition and Ondansetron  Airway Management Planned: Natural Airway and Simple Face Mask  Additional Equipment: None  Intra-op Plan:   Post-operative Plan:   Informed Consent: I have reviewed the patients History and Physical, chart, labs and discussed the procedure including the risks, benefits and alternatives for the proposed anesthesia with the patient or authorized representative who has indicated his/her understanding and acceptance.     Dental advisory given  Plan Discussed with:   Anesthesia Plan Comments:        Anesthesia Quick Evaluation

## 2019-04-03 ENCOUNTER — Ambulatory Visit (HOSPITAL_COMMUNITY): Payer: 59 | Admitting: Anesthesiology

## 2019-04-03 ENCOUNTER — Ambulatory Visit (HOSPITAL_COMMUNITY): Payer: 59

## 2019-04-03 ENCOUNTER — Encounter (HOSPITAL_COMMUNITY)
Admission: RE | Disposition: A | Discharge status: Home / Self Care | Payer: Self-pay | Source: Home / Self Care | Attending: Orthopaedic Surgery

## 2019-04-03 ENCOUNTER — Encounter (HOSPITAL_COMMUNITY): Payer: Self-pay | Admitting: Orthopaedic Surgery

## 2019-04-03 ENCOUNTER — Observation Stay (HOSPITAL_COMMUNITY): Payer: 59

## 2019-04-03 ENCOUNTER — Other Ambulatory Visit: Payer: Self-pay

## 2019-04-03 ENCOUNTER — Observation Stay (HOSPITAL_COMMUNITY)
Admission: RE | Admit: 2019-04-03 | Discharge: 2019-04-04 | Disposition: A | Discharge status: Home / Self Care | Payer: 59 | Attending: Orthopaedic Surgery | Admitting: Orthopaedic Surgery

## 2019-04-03 DIAGNOSIS — E8881 Metabolic syndrome: Secondary | ICD-10-CM | POA: Insufficient documentation

## 2019-04-03 DIAGNOSIS — Z79899 Other long term (current) drug therapy: Secondary | ICD-10-CM | POA: Insufficient documentation

## 2019-04-03 DIAGNOSIS — I10 Essential (primary) hypertension: Secondary | ICD-10-CM | POA: Insufficient documentation

## 2019-04-03 DIAGNOSIS — M1611 Unilateral primary osteoarthritis, right hip: Principal | ICD-10-CM

## 2019-04-03 DIAGNOSIS — Z96641 Presence of right artificial hip joint: Secondary | ICD-10-CM

## 2019-04-03 DIAGNOSIS — E781 Pure hyperglyceridemia: Secondary | ICD-10-CM | POA: Diagnosis not present

## 2019-04-03 DIAGNOSIS — Z87891 Personal history of nicotine dependence: Secondary | ICD-10-CM | POA: Insufficient documentation

## 2019-04-03 DIAGNOSIS — Z6839 Body mass index (BMI) 39.0-39.9, adult: Secondary | ICD-10-CM | POA: Insufficient documentation

## 2019-04-03 DIAGNOSIS — F319 Bipolar disorder, unspecified: Secondary | ICD-10-CM | POA: Insufficient documentation

## 2019-04-03 DIAGNOSIS — Z419 Encounter for procedure for purposes other than remedying health state, unspecified: Secondary | ICD-10-CM

## 2019-04-03 HISTORY — PX: TOTAL HIP ARTHROPLASTY: SHX124

## 2019-04-03 SURGERY — ARTHROPLASTY, HIP, TOTAL, ANTERIOR APPROACH
Anesthesia: Spinal | Site: Hip | Laterality: Right

## 2019-04-03 MED ORDER — HYDROCHLOROTHIAZIDE 12.5 MG PO CAPS
12.5000 mg | ORAL_CAPSULE | Freq: Every day | ORAL | Status: DC
  Administered 2019-04-04: 12.5 mg via ORAL
  Filled 2019-04-03: qty 1

## 2019-04-03 MED ORDER — ONDANSETRON HCL 4 MG/2ML IJ SOLN: 4.0000 mg | Freq: Once | INTRAMUSCULAR | Status: DC | PRN

## 2019-04-03 MED ORDER — PHENOL 1.4 % MT LIQD: 1.0000 | OROMUCOSAL | Status: DC | PRN

## 2019-04-03 MED ORDER — BUPIVACAINE IN DEXTROSE 0.75-8.25 % IT SOLN
INTRATHECAL | Status: DC | PRN
  Administered 2019-04-03: 1.8 mL via INTRATHECAL

## 2019-04-03 MED ORDER — PROPOFOL 500 MG/50ML IV EMUL
INTRAVENOUS | Status: DC | PRN
  Administered 2019-04-03: 100 ug/kg/min via INTRAVENOUS

## 2019-04-03 MED ORDER — HYDROMORPHONE HCL 1 MG/ML IJ SOLN: 0.2500 mg | INTRAMUSCULAR | Status: DC | PRN

## 2019-04-03 MED ORDER — LISINOPRIL 10 MG PO TABS
10.0000 mg | ORAL_TABLET | Freq: Every day | ORAL | Status: DC
  Administered 2019-04-04: 10 mg via ORAL
  Filled 2019-04-03: qty 1

## 2019-04-03 MED ORDER — ATORVASTATIN CALCIUM 40 MG PO TABS
40.0000 mg | ORAL_TABLET | Freq: Every day | ORAL | Status: DC
  Administered 2019-04-03 – 2019-04-04 (×2): 40 mg via ORAL
  Filled 2019-04-03 (×2): qty 1

## 2019-04-03 MED ORDER — DIPHENHYDRAMINE HCL 12.5 MG/5ML PO ELIX
12.5000 mg | ORAL_SOLUTION | ORAL | Status: DC | PRN
  Administered 2019-04-03: 25 mg via ORAL
  Administered 2019-04-04: 12.5 mg via ORAL
  Filled 2019-04-03: qty 5
  Filled 2019-04-03: qty 10

## 2019-04-03 MED ORDER — METOCLOPRAMIDE HCL 5 MG/ML IJ SOLN: 5.0000 mg | Freq: Three times a day (TID) | INTRAMUSCULAR | Status: DC | PRN

## 2019-04-03 MED ORDER — ONDANSETRON HCL 4 MG/2ML IJ SOLN: 4.0000 mg | Freq: Four times a day (QID) | INTRAMUSCULAR | Status: DC | PRN

## 2019-04-03 MED ORDER — PANTOPRAZOLE SODIUM 40 MG PO TBEC
40.0000 mg | DELAYED_RELEASE_TABLET | Freq: Every day | ORAL | Status: DC
  Administered 2019-04-03 – 2019-04-04 (×2): 40 mg via ORAL
  Filled 2019-04-03 (×2): qty 1

## 2019-04-03 MED ORDER — PROPOFOL 10 MG/ML IV BOLUS
INTRAVENOUS | Status: AC
  Filled 2019-04-03: qty 20

## 2019-04-03 MED ORDER — CHLORHEXIDINE GLUCONATE 4 % EX LIQD
60.0000 mL | Freq: Once | CUTANEOUS | Status: AC
  Administered 2019-04-03: 4 via TOPICAL

## 2019-04-03 MED ORDER — LACTATED RINGERS IV SOLN: INTRAVENOUS | Status: DC

## 2019-04-03 MED ORDER — MIDAZOLAM HCL 2 MG/2ML IJ SOLN
INTRAMUSCULAR | Status: AC
  Filled 2019-04-03: qty 2

## 2019-04-03 MED ORDER — MAGNESIUM OXIDE 400 (241.3 MG) MG PO TABS
400.0000 mg | ORAL_TABLET | Freq: Every day | ORAL | Status: DC
  Administered 2019-04-03 – 2019-04-04 (×2): 400 mg via ORAL
  Filled 2019-04-03 (×2): qty 1

## 2019-04-03 MED ORDER — ACETAMINOPHEN 325 MG PO TABS
325.0000 mg | ORAL_TABLET | Freq: Four times a day (QID) | ORAL | Status: DC | PRN
  Administered 2019-04-04: 650 mg via ORAL
  Filled 2019-04-03: qty 2

## 2019-04-03 MED ORDER — ONDANSETRON HCL 4 MG/2ML IJ SOLN
INTRAMUSCULAR | Status: DC | PRN
  Administered 2019-04-03: 4 mg via INTRAVENOUS

## 2019-04-03 MED ORDER — OXYCODONE HCL 5 MG PO TABS
10.0000 mg | ORAL_TABLET | ORAL | Status: DC | PRN
  Administered 2019-04-03 – 2019-04-04 (×2): 15 mg via ORAL
  Administered 2019-04-04: 10 mg via ORAL
  Administered 2019-04-04: 15 mg via ORAL
  Filled 2019-04-03: qty 3
  Filled 2019-04-03: qty 2
  Filled 2019-04-03 (×2): qty 3
  Filled 2019-04-03: qty 2

## 2019-04-03 MED ORDER — LISINOPRIL-HYDROCHLOROTHIAZIDE 10-12.5 MG PO TABS: 1.0000 | ORAL_TABLET | Freq: Every day | ORAL | Status: DC

## 2019-04-03 MED ORDER — METOCLOPRAMIDE HCL 5 MG PO TABS: 5.0000 mg | ORAL_TABLET | Freq: Three times a day (TID) | ORAL | Status: DC | PRN

## 2019-04-03 MED ORDER — OXYCODONE HCL 5 MG/5ML PO SOLN: 5.0000 mg | Freq: Once | ORAL | Status: DC | PRN

## 2019-04-03 MED ORDER — OXYCODONE HCL 5 MG PO TABS: 5.0000 mg | ORAL_TABLET | Freq: Once | ORAL | Status: DC | PRN

## 2019-04-03 MED ORDER — PROPOFOL 500 MG/50ML IV EMUL
INTRAVENOUS | Status: AC
  Filled 2019-04-03: qty 50

## 2019-04-03 MED ORDER — ONDANSETRON HCL 4 MG PO TABS
4.0000 mg | ORAL_TABLET | Freq: Four times a day (QID) | ORAL | Status: DC | PRN
  Administered 2019-04-03: 4 mg via ORAL
  Filled 2019-04-03: qty 1

## 2019-04-03 MED ORDER — DEXAMETHASONE SODIUM PHOSPHATE 10 MG/ML IJ SOLN
INTRAMUSCULAR | Status: DC | PRN
  Administered 2019-04-03: 10 mg via INTRAVENOUS

## 2019-04-03 MED ORDER — SODIUM CHLORIDE 0.9 % IR SOLN
Status: DC | PRN
  Administered 2019-04-03: 1000 mL

## 2019-04-03 MED ORDER — MENTHOL 3 MG MT LOZG: 1.0000 | LOZENGE | OROMUCOSAL | Status: DC | PRN

## 2019-04-03 MED ORDER — QUETIAPINE FUMARATE 50 MG PO TABS
100.0000 mg | ORAL_TABLET | Freq: Every day | ORAL | Status: DC
  Administered 2019-04-03: 100 mg via ORAL
  Filled 2019-04-03: qty 2

## 2019-04-03 MED ORDER — POLYETHYLENE GLYCOL 3350 17 G PO PACK: 17.0000 g | PACK | Freq: Every day | ORAL | Status: DC | PRN

## 2019-04-03 MED ORDER — MIDAZOLAM HCL 2 MG/2ML IJ SOLN
INTRAMUSCULAR | Status: DC | PRN
  Administered 2019-04-03: 2 mg via INTRAVENOUS

## 2019-04-03 MED ORDER — ASPIRIN 81 MG PO CHEW
81.0000 mg | CHEWABLE_TABLET | Freq: Two times a day (BID) | ORAL | Status: DC
  Administered 2019-04-03 – 2019-04-04 (×2): 81 mg via ORAL
  Filled 2019-04-03 (×2): qty 1

## 2019-04-03 MED ORDER — MEPERIDINE HCL 50 MG/ML IJ SOLN: 6.2500 mg | INTRAMUSCULAR | Status: DC | PRN

## 2019-04-03 MED ORDER — OXYCODONE HCL 5 MG PO TABS
5.0000 mg | ORAL_TABLET | ORAL | Status: DC | PRN
  Administered 2019-04-03 – 2019-04-04 (×2): 10 mg via ORAL
  Filled 2019-04-03: qty 2

## 2019-04-03 MED ORDER — EPHEDRINE 5 MG/ML INJ
INTRAVENOUS | Status: AC
  Filled 2019-04-03: qty 10

## 2019-04-03 MED ORDER — ACETAMINOPHEN 10 MG/ML IV SOLN: 1000.0000 mg | Freq: Once | INTRAVENOUS | Status: DC | PRN

## 2019-04-03 MED ORDER — FENTANYL CITRATE (PF) 100 MCG/2ML IJ SOLN
INTRAMUSCULAR | Status: AC
  Filled 2019-04-03: qty 2

## 2019-04-03 MED ORDER — DOCUSATE SODIUM 100 MG PO CAPS
100.0000 mg | ORAL_CAPSULE | Freq: Two times a day (BID) | ORAL | Status: DC
  Administered 2019-04-03 – 2019-04-04 (×2): 100 mg via ORAL
  Filled 2019-04-03 (×2): qty 1

## 2019-04-03 MED ORDER — SODIUM CHLORIDE 0.9 % IV SOLN: INTRAVENOUS | Status: DC

## 2019-04-03 MED ORDER — POTASSIUM GLUCONATE 595 (99 K) MG PO TABS
595.0000 mg | ORAL_TABLET | Freq: Every day | ORAL | Status: DC
  Administered 2019-04-03 – 2019-04-04 (×2): 595 mg via ORAL
  Filled 2019-04-03 (×2): qty 1

## 2019-04-03 MED ORDER — GABAPENTIN 100 MG PO CAPS
100.0000 mg | ORAL_CAPSULE | Freq: Three times a day (TID) | ORAL | Status: DC
  Administered 2019-04-03 – 2019-04-04 (×3): 100 mg via ORAL
  Filled 2019-04-03 (×3): qty 1

## 2019-04-03 MED ORDER — ONDANSETRON HCL 4 MG/2ML IJ SOLN
INTRAMUSCULAR | Status: AC
  Filled 2019-04-03: qty 2

## 2019-04-03 MED ORDER — METHOCARBAMOL 500 MG IVPB - SIMPLE MED
500.0000 mg | Freq: Four times a day (QID) | INTRAVENOUS | Status: DC | PRN
  Filled 2019-04-03: qty 50

## 2019-04-03 MED ORDER — 0.9 % SODIUM CHLORIDE (POUR BTL) OPTIME
TOPICAL | Status: DC | PRN
  Administered 2019-04-03: 1000 mL

## 2019-04-03 MED ORDER — DEXAMETHASONE SODIUM PHOSPHATE 10 MG/ML IJ SOLN
INTRAMUSCULAR | Status: AC
  Filled 2019-04-03: qty 1

## 2019-04-03 MED ORDER — POVIDONE-IODINE 10 % EX SWAB
2.0000 "application " | Freq: Once | CUTANEOUS | Status: AC
  Administered 2019-04-03: 2 via TOPICAL

## 2019-04-03 MED ORDER — FENTANYL CITRATE (PF) 100 MCG/2ML IJ SOLN
INTRAMUSCULAR | Status: DC | PRN
  Administered 2019-04-03 (×2): 50 ug via INTRAVENOUS

## 2019-04-03 MED ORDER — TRANEXAMIC ACID-NACL 1000-0.7 MG/100ML-% IV SOLN
1000.0000 mg | INTRAVENOUS | Status: AC
  Administered 2019-04-03: 1000 mg via INTRAVENOUS
  Filled 2019-04-03: qty 100

## 2019-04-03 MED ORDER — LIDOCAINE 2% (20 MG/ML) 5 ML SYRINGE
INTRAMUSCULAR | Status: AC
  Filled 2019-04-03: qty 5

## 2019-04-03 MED ORDER — CEFAZOLIN SODIUM-DEXTROSE 2-4 GM/100ML-% IV SOLN
2.0000 g | Freq: Four times a day (QID) | INTRAVENOUS | Status: AC
  Administered 2019-04-03 – 2019-04-04 (×2): 2 g via INTRAVENOUS
  Filled 2019-04-03 (×2): qty 100

## 2019-04-03 MED ORDER — METHOCARBAMOL 500 MG PO TABS
500.0000 mg | ORAL_TABLET | Freq: Four times a day (QID) | ORAL | Status: DC | PRN
  Administered 2019-04-03 – 2019-04-04 (×4): 500 mg via ORAL
  Filled 2019-04-03 (×4): qty 1

## 2019-04-03 MED ORDER — HYDROMORPHONE HCL 1 MG/ML IJ SOLN
0.5000 mg | INTRAMUSCULAR | Status: DC | PRN
  Administered 2019-04-03: 1 mg via INTRAVENOUS
  Filled 2019-04-03: qty 1

## 2019-04-03 SURGICAL SUPPLY — 41 items
ACETAB CUP W/GRIPTION 54 (Plate) ×2 IMPLANT
APL SKNCLS STERI-STRIP NONHPOA (GAUZE/BANDAGES/DRESSINGS)
BAG SPEC THK2 15X12 ZIP CLS (MISCELLANEOUS)
BAG ZIPLOCK 12X15 (MISCELLANEOUS) IMPLANT
BENZOIN TINCTURE PRP APPL 2/3 (GAUZE/BANDAGES/DRESSINGS) IMPLANT
BLADE SAW SGTL 18X1.27X75 (BLADE) ×2 IMPLANT
COVER PERINEAL POST (MISCELLANEOUS) ×2 IMPLANT
COVER SURGICAL LIGHT HANDLE (MISCELLANEOUS) ×2 IMPLANT
COVER WAND RF STERILE (DRAPES) ×2 IMPLANT
CUP ACETAB W/GRIPTION 54 (Plate) IMPLANT
DRAPE STERI IOBAN 125X83 (DRAPES) ×2 IMPLANT
DRAPE U-SHAPE 47X51 STRL (DRAPES) ×4 IMPLANT
DRSG AQUACEL AG ADV 3.5X10 (GAUZE/BANDAGES/DRESSINGS) ×2 IMPLANT
DURAPREP 26ML APPLICATOR (WOUND CARE) ×2 IMPLANT
ELECT REM PT RETURN 15FT ADLT (MISCELLANEOUS) ×2 IMPLANT
GAUZE XEROFORM 1X8 LF (GAUZE/BANDAGES/DRESSINGS) ×2 IMPLANT
GLOVE BIO SURGEON STRL SZ7.5 (GLOVE) ×2 IMPLANT
GLOVE BIOGEL PI IND STRL 8 (GLOVE) ×2 IMPLANT
GLOVE BIOGEL PI INDICATOR 8 (GLOVE) ×2
GLOVE ECLIPSE 8.0 STRL XLNG CF (GLOVE) ×2 IMPLANT
GOWN STRL REUS W/TWL XL LVL3 (GOWN DISPOSABLE) ×4 IMPLANT
HANDPIECE INTERPULSE COAX TIP (DISPOSABLE) ×2
HEAD CERAMIC 36 PLUS 8.5 12 14 (Hips) ×1 IMPLANT
HOLDER FOLEY CATH W/STRAP (MISCELLANEOUS) ×2 IMPLANT
KIT TURNOVER KIT A (KITS) ×1 IMPLANT
LINER NEUTRAL 54X36MM PLUS 4 (Hips) ×1 IMPLANT
PACK ANTERIOR HIP CUSTOM (KITS) ×2 IMPLANT
PENCIL SMOKE EVACUATOR (MISCELLANEOUS) IMPLANT
SET HNDPC FAN SPRY TIP SCT (DISPOSABLE) ×1 IMPLANT
STAPLER VISISTAT 35W (STAPLE) ×1 IMPLANT
STEM FEMORAL SZ5 HIGH ACTIS (Stem) ×1 IMPLANT
STRIP CLOSURE SKIN 1/2X4 (GAUZE/BANDAGES/DRESSINGS) IMPLANT
SUT ETHIBOND NAB CT1 #1 30IN (SUTURE) ×2 IMPLANT
SUT ETHILON 2 0 PS N (SUTURE) IMPLANT
SUT MNCRL AB 4-0 PS2 18 (SUTURE) IMPLANT
SUT VIC AB 0 CT1 36 (SUTURE) ×2 IMPLANT
SUT VIC AB 1 CT1 36 (SUTURE) ×2 IMPLANT
SUT VIC AB 2-0 CT1 27 (SUTURE) ×4
SUT VIC AB 2-0 CT1 TAPERPNT 27 (SUTURE) ×2 IMPLANT
TRAY FOLEY MTR SLVR 16FR STAT (SET/KITS/TRAYS/PACK) IMPLANT
YANKAUER SUCT BULB TIP 10FT TU (MISCELLANEOUS) ×2 IMPLANT

## 2019-04-03 NOTE — Evaluation (Signed)
Physical Therapy Evaluation Patient Details Name: Tim [name] MRN: [id number] DOB: July 01, 1965 Today's Date: 04/03/2019   History of Present Illness  Patient is 54 y.o. male s/p Rt THA anterior approach on 04/03/19 with PMH significant for HTN, HLD, depression, anxiety, OA, bipolar disorder, lumbar decompression.    Clinical Impression  FERREL SIMINGTON is a 54 y.o. male POD 0 s/p Rt THA. Patient reports independence with mobility at baseline with recent use of rollator due to increase in hip pain. Patient is now limited by functional impairments (see PT problem list below) and requires min assist for transfers and gait with RW. Patient was able to ambulate ~50 feet with RW and min guard/assist. Patient will benefit from continued skilled PT interventions to address impairments and progress towards PLOF. Acute PT will follow to progress mobility and stair training in preparation for safe discharge home.     Follow Up Recommendations Follow surgeon's recommendation for DC plan and follow-up therapies    Equipment Recommendations  Rolling walker with 5" wheels    Recommendations for Other Services       Precautions / Restrictions Precautions Precautions: Fall Restrictions Weight Bearing Restrictions: No RLE Weight Bearing: Weight bearing as tolerated      Mobility  Bed Mobility Overal bed mobility: Needs Assistance Bed Mobility: Supine to Sit     Supine to sit: Min assist     General bed mobility comments: cues for use of bed rail, assist for Rt LE mobility  Transfers Overall transfer level: Needs assistance Equipment used: Rolling walker (2 wheeled) Transfers: Sit to/from Stand Sit to Stand: Min assist;From elevated surface         General transfer comment: cues for safe hand placement on RW and assist for power up, pt steady with rising  Ambulation/Gait Ambulation/Gait assistance: Min guard;Min assist Gait Distance (Feet): 50 Feet Assistive device: Rolling walker  (2 wheeled) Gait Pattern/deviations: Step-to pattern;Decreased stance time - right;Decreased weight shift to right;Antalgic Gait velocity: decreased   General Gait Details: cues for step pattern and safe proximity to RW at start of session, pt with antalgia and reliant on UE support to reduce weight bearing in Rt LE. no overt LOB noted, intermittent min assist to manage RW.  Stairs       Wheelchair Mobility    Modified Rankin (Stroke Patients Only)       Balance Overall balance assessment: Needs assistance Sitting-balance support: Feet supported Sitting balance-Leahy Scale: Good     Standing balance support: Bilateral upper extremity supported;During functional activity Standing balance-Leahy Scale: Fair                  Pertinent Vitals/Pain Pain Assessment: 0-10 Pain Score: 8  Pain Location: Rthip Pain Descriptors / Indicators: Aching Pain Intervention(s): Monitored during session;Limited activity within patient's tolerance;Repositioned;Ice applied;Premedicated before session    Home Living Family/patient expects to be discharged to:: Private residence Living Arrangements: Children Available Help at Discharge: Family(pt has 52 y.o. and 52 y.o. and his parents are avilable) Type of Home: House Home Access: Stairs to enter Entrance Stairs-Rails: None Entrance Stairs-Number of Steps: 1 Home Layout: One level Home Equipment: Dolores - single point;Walker - 4 wheels      Prior Function Level of Independence: Independent with assistive device(s)         Comments: using RW recently     Hand Dominance   Dominant Hand: Right    Extremity/Trunk Assessment   Upper Extremity Assessment Upper Extremity Assessment: Overall WFL for tasks  assessed    Lower Extremity Assessment Lower Extremity Assessment: RLE deficits/detail RLE Deficits / Details: good quad activation RLE Sensation: WNL RLE Coordination: WNL    Cervical / Trunk Assessment Cervical / Trunk  Assessment: Normal  Communication   Communication: No difficulties  Cognition Arousal/Alertness: Awake/alert Behavior During Therapy: WFL for tasks assessed/performed Overall Cognitive Status: Within Functional Limits for tasks assessed           General Comments      Exercises     Assessment/Plan    PT Assessment Patient needs continued PT services  PT Problem List Decreased strength;Decreased range of motion;Decreased activity tolerance;Decreased balance;Decreased mobility;Decreased knowledge of use of DME;Pain       PT Treatment Interventions DME instruction;Gait training;Stair training;Therapeutic activities;Functional mobility training;Therapeutic exercise;Balance training;Patient/family education    PT Goals (Current goals can be found in the Care Plan section)  Acute Rehab PT Goals Patient Stated Goal: to be able to walk at work without pain PT Goal Formulation: With patient Time For Goal Achievement: 04/10/19 Potential to Achieve Goals: Good    Frequency 7X/week    AM-PAC PT "6 Clicks" Mobility  Outcome Measure Help needed turning from your back to your side while in a flat bed without using bedrails?: A Little Help needed moving from lying on your back to sitting on the side of a flat bed without using bedrails?: A Little Help needed moving to and from a bed to a chair (including a wheelchair)?: A Little Help needed standing up from a chair using your arms (e.g., wheelchair or bedside chair)?: A Little Help needed to walk in hospital room?: A Little Help needed climbing 3-5 steps with a railing? : A Little 6 Click Score: 18    End of Session Equipment Utilized During Treatment: Gait belt Activity Tolerance: Patient tolerated treatment well Patient left: in chair;with call bell/phone within reach;with chair alarm set Nurse Communication: Mobility status PT Visit Diagnosis: Muscle weakness (generalized) (M62.81);Difficulty in walking, not elsewhere classified  (R26.2);Pain Pain - Right/Left: Right Pain - part of body: Hip    Time: 2703-5009 PT Time Calculation (min) (ACUTE ONLY): 21 min   Charges:   PT Evaluation $PT Eval Low Complexity: 1 Low          Wynn Maudlin, DPT Physical Therapist with [facility] [phone]  04/03/2019 7:40 PM

## 2019-04-03 NOTE — Anesthesia Postprocedure Evaluation (Signed)
Anesthesia Post Note  Patient: Tim [name]  Procedure(s) Performed: RIGHT TOTAL HIP ARTHROPLASTY ANTERIOR APPROACH (Right Hip)     Patient location during evaluation: Nursing Unit Anesthesia Type: Spinal Level of consciousness: oriented and awake and alert Pain management: pain level controlled Vital Signs Assessment: post-procedure vital signs reviewed and stable Respiratory status: spontaneous breathing and respiratory function stable Cardiovascular status: blood pressure returned to baseline and stable Postop Assessment: no headache, no backache, no apparent nausea or vomiting and patient able to bend at knees Anesthetic complications: no    Last Vitals:  Vitals:   04/03/19 1651 04/03/19 1716  BP: 126/74 126/68  Pulse: 76 68  Resp: 14 14  Temp:  36.8 C  SpO2: 100% 100%    Last Pain:  Vitals:   04/03/19 1645  TempSrc:   PainSc: 0-No pain                 Trevor Iha

## 2019-04-03 NOTE — Transfer of Care (Signed)
Immediate Anesthesia Transfer of Care Note  Patient: Tim [name]  Procedure(s) Performed: RIGHT TOTAL HIP ARTHROPLASTY ANTERIOR APPROACH (Right Hip)  Patient Location: PACU  Anesthesia Type:Spinal  Level of Consciousness: awake, alert  and oriented  Airway & Oxygen Therapy: Patient Spontanous Breathing and Patient connected to face mask oxygen  Post-op Assessment: Report given to RN and Post -op Vital signs reviewed and stable  Post vital signs: Reviewed and stable  Last Vitals:  Vitals Value Taken Time  BP 121/67 04/03/19 1427  Temp    Pulse 83 04/03/19 1429  Resp 17 04/03/19 1429  SpO2 98 % 04/03/19 1429  Vitals shown include unvalidated device data.  Last Pain:  Vitals:   04/03/19 1022  TempSrc:   PainSc: 2       Patients Stated Pain Goal: 3 (04/03/19 1022)  Complications: No apparent anesthesia complications

## 2019-04-03 NOTE — Op Note (Signed)
NAME: Tim [name], COLL MEDICAL RECORD IP:[id number] ACCOUNT [id number] DATE OF BIRTH:03-May-1965 FACILITY: [facility] LOCATION: [facility] PHYSICIAN:Rael Tilly Kerry Fort, MD  OPERATIVE REPORT  DATE OF PROCEDURE:  04/03/2019  PREOPERATIVE DIAGNOSIS:  Primary osteoarthritis and degenerative joint disease, right hip.  POSTOPERATIVE DIAGNOSIS:  Primary osteoarthritis and degenerative joint disease, right hip.  PROCEDURE:  Right total hip arthroplasty through direct anterior approach.  IMPLANTS:  DePuy Sector Gription acetabular component size 54, size 36+4 neutral polyethylene liner, size 5 Actis femoral component with high offset, size 36+8.5 ceramic hip ball.  SURGEON:  Lind Guest.  Ninfa Linden, MD  ASSISTANT:  Erskine Emery, PA-C  ANESTHESIA:  Spinal.  ANTIBIOTICS:  Three g IV Ancef.  BLOOD LOSS:  250 mL.  COMPLICATIONS:  None.  INDICATIONS:  The patient is a 54 year old gentleman, with a BMI of 39 and severe osteoarthritis of his right hip.  Surprisingly, his hip only started hurting over the last several months.  An MRI confirmed severe arthritis of the hip.  He has lost a  significant amount of weight on the Keto diet.  At this point, his right hip pain is severe and it is daily.  It is detrimentally affecting his mobility, his quality of life and his activities of daily living to the point he does wish to proceed with hip  replacement surgery on the right side.  We had a long and thorough discussion about the surgery.  We talked about the risk of acute blood loss anemia, nerve or vessel injury, fracture, infection, dislocation, DVT and implant failure.  We talked about  these being heightened given his obesity.  We also talked about the goals being hopefully to decrease pain, improve mobility and overall improve quality of life.  DESCRIPTION OF PROCEDURE:  After informed consent was obtained and appropriate right hip was marked, he was brought to the operating room and sat up on a  stretcher where spinal anesthesia was obtained.  He was laid in the supine position on a stretcher.   Foley catheter was placed and traction boots were placed on both his feet.  Next, he was placed supine on the Hana fracture table, the perineal post in place and both legs in line skeletal traction device and no traction applied.  His right operative  hip was prepped and draped with DuraPrep and sterile drapes.  A time-out was called and he was identified as correct patient, correct right hip.  I then made an incision just inferior and posterior to the anterior iliac spine and carried this obliquely  down the leg.  We dissected down to tensor fascia lata muscle.  Tensor fascia was then divided longitudinally to proceed with direct anterior approach to the hip.  We identified and cauterized circumflex vessels and identified the hip capsule, opened the  hip capsule in an L-type format, finding a moderate joint effusion and significant periarticular osteophytes around the femoral head and neck.  I made my femoral neck cut just proximal to the lesser trochanter with an oscillating saw and completed this  with an osteotome.  We placed a corkscrew guide in the femoral head and removed the femoral head in its entirety and found a wide area devoid of cartilage.  I then placed a bent Hohmann over the medial acetabular rim and removed remnants of the  acetabular labrum and other debris.  We then began reaming from a size 44 reamer in stepwise increments up to a size 53, with all reamers under direct visualization, the last reamer under direct  fluoroscopy, so we could obtain our depth of reaming, our  inclination and anteversion.  I then placed the real DePuy Sector Gription acetabular component size 54 and due to some slight medialization, I went with a 36+4 polyethylene liner for that size 54 acetabular component.  Attention was then turned to the  femur.  With the leg externally rotated to 120 degrees, extended and  adducted, we were able to place a Mueller retractor medially and a Hohman retractor behind the greater trochanter, released the lateral joint capsule and used a box-cutting osteotome to  enter the femoral canal and a rongeur to lateralize.  We then began broaching using the Actis broaching system from a size zero going up to a size 5.  With the  size 5 in place, we trialed a standard offset femoral neck and a 36+1.5 hip ball, reduced  this in the acetabulum and we definitely needed more leg length and offset.  We dislocated the hip and removed the trial components.  I then placed the real high offset Actis femoral component size 5 and then we went with a 36+8.5 ceramic hip ball,  reduced this in the acetabulum and I was pleased with the range of motion and stability, as well as offset and leg length assessed radiographically and mechanically.  We then irrigated the soft tissue with normal saline solution.  We were able to  reapproximate the joint capsule with interrupted #1 Ethibond suture.  We closed the tensor fascia with #1 Vicryl, followed by 0 Vicryl to close the deep tissue, 2-0 Vicryl to close the subcutaneous tissue and reapproximated the skin with staples.   Xeroform and Aquacel dressing was applied.  He was taken off the Hana table and taken to the recovery room in stable condition.  All final counts were correct.  There were no complications noted.  Of note, Rexene Edison, PA-C, assisted during the entire  case.  His assistance was crucial for facilitating all aspects of this case.  VN/NUANCE  D:04/03/2019 T:04/03/2019 JOB:[id number]

## 2019-04-03 NOTE — Anesthesia Procedure Notes (Signed)
Spinal  Patient location during procedure: OR Start time: 04/03/2019 12:53 PM End time: 04/03/2019 12:58 PM Staffing Anesthesiologist: Trevor Iha, MD Preanesthetic Checklist Completed: patient identified, IV checked, site marked, risks and benefits discussed, surgical consent, monitors and equipment checked, pre-op evaluation and timeout performed Spinal Block Patient position: sitting Prep: DuraPrep Patient monitoring: heart rate, cardiac monitor, continuous pulse ox and blood pressure Approach: midline Location: L3-4 Injection technique: single-shot Needle Needle type: Sprotte  Needle gauge: 24 G Needle length: 9 cm Needle insertion depth: 7 cm Assessment Sensory level: T4 Additional Notes 1 attempt. Pt tolerated procedure well.

## 2019-04-03 NOTE — Brief Op Note (Signed)
04/03/2019  2:13 PM  PATIENT:  Tim [name]  54 y.o. male  PRE-OPERATIVE DIAGNOSIS:  right hip osteoarthritis  POST-OPERATIVE DIAGNOSIS:  right hip osteoarthritis  PROCEDURE:  Procedure(s): RIGHT TOTAL HIP ARTHROPLASTY ANTERIOR APPROACH (Right)  SURGEON:  Surgeon(s) and Role:    Kathryne Hitch, MD - Primary  PHYSICIAN ASSISTANT:  Rexene Edison, PA-C  ANESTHESIA:   spinal  EBL:  250 mL   COUNTS:  YES  DICTATION: .Other Dictation: Dictation Number [id number]  PLAN OF CARE: Admit for overnight observation  PATIENT DISPOSITION:  PACU - hemodynamically stable.   Delay start of Pharmacological VTE agent (>24hrs) due to surgical blood loss or risk of bleeding: no

## 2019-04-03 NOTE — Anesthesia Procedure Notes (Signed)
Procedure Name: MAC Date/Time: 04/03/2019 12:58 PM Performed by: Eben Burow, CRNA Pre-anesthesia Checklist: Patient identified, Emergency Drugs available, Suction available, Patient being monitored and Timeout performed Oxygen Delivery Method: Simple face mask Dental Injury: Teeth and Oropharynx as per pre-operative assessment

## 2019-04-03 NOTE — Plan of Care (Signed)
  Problem: Health Behavior/Discharge Planning: Goal: Ability to manage health-related needs will improve Outcome: Progressing   Problem: Clinical Measurements: Goal: Ability to maintain clinical measurements within normal limits will improve Outcome: Progressing Goal: Will remain free from infection Outcome: Progressing Goal: Diagnostic test results will improve Outcome: Progressing Goal: Respiratory complications will improve Outcome: Progressing Goal: Cardiovascular complication will be avoided Outcome: Progressing   Problem: Education: Goal: Knowledge of General Education information will improve Description: Including pain rating scale, medication(s)/side effects and non-pharmacologic comfort measures Outcome: Progressing   Problem: Activity: Goal: Risk for activity intolerance will decrease Outcome: Progressing   Problem: Nutrition: Goal: Adequate nutrition will be maintained Outcome: Progressing   Problem: Coping: Goal: Level of anxiety will decrease Outcome: Progressing   Problem: Elimination: Goal: Will not experience complications related to bowel motility Outcome: Progressing Goal: Will not experience complications related to urinary retention Outcome: Progressing   Problem: Pain Managment: Goal: General experience of comfort will improve Outcome: Progressing   Problem: Safety: Goal: Ability to remain free from injury will improve Outcome: Progressing   Problem: Skin Integrity: Goal: Risk for impaired skin integrity will decrease Outcome: Progressing   Problem: Education: Goal: Knowledge of the prescribed therapeutic regimen will improve Outcome: Progressing Goal: Understanding of discharge needs will improve Outcome: Progressing Goal: Individualized Educational Video(s) Outcome: Progressing   Problem: Activity: Goal: Ability to avoid complications of mobility impairment will improve Outcome: Progressing Goal: Ability to tolerate increased  activity will improve Outcome: Progressing   Problem: Clinical Measurements: Goal: Postoperative complications will be avoided or minimized Outcome: Progressing   Problem: Pain Management: Goal: Pain level will decrease with appropriate interventions Outcome: Progressing   Problem: Skin Integrity: Goal: Will show signs of wound healing Outcome: Progressing

## 2019-04-03 NOTE — H&P (Signed)
TOTAL HIP ADMISSION H&P  Patient is admitted for right total hip arthroplasty.  Subjective:  Chief Complaint: right hip pain  HPI: Tim [name], 54 y.o. male, has a history of pain and functional disability in the right hip(s) due to arthritis and patient has failed non-surgical conservative treatments for greater than 12 weeks to include NSAID's and/or analgesics, corticosteriod injections, weight reduction as appropriate and activity modification.  Onset of symptoms was abrupt starting 1 years ago with rapidlly worsening course since that time.The patient noted no past surgery on the right hip(s).  Patient currently rates pain in the right hip at 10 out of 10 with activity. Patient has night pain, worsening of pain with activity and weight bearing, pain that interfers with activities of daily living and pain with passive range of motion. Patient has evidence of subchondral cysts, subchondral sclerosis, periarticular osteophytes and joint space narrowing by imaging studies. This condition presents safety issues increasing the risk of falls.  There is no current active infection.  Patient Active Problem List   Diagnosis Date Noted  . Unilateral primary osteoarthritis, right hip 04/03/2019  . Morbid obesity with BMI of 40.0-44.9, adult (HCC) 07/14/2017  . Mood disorder in conditions classified elsewhere 02/14/2017  . Alcohol use disorder, severe, in sustained remission (HCC) 02/14/2017  . Cocaine use disorder, severe, in sustained remission (HCC) 02/14/2017  . Chronic back pain greater than 3 months duration 01/24/2017  . Migraine headache without aura 01/24/2017  . Paronychia of left thumb 06/11/2016  . History of prior cigarette smoking 05/14/2016  . Substance abuse in remission (HCC) 05/14/2016  . Alcohol abuse, in remission 05/14/2016  . Pincer nail deformity 05/14/2016  . Class 2 obesity with body mass index (BMI) of 39.0 to 39.9 in adult 05/14/2016  . Hypertriglyceridemia 04/25/2010   . Metabolic syndrome 04/25/2010  . HTN (hypertension) 04/25/2010  . Bipolar 1 disorder (HCC) 04/25/2010   Past Medical History:  Diagnosis Date  . Anxiety   . Arthritis   . Bipolar disorder (HCC)   . Depression   . Hyperlipidemia   . Hypertension   . Substance abuse (HCC)    recovered - alcohol 2015, drugs 2012    Past Surgical History:  Procedure Laterality Date  . BACK SURGERY  2012  . EXCISION MASS UPPER EXTREMETIES Left 08/22/2017   Procedure: LEFT THUMB EXCISION MASS NAILBED WITH DEBRIDEMENT OF INTERPHALANGEAL JOINT;  Surgeon: Cindee Salt, MD;  Location: [facility];  Service: Orthopedics;  Laterality: Left;  . FRACTURE SURGERY     right hand  . HAND SURGERY  1988  . SPINE SURGERY     , and lumbar decompression  . TONSILLECTOMY AND ADENOIDECTOMY      Current Facility-Administered Medications  Medication Dose Route Frequency Provider Last Rate Last Admin  . ceFAZolin (ANCEF) 3 g in dextrose 5 % 50 mL IVPB  3 g Intravenous On Call to OR Kathryne Hitch, MD       Current Outpatient Medications  Medication Sig Dispense Refill Last Dose  . acetaminophen (TYLENOL) 500 MG tablet Take 1,500 mg by mouth in the morning, at noon, and at bedtime.      Marland Kitchen atorvastatin (LIPITOR) 40 MG tablet Take 1 tablet (40 mg total) by mouth daily. 90 tablet 3   . diclofenac (VOLTAREN) 75 MG EC tablet Take 75 mg by mouth 2 (two) times daily.      Marland Kitchen lisinopril-hydrochlorothiazide (PRINZIDE,ZESTORETIC) 10-12.5 MG tablet Take 1 tablet by mouth daily. 90 tablet  3   . magnesium oxide (MAG-OX) 400 MG tablet Take 400 mg by mouth daily.     . Melatonin 10 MG TABS Take 10 mg by mouth at bedtime.      . Omega-3 Fatty Acids (FISH OIL) 1000 MG CAPS Take 1,000 mg by mouth in the morning, at noon, and at bedtime.     . Potassium 99 MG TABS Take 99 mg by mouth daily.     . QUEtiapine (SEROQUEL) 100 MG tablet Take 100 mg by mouth at bedtime.     . cyclobenzaprine (FLEXERIL) 10 MG tablet Take  1 tablet (10 mg total) by mouth 3 (three) times daily as needed for muscle spasms. (Patient not taking: Reported on 03/23/2019) 90 tablet 0 Not Taking at Unknown time   No Known Allergies  Social History   Tobacco Use  . Smoking status: Former Research scientist (life sciences)  . Smokeless tobacco: Never Used  . Tobacco comment: quit smoking 6 years ago  Substance Use Topics  . Alcohol use: Yes    Comment: occasionally/ been through rehab    Family History  Problem Relation Age of Onset  . Arthritis Mother   . Cancer Father        barretts esophagus  . Depression Father   . Diabetes Father   . Hyperlipidemia Father   . Hypertension Father   . Stroke Maternal Grandmother   . Heart disease Maternal Grandfather 59  . Dementia Paternal Grandmother   . Diabetes Paternal Grandfather   . Depression Maternal Aunt   . Alcohol abuse Maternal Aunt   . Alcohol abuse Maternal Uncle   . Drug abuse Paternal Uncle      Review of Systems  All other systems reviewed and are negative.   Objective:  Physical Exam  Constitutional: He is oriented to person, place, and time. He appears well-developed and well-nourished.  HENT:  Head: Normocephalic and atraumatic.  Eyes: Pupils are equal, round, and reactive to light. EOM are normal.  Cardiovascular: Normal rate.  Respiratory: Effort normal.  GI: Soft.  Musculoskeletal:     Cervical back: Normal range of motion and neck supple.     Right hip: Tenderness and bony tenderness present. Decreased range of motion. Decreased strength.  Neurological: He is alert and oriented to person, place, and time.  Skin: Skin is warm and dry.  Psychiatric: He has a normal mood and affect.    Vital signs in last 24 hours:    Labs:   Estimated body mass index is 39.79 kg/m as calculated from the following:   Height as of 03/27/19: 5\' 10"  (1.778 m).   Weight as of 03/27/19: 125.8 kg.   Imaging Review Plain radiographs demonstrate severe degenerative joint disease of the right  hip(s). The bone quality appears to be excellent for age and reported activity level.      Assessment/Plan:  End stage arthritis, right hip(s)  The patient history, physical examination, clinical judgement of the provider and imaging studies are consistent with end stage degenerative joint disease of the right hip(s) and total hip arthroplasty is deemed medically necessary. The treatment options including medical management, injection therapy, arthroscopy and arthroplasty were discussed at length. The risks and benefits of total hip arthroplasty were presented and reviewed. The risks due to aseptic loosening, infection, stiffness, dislocation/subluxation,  thromboembolic complications and other imponderables were discussed.  The patient acknowledged the explanation, agreed to proceed with the plan and consent was signed. Patient is being admitted for inpatient treatment for surgery,  pain control, PT, OT, prophylactic antibiotics, VTE prophylaxis, progressive ambulation and ADL's and discharge planning.The patient is planning to be discharged home with home health services

## 2019-04-04 DIAGNOSIS — M1611 Unilateral primary osteoarthritis, right hip: Secondary | ICD-10-CM | POA: Diagnosis not present

## 2019-04-04 LAB — BASIC METABOLIC PANEL
Anion gap: 7 (ref 5–15)
BUN: 16 mg/dL (ref 6–20)
CO2: 25 mmol/L (ref 22–32)
Calcium: 8.5 mg/dL — ABNORMAL LOW (ref 8.9–10.3)
Chloride: 103 mmol/L (ref 98–111)
Creatinine, Ser: 0.86 mg/dL (ref 0.61–1.24)
GFR calc Af Amer: >60 mL/min (ref >60)
GFR calc non Af Amer: >60 mL/min (ref >60)
Glucose, Bld: 141 mg/dL — ABNORMAL HIGH (ref 70–99)
Potassium: 4.3 mmol/L (ref 3.5–5.1)
Sodium: 135 mmol/L (ref 135–145)

## 2019-04-04 LAB — CBC
HCT: 38.3 % — ABNORMAL LOW (ref 39.0–52.0)
Hemoglobin: 12.6 g/dL — ABNORMAL LOW (ref 13.0–17.0)
MCH: 29.5 pg (ref 26.0–34.0)
MCHC: 32.9 g/dL (ref 30.0–36.0)
MCV: 89.7 fL (ref 80.0–100.0)
Platelets: 243 10*3/uL (ref 150–400)
RBC: 4.27 MIL/uL (ref 4.22–5.81)
RDW: 11.2 % — ABNORMAL LOW (ref 11.5–15.5)
WBC: 12.4 10*3/uL — ABNORMAL HIGH (ref 4.0–10.5)
nRBC: 0 % (ref 0.0–0.2)

## 2019-04-04 MED ORDER — OXYCODONE HCL 5 MG PO TABS: 5.0000 mg | ORAL_TABLET | ORAL | 0 refills | Status: DC | PRN

## 2019-04-04 MED ORDER — METHOCARBAMOL 500 MG PO TABS: 500.0000 mg | ORAL_TABLET | Freq: Four times a day (QID) | ORAL | 1 refills | Status: DC | PRN

## 2019-04-04 MED ORDER — ASPIRIN 81 MG PO CHEW: 81.0000 mg | CHEWABLE_TABLET | Freq: Two times a day (BID) | ORAL | 0 refills | Status: DC

## 2019-04-04 NOTE — Discharge Summary (Signed)
Patient ID: Tim [name] MRN: [id number] DOB/AGE: 52-Jan-1967 52 y.o.  Admit date: 04/03/2019 Discharge date: 04/04/2019  Admission Diagnoses:  Principal Problem:   Unilateral primary osteoarthritis, right hip Active Problems:   Status post total replacement of right hip   Discharge Diagnoses:  Same  Past Medical History:  Diagnosis Date  . Anxiety   . Arthritis   . Bipolar disorder (HCC)   . Depression   . Hyperlipidemia   . Hypertension   . Substance abuse (HCC)    recovered - alcohol 2015, drugs 2012    Surgeries: Procedure(s): RIGHT TOTAL HIP ARTHROPLASTY ANTERIOR APPROACH on 04/03/2019   Consultants:   Discharged Condition: Improved  Hospital Course: Tim [name] is an 54 y.o. male who was admitted 04/03/2019 for operative treatment ofUnilateral primary osteoarthritis, right hip. Patient has severe unremitting pain that affects sleep, daily activities, and work/hobbies. After pre-op clearance the patient was taken to the operating room on 04/03/2019 and underwent  Procedure(s): RIGHT TOTAL HIP ARTHROPLASTY ANTERIOR APPROACH.    Patient was given perioperative antibiotics:  Anti-infectives (From admission, onward)   Start     Dose/Rate Route Frequency Ordered Stop   04/03/19 2000  ceFAZolin (ANCEF) IVPB 2g/100 mL premix     2 g 200 mL/hr over 30 Minutes Intravenous Every 6 hours 04/03/19 1444 04/04/19 0310   04/03/19 0600  ceFAZolin (ANCEF) 3 g in dextrose 5 % 50 mL IVPB     3 g 100 mL/hr over 30 Minutes Intravenous On call to O.R. 04/02/19 0720 04/03/19 1301       Patient was given sequential compression devices, early ambulation, and chemoprophylaxis to prevent DVT.  Patient benefited maximally from hospital stay and there were no complications.    Recent vital signs:  Patient Vitals for the past 24 hrs:  BP Temp Temp src Pulse Resp SpO2  04/04/19 0852 130/71 97.9 F (36.6 C) Oral 86 18 96 %  04/04/19 0502 123/72 97.7 F (36.5 C) Oral 73 16 97 %   04/04/19 0116 134/76 98 F (36.7 C) Oral 69 16 95 %  04/03/19 2014 126/79 97.7 F (36.5 C) Oral 81 14 95 %  04/03/19 1907 124/81 98.2 F (36.8 C) - 82 18 96 %  04/03/19 1834 (!) 144/82 99.1 F (37.3 C) - 89 16 96 %  04/03/19 1716 126/68 98.3 F (36.8 C) - 68 14 100 %  04/03/19 1651 126/74 - - 76 14 100 %  04/03/19 1645 (!) 152/88 (!) 97.5 F (36.4 C) - 67 16 97 %  04/03/19 1644 - (!) 97.5 F (36.4 C) - 68 15 100 %  04/03/19 1630 120/71 - - 63 15 97 %  04/03/19 1615 126/74 - - 66 15 99 %  04/03/19 1600 129/81 - - 66 18 97 %  04/03/19 1545 121/68 - - 63 16 97 %  04/03/19 1530 129/81 - - 73 18 95 %  04/03/19 1515 121/68 - - 73 15 96 %  04/03/19 1500 118/64 - - 64 18 100 %  04/03/19 1445 124/75 - - 65 18 100 %  04/03/19 1430 112/70 - - 81 17 99 %  04/03/19 1428 121/67 (!) 97.4 F (36.3 C) - 83 17 98 %     Recent laboratory studies:  Recent Labs    04/04/19 0413  WBC 12.4*  HGB 12.6*  HCT 38.3*  PLT 243  NA 135  K 4.3  CL 103  CO2 25  BUN 16  CREATININE 0.86  GLUCOSE 141*  CALCIUM 8.5*     Discharge Medications:   Allergies as of 04/04/2019   No Known Allergies     Medication List    TAKE these medications   acetaminophen 500 MG tablet Commonly known as: TYLENOL Take 1,500 mg by mouth in the morning, at noon, and at bedtime.   aspirin 81 MG chewable tablet Chew 1 tablet (81 mg total) by mouth 2 (two) times daily.   atorvastatin 40 MG tablet Commonly known as: Lipitor Take 1 tablet (40 mg total) by mouth daily.   diclofenac 75 MG EC tablet Commonly known as: VOLTAREN Take 75 mg by mouth 2 (two) times daily.   Fish Oil 1000 MG Caps Take 1,000 mg by mouth in the morning, at noon, and at bedtime.   lisinopril-hydrochlorothiazide 10-12.5 MG tablet Commonly known as: ZESTORETIC Take 1 tablet by mouth daily.   magnesium oxide 400 MG tablet Commonly known as: MAG-OX Take 400 mg by mouth daily.   Melatonin 10 MG Tabs Take 10 mg by mouth at  bedtime.   methocarbamol 500 MG tablet Commonly known as: ROBAXIN Take 1 tablet (500 mg total) by mouth every 6 (six) hours as needed for muscle spasms.   oxyCODONE 5 MG immediate release tablet Commonly known as: Oxy IR/ROXICODONE Take 1-2 tablets (5-10 mg total) by mouth every 4 (four) hours as needed for moderate pain (pain score 4-6).   Potassium 99 MG Tabs Take 99 mg by mouth daily.   QUEtiapine 100 MG tablet Commonly known as: SEROQUEL Take 100 mg by mouth at bedtime.            Durable Medical Equipment  (From admission, onward)         Start     Ordered   04/03/19 1720  DME Walker rolling  Once    Question Answer Comment  Walker: With 5 Inch Wheels   Patient needs a walker to treat with the following condition Status post total replacement of right hip      04/03/19 1719   04/03/19 1720  DME 3 n 1  Once     04/03/19 1719          Diagnostic Studies: DG Pelvis Portable  Result Date: 04/03/2019 CLINICAL DATA:  LEFT total hip arthroplasty EXAM: PORTABLE PELVIS 1-2 VIEWS COMPARISON:  Prior studies FINDINGS: RIGHT total hip arthroplasty changes noted. No complicating features or dislocation. No other significant change noted. IMPRESSION: RIGHT total hip arthroplasty without complicating features. Electronically Signed   By: Harmon Pier M.D.   On: 04/03/2019 15:04   DG C-Arm 1-60 Min-No Report  Result Date: 04/03/2019 Fluoroscopy was utilized by the requesting physician.  No radiographic interpretation.   DG HIP OPERATIVE UNILAT W OR W/O PELVIS RIGHT  Result Date: 04/03/2019 CLINICAL DATA:  Right total hip replacement. EXAM: OPERATIVE RIGHT HIP (WITH PELVIS IF PERFORMED) TECHNIQUE: Fluoroscopic spot image(s) were submitted for interpretation post-operatively. COMPARISON:  Preoperative radiograph 02/05/2019 FINDINGS: Three fluoroscopic spot views obtained in the operating room. Right hip arthroplasty in expected alignment. Fluoroscopy time 20 seconds, total dose  4.0538 mGy. IMPRESSION: Intraoperative fluoroscopy after right hip arthroplasty. Electronically Signed   By: Narda Rutherford M.D.   On: 04/03/2019 14:22    Disposition: Discharge disposition: 01-Home or Self Care         Follow-up Information    Kathryne Hitch, MD Follow up in 2 week(s).   Specialty: Orthopedic Surgery Contact information: [address] [place] [address] [phone]  Home[facility] Follow up.   Specialty: Home Health Services Why: agency will provide home health physical therapy. agency will call you to schedule first visit.  Contact information: [address] [place] [address] [phone]            Signed: Mcarthur Rossetti 04/04/2019, 11:58 AM

## 2019-04-04 NOTE — Progress Notes (Signed)
  Subjective: Tim [name] is a 54 y.o. male s/p right THA.  They are POD1.  Pt's pain is controlled.  Patient has not worked with PT yet.  He is looking forward to going home. He has his mother and children at home to help with recovery.   Objective: Vital signs in last 24 hours: Temp:  [97.4 F (36.3 C)-99.1 F (37.3 C)] 97.9 F (36.6 C) (03/13 0852) Pulse Rate:  [63-89] 86 (03/13 0852) Resp:  [14-18] 18 (03/13 0852) BP: (112-152)/(64-88) 130/71 (03/13 0852) SpO2:  [95 %-100 %] 96 % (03/13 0852) Weight:  [125.8 kg] 125.8 kg (03/12 1022)  Intake/Output from previous day: 03/12 0701 - 03/13 0700 In: 4917 [P.O.:1440; I.V.:3327; IV Piggyback:150] Out: 4275 [Urine:4025; Blood:250] Intake/Output this shift: Total I/O In: 360 [P.O.:360] Out: 200 [Urine:200]  Exam:  No gross blood or drainage overlying the dressing 2+ DP pulse Sensation intact distally in the right foot Able to dorsiflex and plantarflex the right foot   Labs: Recent Labs    04/04/19 0413  HGB 12.6*   Recent Labs    04/04/19 0413  WBC 12.4*  RBC 4.27  HCT 38.3*  PLT 243   Recent Labs    04/04/19 0413  NA 135  K 4.3  CL 103  CO2 25  BUN 16  CREATININE 0.86  GLUCOSE 141*  CALCIUM 8.5*   No results for input(s): LABPT, INR in the last 72 hours.  Assessment/Plan: Pt is POD1 s/p right THA.    -Plan to discharge to home today or tomorrow pending patient's pain and PT eval  -WBAT with a walker  -Okay to shower, dressing is waterproof.  Cautioned patient against soaking dressing in bath/pool/body of water     Julieanne Cotton 04/04/2019, 9:49 AM

## 2019-04-04 NOTE — Discharge Instructions (Signed)

## 2019-04-04 NOTE — Plan of Care (Signed)
  Problem: Clinical Measurements: Goal: Respiratory complications will improve Outcome: Progressing   Problem: Clinical Measurements: Goal: Cardiovascular complication will be avoided Outcome: Progressing   Problem: Coping: Goal: Level of anxiety will decrease Outcome: Progressing   Problem: Elimination: Goal: Will not experience complications related to bowel motility Outcome: Progressing   Problem: Pain Managment: Goal: General experience of comfort will improve Outcome: Progressing   Problem: Safety: Goal: Ability to remain free from injury will improve Outcome: Progressing   

## 2019-04-04 NOTE — Progress Notes (Signed)
Pt home equipment delivered to room. Pt remains stable and now awaiting ride home. No needs at this time.

## 2019-04-04 NOTE — Progress Notes (Signed)
Pt stable at time of d/c education and instructions. No s/s of distress. Pt medicated for pain and other needs per orders. Pt dressing dry and intact to hip. Pt waiting on home equipment to be discharged home. Rn will continue to monitor.

## 2019-04-04 NOTE — Progress Notes (Signed)
Physical Therapy Treatment Patient Details Name: Tim [name] MRN: [id number] DOB: Jan 05, 1966 Today's Date: 04/04/2019    History of Present Illness Patient is 54 y.o. male s/p Rt THA anterior approach on 04/03/19 with PMH significant for HTN, HLD, depression, anxiety, OA, bipolar disorder, lumbar decompression.    PT Comments    Pt is eager to d/c home on today. He is progressing well with his mobility. Reviewed and practiced exercises, gait training, and stair training. Issued HEP for pt to perform until HHPT begins. All education completed. Okay to d/c from PT standpoint. Pt is awaiting DME.   Follow Up Recommendations  Follow surgeon's recommendation for DC plan and follow-up therapies     Equipment Recommendations  Rolling walker with 5" wheels    Recommendations for Other Services       Precautions / Restrictions Precautions Precautions: Fall Restrictions Weight Bearing Restrictions: No RLE Weight Bearing: Weight bearing as tolerated    Mobility  Bed Mobility Overal bed mobility: Needs Assistance Bed Mobility: Supine to Sit     Supine to sit: Min guard;HOB elevated     General bed mobility comments: close guard for safety. pt used gait belt as a leg lifter  Transfers Overall transfer level: Needs assistance Equipment used: Rolling walker (2 wheeled) Transfers: Sit to/from Stand Sit to Stand: Min guard         General transfer comment: Close guard for safety. VCs safety, hand placement  Ambulation/Gait Ambulation/Gait assistance: Min guard Gait Distance (Feet): 115 Feet Assistive device: Rolling walker (2 wheeled) Gait Pattern/deviations: Step-to pattern;Step-through pattern;Decreased stride length;Decreased step length - right     General Gait Details: Close guard for safety. VCs safety, sequence, distance from RW. Slow gait speed.   Stairs Stairs: Yes Stairs assistance: Min guard Stair Management: Step to pattern;Forwards;With walker Number of  Stairs: 1 General stair comments: VCs safety, technique, sequence. Close guard for safety.   Wheelchair Mobility    Modified Rankin (Stroke Patients Only)       Balance Overall balance assessment: Needs assistance         Standing balance support: Bilateral upper extremity supported Standing balance-Leahy Scale: Fair                              Cognition Arousal/Alertness: Awake/alert Behavior During Therapy: WFL for tasks assessed/performed Overall Cognitive Status: Within Functional Limits for tasks assessed                                        Exercises Total Joint Exercises Ankle Circles/Pumps: AROM;Both;15 reps Quad Sets: AROM;Both;15 reps Heel Slides: AAROM;Right;15 reps Hip ABduction/ADduction: AAROM;Right;15 reps    General Comments        Pertinent Vitals/Pain Pain Assessment: 0-10 Pain Score: 7  Pain Location: R hip/thigh Pain Descriptors / Indicators: Sore;Discomfort Pain Intervention(s): Monitored during session;Ice applied;Repositioned    Home Living                      Prior Function            PT Goals (current goals can now be found in the care plan section) Progress towards PT goals: Progressing toward goals    Frequency    7X/week      PT Plan Current plan remains appropriate    Co-evaluation  AM-PAC PT "6 Clicks" Mobility   Outcome Measure  Help needed turning from your back to your side while in a flat bed without using bedrails?: A Little Help needed moving from lying on your back to sitting on the side of a flat bed without using bedrails?: A Little Help needed moving to and from a bed to a chair (including a wheelchair)?: A Little Help needed standing up from a chair using your arms (e.g., wheelchair or bedside chair)?: A Little Help needed to walk in hospital room?: A Little Help needed climbing 3-5 steps with a railing? : A Little 6 Click Score: 18    End  of Session Equipment Utilized During Treatment: Gait belt Activity Tolerance: Patient tolerated treatment well Patient left: in chair;with call bell/phone within reach   PT Visit Diagnosis: Pain;Other abnormalities of gait and mobility (R26.89) Pain - Right/Left: Right Pain - part of body: Hip     Time: 1026-1050 PT Time Calculation (min) (ACUTE ONLY): 24 min  Charges:  $Gait Training: 8-22 mins $Therapeutic Exercise: 8-22 mins                         Faye Ramsay, PT Acute Rehabilitation

## 2019-04-04 NOTE — TOC Progression Note (Signed)
Transition of Care [facility]) - Progression Note    Patient Details  Name: Tim [name] MRN: [id number] Date of Birth: [birth year]  Transition of [facility]) CM/SW Contact  Armanda Heritage, RN Phone Number: 04/04/2019, 11:22 AM  Clinical Narrative:   CM spoke with patient at bedside.  Patient [facility] up with [facility] for HHPT. [employer] to deliver rolling walker and 3in1 to bedside.     Expected Discharge Plan: Home w Home Health Services Barriers to Discharge: Continued Medical Work up  Expected Discharge Plan and Services Expected Discharge Plan: Home w Home Health Services   Discharge Planning Services: CM Consult Post Acute Care Choice: Home Health Living arrangements for the past 2 months: Single Family Home                 DME Arranged: Walker rolling, 3-N-1 DME Agency: [employer] Date DME Agency Contacted: 04/04/19 Time DME Agency Contacted: 1120 Representative spoke with at DME Agency: Ledell Noss HH Arranged: PT HH Agency: [facility] Date HH Agency Contacted: 04/04/19 Time HH Agency Contacted: 1120 Representative spoke with at [facility] Agency: Laurelyn Sickle   Social Determinants of Health (SDOH) Interventions    Readmission Risk Interventions No flowsheet data found.

## 2019-04-06 ENCOUNTER — Encounter: Payer: Self-pay | Admitting: *Deleted

## 2019-04-13 ENCOUNTER — Other Ambulatory Visit: Payer: Self-pay | Admitting: Orthopaedic Surgery

## 2019-04-13 ENCOUNTER — Telehealth: Payer: Self-pay | Admitting: Orthopaedic Surgery

## 2019-04-13 MED ORDER — OXYCODONE HCL 5 MG PO TABS: 5.0000 mg | ORAL_TABLET | ORAL | 0 refills | Status: DC | PRN

## 2019-04-13 NOTE — Telephone Encounter (Signed)
Please advise 

## 2019-04-13 NOTE — Telephone Encounter (Signed)
Patient called and requested a refill of Oxycodone  Please call patient to advise.  [phone]

## 2019-04-16 ENCOUNTER — Other Ambulatory Visit: Payer: Self-pay

## 2019-04-16 ENCOUNTER — Ambulatory Visit (INDEPENDENT_AMBULATORY_CARE_PROVIDER_SITE_OTHER): Payer: 59 | Admitting: Physician Assistant

## 2019-04-16 ENCOUNTER — Encounter: Payer: Self-pay | Admitting: Physician Assistant

## 2019-04-16 DIAGNOSIS — Z96641 Presence of right artificial hip joint: Secondary | ICD-10-CM

## 2019-04-16 NOTE — Progress Notes (Signed)
HPI: Tim [name] returns 2 weeks status post right total hip arthroplasty.  He is doing well reports he is back at work.  He has pain mostly at night right hip.  He has had no fevers chills shortness of breath.  Physical exam: General well-developed well-nourished male no acute distress. Right hip: Surgical incisions well approximated staples.  Small seroma.  No signs of infection.  Right calf supple nontender.  Good range of motion of the right hip.  Dorsiflexion plantarflexion right ankle intact.  Impression: Status post right total hip arthroplasty 04/03/2019  Plan: He will continue his full-strength aspirin once daily for another week and then discontinue.  Staples were removed Steri-Strips applied.  Aspiration of seroma was attempted today 10 cc of seroma aspirate was obtained patient tolerated.  He will follow-up with [place] in a month sooner if there is any questions concerns.

## 2019-04-23 ENCOUNTER — Other Ambulatory Visit: Payer: Self-pay | Admitting: Physician Assistant

## 2019-04-23 ENCOUNTER — Telehealth: Payer: Self-pay | Admitting: Orthopaedic Surgery

## 2019-04-23 MED ORDER — OXYCODONE HCL 5 MG PO TABS: 5.0000 mg | ORAL_TABLET | Freq: Four times a day (QID) | ORAL | 0 refills | Status: DC | PRN

## 2019-04-23 NOTE — Telephone Encounter (Signed)
I sent in

## 2019-04-23 NOTE — Telephone Encounter (Signed)
Patient called.   He needs a refill on his oxycodone.   Call back: [phone]

## 2019-04-23 NOTE — Telephone Encounter (Signed)
Please advise 

## 2019-04-27 NOTE — Telephone Encounter (Signed)
LVM that medication was sent into pharmacy.

## 2019-05-13 ENCOUNTER — Other Ambulatory Visit: Payer: Self-pay

## 2019-05-13 ENCOUNTER — Ambulatory Visit (INDEPENDENT_AMBULATORY_CARE_PROVIDER_SITE_OTHER): Payer: 59 | Admitting: Physician Assistant

## 2019-05-13 ENCOUNTER — Encounter: Payer: Self-pay | Admitting: Physician Assistant

## 2019-05-13 DIAGNOSIS — Z96641 Presence of right artificial hip joint: Secondary | ICD-10-CM

## 2019-05-13 NOTE — Progress Notes (Signed)
HPI: Mr. Jablonowski returns today follow-up 5 weeks 5 days status post right total hip arthroplasty.  He states he is doing well.  He has no complaints.  He is back at work.  He is happy with the results.  States his surgical incisions well-healed.  He denies any pain in the hip whatsoever at this point.  Physical exam: Right hip good range of motion without pain.  Right calf supple nontender.  Dorsiflexion plantarflexion right ankle intact.  Ambulates without any assistive device.  Impression: Status post right total hip arthroplasty 04/03/2019  Plan: He will get her continue to work on range of motion strengthening the hip.  He will follow-up with [place] in 5 months see how well he is doing.  He can follow-up sooner if there is any questions or concerns.  AP pelvis and lateral view of the right hip at that time.

## 2019-08-26 ENCOUNTER — Encounter: Payer: Self-pay | Admitting: Physician Assistant

## 2019-08-26 ENCOUNTER — Ambulatory Visit (INDEPENDENT_AMBULATORY_CARE_PROVIDER_SITE_OTHER): Payer: 59 | Admitting: Physician Assistant

## 2019-08-26 ENCOUNTER — Other Ambulatory Visit: Payer: Self-pay

## 2019-08-26 ENCOUNTER — Ambulatory Visit (INDEPENDENT_AMBULATORY_CARE_PROVIDER_SITE_OTHER): Payer: 59

## 2019-08-26 DIAGNOSIS — M7071 Other bursitis of hip, right hip: Secondary | ICD-10-CM | POA: Diagnosis not present

## 2019-08-26 DIAGNOSIS — Z96641 Presence of right artificial hip joint: Secondary | ICD-10-CM

## 2019-08-26 MED ORDER — METHYLPREDNISOLONE ACETATE 40 MG/ML IJ SUSP
40.0000 mg | INTRAMUSCULAR | Status: AC | PRN
  Administered 2019-08-26: 40 mg via INTRA_ARTICULAR

## 2019-08-26 MED ORDER — LIDOCAINE HCL 1 % IJ SOLN
3.0000 mL | INTRAMUSCULAR | Status: AC | PRN
  Administered 2019-08-26: 3 mL

## 2019-08-26 NOTE — Progress Notes (Signed)
Office Visit Note   Patient: Tim [name]           Date of Birth: 09/15/1965           MRN: [id number] Visit Date: 08/26/2019              Requested by: Tim Dapper, PA-C [address] [place],  [place] [address] PCP: Tim Dapper, PA-C   Assessment & Plan: Visit Diagnoses:  1. Status post total replacement of right hip   2. Other bursitis of hip, right hip     Plan: He will work on IT band stretching exercises are shown.  We will see him back in late September as scheduled.  No radiographs at that time unless clinically indicated.  Questions were encouraged and answered.  Follow-Up Instructions: Return in about 6 weeks (around 10/07/2019).   Orders:  Orders Placed This Encounter  Procedures  . Large Joint Inj  . XR Pelvis 1-2 Views   No orders of the defined types were placed in this encounter.     Procedures: Large Joint Inj on 08/26/2019 5:24 PM Indications: pain Details: 22 G 1.5 in needle, lateral approach  Arthrogram: No  Medications: 3 mL lidocaine 1 %; 40 mg methylPREDNISolone acetate 40 MG/ML Outcome: tolerated well, no immediate complications Procedure, treatment alternatives, risks and benefits explained, specific risks discussed. Consent was given by the patient. Immediately prior to procedure a time out was called to verify the correct patient, procedure, equipment, support staff and site/side marked as required. Patient was prepped and draped in the usual sterile fashion.       Clinical Data: No additional findings.   Subjective: Chief Complaint  Patient presents with  . Right Hip - Pain    HPI Tim [name] returns status post right total hip arthroplasty 04/03/2019.  He returns due to his pain lateral aspect of his right hip.  He said no new injury.  States he cannot get it of using his right leg he is down on the right knee.  He does limp occasionally with sharp pain lateral aspect of the hip.  He is taking Tylenol and  diclofenac for this.  States pain is bearable.  He denies any groin pain denies any radicular symptoms down the leg.  Review of Systems See HPI  Objective: Vital Signs: There were no vitals taken for this visit.  Physical Exam  Ortho Exam  Specialty Comments:  No specialty comments available.  Imaging: XR Pelvis 1-2 Views  Result Date: 08/26/2019 AP pelvis: Bilateral hips well located.  Moderate arthritic changes left hip.  Right hip well-seated total arthroplasty components.  No hardware failure.  No bony abnormalities.  No acute fractures.    PMFS History: Patient Active Problem List   Diagnosis Date Noted  . Unilateral primary osteoarthritis, right hip 04/03/2019  . Status post total replacement of right hip 04/03/2019  . Morbid obesity with BMI of 40.0-44.9, adult (HCC) 07/14/2017  . Mood disorder in conditions classified elsewhere 02/14/2017  . Alcohol use disorder, severe, in sustained remission (HCC) 02/14/2017  . Cocaine use disorder, severe, in sustained remission (HCC) 02/14/2017  . Chronic back pain greater than 3 months duration 01/24/2017  . Migraine headache without aura 01/24/2017  . Paronychia of left thumb 06/11/2016  . History of prior cigarette smoking 05/14/2016  . Substance abuse in remission (HCC) 05/14/2016  . Alcohol abuse, in remission 05/14/2016  . Pincer nail deformity 05/14/2016  . Class 2 obesity with  body mass index (BMI) of 39.0 to 39.9 in adult 05/14/2016  . Hypertriglyceridemia 04/25/2010  . Metabolic syndrome 04/25/2010  . HTN (hypertension) 04/25/2010  . Bipolar 1 disorder (HCC) 04/25/2010   Past Medical History:  Diagnosis Date  . Anxiety   . Arthritis   . Bipolar disorder (HCC)   . Depression   . Hyperlipidemia   . Hypertension   . Substance abuse (HCC)    recovered - alcohol 2015, drugs 2012    Family History  Problem Relation Age of Onset  . Arthritis Mother   . Cancer Father        barretts esophagus  . Depression  Father   . Diabetes Father   . Hyperlipidemia Father   . Hypertension Father   . Stroke Maternal Grandmother   . Heart disease Maternal Grandfather 78  . Dementia Paternal Grandmother   . Diabetes Paternal Grandfather   . Depression Maternal Aunt   . Alcohol abuse Maternal Aunt   . Alcohol abuse Maternal Uncle   . Drug abuse Paternal Uncle     Past Surgical History:  Procedure Laterality Date  . BACK SURGERY  2012  . EXCISION MASS UPPER EXTREMETIES Left 08/22/2017   Procedure: LEFT THUMB EXCISION MASS NAILBED WITH DEBRIDEMENT OF INTERPHALANGEAL JOINT;  Surgeon: Tim Salt, MD;  Location: [facility];  Service: Orthopedics;  Laterality: Left;  . FRACTURE SURGERY     right hand  . HAND SURGERY  1988  . SPINE SURGERY     , and lumbar decompression  . TONSILLECTOMY AND ADENOIDECTOMY    . TOTAL HIP ARTHROPLASTY Right 04/03/2019   Procedure: RIGHT TOTAL HIP ARTHROPLASTY ANTERIOR APPROACH;  Surgeon: Tim Hitch, MD;  Location: [facility];  Service: Orthopedics;  Laterality: Right;   Social History   Occupational History  . Occupation: sheet metal fabrication  Tobacco Use  . Smoking status: Former Games developer  . Smokeless tobacco: Never Used  . Tobacco comment: quit smoking 6 years ago  Vaping Use  . Vaping Use: Former  Substance and Sexual Activity  . Alcohol use: Yes    Comment: occasionally/ been through rehab  . Drug use: Yes    Types: "Crack" cocaine    Comment: relapse w/ crack 11/2010  . Sexual activity: Not Currently    Birth control/protection: None

## 2019-09-21 ENCOUNTER — Ambulatory Visit (INDEPENDENT_AMBULATORY_CARE_PROVIDER_SITE_OTHER): Payer: 59

## 2019-09-21 ENCOUNTER — Encounter: Payer: Self-pay | Admitting: Physician Assistant

## 2019-09-21 ENCOUNTER — Ambulatory Visit (INDEPENDENT_AMBULATORY_CARE_PROVIDER_SITE_OTHER): Payer: 59 | Admitting: Physician Assistant

## 2019-09-21 DIAGNOSIS — M5442 Lumbago with sciatica, left side: Secondary | ICD-10-CM

## 2019-09-21 DIAGNOSIS — G8929 Other chronic pain: Secondary | ICD-10-CM

## 2019-09-21 MED ORDER — METHYLPREDNISOLONE 4 MG PO TABS: ORAL_TABLET | ORAL | 0 refills | Status: DC

## 2019-09-21 NOTE — Progress Notes (Signed)
Office Visit Note   Patient: Tim [name]           Date of Birth: [birth year]           MRN: [id number] Visit Date: 09/21/2019              Requested by: Shawnie Dapper, PA-C [address] [place],  [place] [address] PCP: Shawnie Dapper, PA-C   Assessment & Plan: Visit Diagnoses:  1. Chronic bilateral low back pain with left-sided sciatica     Plan: Given that she is back exercises perform on exam.  Did review the exercises with him.  We will place him on a Medrol Dosepak no NSAIDs while on Medrol Dosepak.  We will see him back in 4 weeks see what type of response he had to the home exercises and Medrol Dosepak.  He continues to have radicular symptoms from a recommend MRI to rule out HNP as a source of his pain.  Follow-Up Instructions: Return in about 3 weeks (around 10/12/2019).   Orders:  Orders Placed This Encounter  Procedures  . XR Lumbar Spine 2-3 Views   Meds ordered this encounter  Medications  . methylPREDNISolone (MEDROL) 4 MG tablet    Sig: Take as directed    Dispense:  21 tablet    Refill:  0      Procedures: No procedures performed   Clinical Data: No additional findings.   Subjective: Chief Complaint  Patient presents with  . Lower Back - Pain    HPI Mr. Merlo is well-known to Dr. Magnus Ivan service comes in today due to low back pain with radicular symptoms down the left leg.  He states that the pain has been ongoing since 2012 but it is getting worse.  He has had back surgery in the past.  Does note some loss of sensation over the right lateral hip is unsure if this is due to his right total hip arthroplasty.  He states he cannot stand straight after first arising from sitting position.  He denies any bowel bladder dysfunction saddle anesthesia waking symptoms fevers or chills.  Takes Tylenol for the pain and also he is on diclofenac. Review of Systems Please see HPI otherwise negative  Objective: Vital Signs: There were no  vitals taken for this visit.  Physical Exam General: Well-developed well-nourished male in no acute distress. Psych: Alert and oriented x3. Vascular: Dorsal pedal pulses 2+ bilaterally calf supple nontender bilaterally Ortho Exam Lower extremities 5 out of 5 strength throughout against resistance except for extension of the left great toe.  He also has slight decreased sensation over the left great toe to light touch otherwise sensation intact bilateral feet.  Negative straight leg raise bilaterally.  Tight hamstrings bilaterally left greater than right.  Is able to come within an inch or so to touch his toes lumbar flexion.  The that Specialty Comments:  No specialty comments available.  Imaging: XR Lumbar Spine 2-3 Views  Result Date: 09/21/2019 Lumbar spine 2 views: Loss of lordotic curvature.  Retrospondylolisthesis grade 1 at L3-4.  Loss of disc space most notably at L3-4 L4-5 and L5-S1.  Multiple levels of anterior endplate spurring.  No acute fractures or bony abnormalities.    PMFS History: Patient Active Problem List   Diagnosis Date Noted  . Unilateral primary osteoarthritis, right hip 04/03/2019  . Status post total replacement of right hip 04/03/2019  . Morbid obesity with BMI of 40.0-44.9, adult (HCC) 07/14/2017  .  Mood disorder in conditions classified elsewhere 02/14/2017  . Alcohol use disorder, severe, in sustained remission (HCC) 02/14/2017  . Cocaine use disorder, severe, in sustained remission (HCC) 02/14/2017  . Chronic back pain greater than 3 months duration 01/24/2017  . Migraine headache without aura 01/24/2017  . Paronychia of left thumb 06/11/2016  . History of prior cigarette smoking 05/14/2016  . Substance abuse in remission (HCC) 05/14/2016  . Alcohol abuse, in remission 05/14/2016  . Pincer nail deformity 05/14/2016  . Class 2 obesity with body mass index (BMI) of 39.0 to 39.9 in adult 05/14/2016  . Hypertriglyceridemia 04/25/2010  . Metabolic  syndrome 04/25/2010  . HTN (hypertension) 04/25/2010  . Bipolar 1 disorder (HCC) 04/25/2010   Past Medical History:  Diagnosis Date  . Anxiety   . Arthritis   . Bipolar disorder (HCC)   . Depression   . Hyperlipidemia   . Hypertension   . Substance abuse (HCC)    recovered - alcohol 2015, drugs 2012    Family History  Problem Relation Age of Onset  . Arthritis Mother   . Cancer Father        barretts esophagus  . Depression Father   . Diabetes Father   . Hyperlipidemia Father   . Hypertension Father   . Stroke Maternal Grandmother   . Heart disease Maternal Grandfather 36  . Dementia Paternal Grandmother   . Diabetes Paternal Grandfather   . Depression Maternal Aunt   . Alcohol abuse Maternal Aunt   . Alcohol abuse Maternal Uncle   . Drug abuse Paternal Uncle     Past Surgical History:  Procedure Laterality Date  . BACK SURGERY  2012  . EXCISION MASS UPPER EXTREMETIES Left 08/22/2017   Procedure: LEFT THUMB EXCISION MASS NAILBED WITH DEBRIDEMENT OF INTERPHALANGEAL JOINT;  Surgeon: Cindee Salt, MD;  Location: [facility];  Service: Orthopedics;  Laterality: Left;  . FRACTURE SURGERY     right hand  . HAND SURGERY  1988  . SPINE SURGERY     , and lumbar decompression  . TONSILLECTOMY AND ADENOIDECTOMY    . TOTAL HIP ARTHROPLASTY Right 04/03/2019   Procedure: RIGHT TOTAL HIP ARTHROPLASTY ANTERIOR APPROACH;  Surgeon: Kathryne Hitch, MD;  Location: [facility];  Service: Orthopedics;  Laterality: Right;   Social History   Occupational History  . Occupation: sheet metal fabrication  Tobacco Use  . Smoking status: Former Games developer  . Smokeless tobacco: Never Used  . Tobacco comment: quit smoking 6 years ago  Vaping Use  . Vaping Use: Former  Substance and Sexual Activity  . Alcohol use: Yes    Comment: occasionally/ been through rehab  . Drug use: Yes    Types: "Crack" cocaine    Comment: relapse w/ crack 11/2010  . Sexual activity: Not Currently     Birth control/protection: None

## 2019-10-14 ENCOUNTER — Ambulatory Visit: Payer: 59 | Admitting: Physician Assistant

## 2020-02-10 ENCOUNTER — Other Ambulatory Visit: Payer: Self-pay

## 2020-02-10 ENCOUNTER — Encounter: Payer: Self-pay | Admitting: Physician Assistant

## 2020-02-10 ENCOUNTER — Ambulatory Visit (INDEPENDENT_AMBULATORY_CARE_PROVIDER_SITE_OTHER): Payer: 59 | Admitting: Physician Assistant

## 2020-02-10 DIAGNOSIS — M4807 Spinal stenosis, lumbosacral region: Secondary | ICD-10-CM

## 2020-02-10 DIAGNOSIS — M5440 Lumbago with sciatica, unspecified side: Secondary | ICD-10-CM | POA: Diagnosis not present

## 2020-02-10 DIAGNOSIS — G8929 Other chronic pain: Secondary | ICD-10-CM | POA: Diagnosis not present

## 2020-02-10 NOTE — Progress Notes (Signed)
Office Visit Note   Patient: Tim [name]           Date of Birth: [birth year]           MRN: [id number] Visit Date: 02/10/2020              Requested by: Shawnie Dapper, PA-C [address] [place],  [place] [address] PCP: Shawnie Dapper, PA-C   Assessment & Plan: Visit Diagnoses:  1. Chronic bilateral low back pain with sciatica, sciatica laterality unspecified     Plan: Due to patient's continued low back pain with radicular symptoms and given the decreased sensation and weakness involving his right great toe recommend MRI of the lumbar spine to rule out HNP as a source of his radicular symptoms.  Have him back after the MRI to go over results and discuss further treatment.  Does state that he is claustrophobic and has for an open MRI.  Follow-Up Instructions: Return for AFTER MRI.   Orders:  No orders of the defined types were placed in this encounter.  No orders of the defined types were placed in this encounter.     Procedures: No procedures performed   Clinical Data: No additional findings.   Subjective: Chief Complaint  Patient presents with  . Lower Back - Pain    HPI Tim [name] returns today due to his low back pain.  He states his pain is actually worse than it was when we saw him a month ago.  Medications gave him no relief in his pain.  He states he is having pain that is radiating down from his thighs to his knees.  Notes he still has decreased sensation over the left great toe.  He is having low back pain.  History of back surgery by Dr. Jeral Fruit some years ago.  He states that he does not want to return to see Dr. Jeral Fruit in the future. Denies any bowel or bladder dysfunction, saddle anesthesia. Review of Systems See HPI.  Objective: Vital Signs: There were no vitals taken for this visit.  Physical Exam Constitutional:      Appearance: He is not ill-appearing or diaphoretic.  Cardiovascular:     Pulses: Normal pulses.  Pulmonary:      Effort: Pulmonary effort is normal.  Neurological:     Mental Status: He is alert and oriented to person, place, and time.  Psychiatric:        Behavior: Behavior normal.     Ortho Exam Positive straight leg raise bilaterally.  Weakness with extension of his left great toe against resistance.  Subjective decreased sensation over the left great toe superficial peroneal nerve region.  5-5 strength throughout the lower extremities otherwise against resistance bilaterally. Specialty Comments:  No specialty comments available.  Imaging: No results found.   PMFS History: Patient Active Problem List   Diagnosis Date Noted  . Unilateral primary osteoarthritis, right hip 04/03/2019  . Status post total replacement of right hip 04/03/2019  . Morbid obesity with BMI of 40.0-44.9, adult (HCC) 07/14/2017  . Mood disorder in conditions classified elsewhere 02/14/2017  . Alcohol use disorder, severe, in sustained remission (HCC) 02/14/2017  . Cocaine use disorder, severe, in sustained remission (HCC) 02/14/2017  . Chronic back pain greater than 3 months duration 01/24/2017  . Migraine headache without aura 01/24/2017  . Paronychia of left thumb 06/11/2016  . History of prior cigarette smoking 05/14/2016  . Substance abuse in remission (HCC) 05/14/2016  . Alcohol abuse,  in remission 05/14/2016  . Pincer nail deformity 05/14/2016  . Class 2 obesity with body mass index (BMI) of 39.0 to 39.9 in adult 05/14/2016  . Hypertriglyceridemia 04/25/2010  . Metabolic syndrome 04/25/2010  . HTN (hypertension) 04/25/2010  . Bipolar 1 disorder (HCC) 04/25/2010   Past Medical History:  Diagnosis Date  . Anxiety   . Arthritis   . Bipolar disorder (HCC)   . Depression   . Hyperlipidemia   . Hypertension   . Substance abuse (HCC)    recovered - alcohol 2015, drugs 2012    Family History  Problem Relation Age of Onset  . Arthritis Mother   . Cancer Father        barretts esophagus  .  Depression Father   . Diabetes Father   . Hyperlipidemia Father   . Hypertension Father   . Stroke Maternal Grandmother   . Heart disease Maternal Grandfather 18  . Dementia Paternal Grandmother   . Diabetes Paternal Grandfather   . Depression Maternal Aunt   . Alcohol abuse Maternal Aunt   . Alcohol abuse Maternal Uncle   . Drug abuse Paternal Uncle     Past Surgical History:  Procedure Laterality Date  . BACK SURGERY  2012  . EXCISION MASS UPPER EXTREMETIES Left 08/22/2017   Procedure: LEFT THUMB EXCISION MASS NAILBED WITH DEBRIDEMENT OF INTERPHALANGEAL JOINT;  Surgeon: Cindee Salt, MD;  Location: [facility];  Service: Orthopedics;  Laterality: Left;  . FRACTURE SURGERY     right hand  . HAND SURGERY  1988  . SPINE SURGERY     , and lumbar decompression  . TONSILLECTOMY AND ADENOIDECTOMY    . TOTAL HIP ARTHROPLASTY Right 04/03/2019   Procedure: RIGHT TOTAL HIP ARTHROPLASTY ANTERIOR APPROACH;  Surgeon: Kathryne Hitch, MD;  Location: [facility];  Service: Orthopedics;  Laterality: Right;   Social History   Occupational History  . Occupation: sheet metal fabrication  Tobacco Use  . Smoking status: Former Games developer  . Smokeless tobacco: Never Used  . Tobacco comment: quit smoking 6 years ago  Vaping Use  . Vaping Use: Former  Substance and Sexual Activity  . Alcohol use: Yes    Comment: occasionally/ been through rehab  . Drug use: Yes    Types: "Crack" cocaine    Comment: relapse w/ crack 11/2010  . Sexual activity: Not Currently    Birth control/protection: None

## 2020-02-27 ENCOUNTER — Ambulatory Visit
Admission: RE | Admit: 2020-02-27 | Discharge: 2020-02-27 | Disposition: A | Discharge status: Home / Self Care | Payer: 59 | Source: Ambulatory Visit | Attending: Orthopaedic Surgery | Admitting: Orthopaedic Surgery

## 2020-02-27 ENCOUNTER — Other Ambulatory Visit: Payer: Self-pay

## 2020-02-27 DIAGNOSIS — M4807 Spinal stenosis, lumbosacral region: Secondary | ICD-10-CM

## 2020-03-01 ENCOUNTER — Other Ambulatory Visit: Payer: Self-pay

## 2020-03-01 ENCOUNTER — Encounter: Payer: Self-pay | Admitting: Orthopaedic Surgery

## 2020-03-01 ENCOUNTER — Ambulatory Visit (INDEPENDENT_AMBULATORY_CARE_PROVIDER_SITE_OTHER): Payer: 59 | Admitting: Orthopaedic Surgery

## 2020-03-01 DIAGNOSIS — G8929 Other chronic pain: Secondary | ICD-10-CM | POA: Diagnosis not present

## 2020-03-01 DIAGNOSIS — M5442 Lumbago with sciatica, left side: Secondary | ICD-10-CM

## 2020-03-01 DIAGNOSIS — M4807 Spinal stenosis, lumbosacral region: Secondary | ICD-10-CM | POA: Diagnosis not present

## 2020-03-01 NOTE — Progress Notes (Signed)
The patient comes in today to go over the MRI of his lumbar spine.  He is been having significant pain going down his left leg with numbness and tingling in his left foot.  He does have some thigh pain on the right side.  He has remote history of surgery on his lumbar spine with no instrumentation years ago by Dr. Jeral Fruit who is since retired.  We sent her for an MRI at this point given the failed conservative treatment.  He still has the same symptoms on my exam some slight weakness in his dorsiflexion of his left great toe and foot with numbness and tingling as well.  He has a positive H left side but there are some radicular symptoms to the right side.  MRI does show significant stenosis at L3-4, L4-5 and L5-S1 that is multifactorial.  The foraminal stenosis is quite severe at L5-S1 but again the levels above are also worrisome for significant narrowing and stenosis.  At this point I would like to refer him to neurosurgery for an evaluation of whether or not they feel that a surgical intervention with fusion is more reasonable versus continue conservative treatment with injections.  He agrees with this referral.  All question concerns were answered and addressed.

## 2020-05-20 ENCOUNTER — Other Ambulatory Visit: Payer: Self-pay | Admitting: Neurological Surgery

## 2020-05-20 DIAGNOSIS — M5416 Radiculopathy, lumbar region: Secondary | ICD-10-CM

## 2020-06-02 ENCOUNTER — Other Ambulatory Visit: Payer: Self-pay

## 2020-06-02 ENCOUNTER — Ambulatory Visit
Admission: RE | Admit: 2020-06-02 | Discharge: 2020-06-02 | Disposition: A | Discharge status: Home / Self Care | Payer: 59 | Source: Ambulatory Visit | Attending: Neurological Surgery | Admitting: Neurological Surgery

## 2020-06-02 DIAGNOSIS — M5416 Radiculopathy, lumbar region: Secondary | ICD-10-CM

## 2020-06-02 MED ORDER — METHYLPREDNISOLONE ACETATE 40 MG/ML INJ SUSP (RADIOLOG
80.0000 mg | Freq: Once | INTRAMUSCULAR | Status: AC
  Administered 2020-06-02: 80 mg via EPIDURAL

## 2020-06-02 MED ORDER — IOPAMIDOL (ISOVUE-M 200) INJECTION 41%
1.0000 mL | Freq: Once | INTRAMUSCULAR | Status: AC
  Administered 2020-06-02: 1 mL via EPIDURAL

## 2020-06-02 NOTE — Discharge Instructions (Signed)

## 2020-07-12 ENCOUNTER — Other Ambulatory Visit: Payer: Self-pay | Admitting: Neurological Surgery

## 2020-08-04 NOTE — Progress Notes (Signed)
Surgical Instructions    Your procedure is scheduled on Tuesday, 08/09/20.  Report to [facility] Main Entrance "A" at 5:30 A.M., then check in with the Admitting office.  Call this number if you have problems the morning of surgery:  [phone]   If you have any questions prior to your surgery date call [phone]: Open Monday-Friday 8am-4pm    Remember:  Do not eat after midnight the night before your surgery  You may drink clear liquids until 4:30 AM the morning of your surgery.   Clear liquids allowed are: Water, Non-Citrus Juices (without pulp), Carbonated Beverages, Clear Tea, Black Coffee Only, and Gatorade    Take these medicines the morning of surgery with A SIP OF WATER   Tylenol - if needed  Atorvastatin (Lipitor)     As of today, STOP taking any Aspirin (unless otherwise instructed by your surgeon) Aleve, Naproxen, Ibuprofen, Motrin, Advil, Goody's, BC's, all herbal medications, fish oil, and all vitamins.          Do not wear jewelry  Do not wear lotions, powders, colognes, or deodorant. Men may shave face and neck. Do not bring valuables to the hospital.             [facility] is not responsible for any belongings or valuables.  Do NOT Smoke (Tobacco/Vaping) or drink Alcohol 24 hours prior to your procedure If you use a CPAP at night, you may bring all equipment for your overnight stay.   Contacts, glasses, dentures or bridgework may not be worn into surgery, please bring cases for these belongings   For patients admitted to the hospital, discharge time will be determined by your treatment team.   Patients discharged the day of surgery will not be allowed to drive home, and someone needs to stay with them for 24 hours.  ONLY 1 SUPPORT PERSON MAY BE PRESENT WHILE YOU ARE IN SURGERY. IF YOU ARE TO BE ADMITTED ONCE YOU ARE IN YOUR ROOM YOU WILL BE ALLOWED TWO (2) VISITORS.  Minor children may have two parents present. Special consideration for safety and  communication needs will be reviewed on a case by case basis.  Special instructions:    Oral Hygiene is also important to reduce your risk of infection.  Remember - BRUSH YOUR TEETH THE MORNING OF SURGERY WITH YOUR REGULAR TOOTHPASTE   [facility]- Preparing For Surgery  Before surgery, you can play an important role. Because skin is not sterile, your skin needs to be as free of germs as possible. You can reduce the number of germs on your skin by washing with CHG (chlorahexidine gluconate) Soap before surgery.  CHG is an antiseptic cleaner which kills germs and bonds with the skin to continue killing germs even after washing.     Please do not use if you have an allergy to CHG or antibacterial soaps. If your skin becomes reddened/irritated stop using the CHG.  Do not shave (including legs and underarms) for at least 48 hours prior to first CHG shower. It is OK to shave your face.  Please follow these instructions carefully.     Shower the NIGHT BEFORE SURGERY and the MORNING OF SURGERY with CHG Soap.   If you chose to wash your hair, wash your hair first as usual with your normal shampoo. After you shampoo, rinse your hair and body thoroughly to remove the shampoo.  Then [employer] and genitals (private parts) with your normal soap and rinse thoroughly to remove soap.  After that Use CHG Soap as you would any other liquid soap. You can apply CHG directly to the skin and wash gently with a scrungie or a clean washcloth.   Apply the CHG Soap to your body ONLY FROM THE NECK DOWN.  Do not use on open wounds or open sores. Avoid contact with your eyes, ears, mouth and genitals (private parts). Wash Face and genitals (private parts)  with your normal soap.   Wash thoroughly, paying special attention to the area where your surgery will be performed.  Thoroughly rinse your body with warm water from the neck down.  DO NOT shower/wash with your normal soap after using and rinsing off the CHG  Soap.  Pat yourself dry with a CLEAN TOWEL.  Wear CLEAN PAJAMAS to bed the night before surgery  Place CLEAN SHEETS on your bed the night before your surgery  DO NOT SLEEP WITH PETS.   Day of Surgery:  Take a shower with CHG soap. Wear Clean/Comfortable clothing the morning of surgery Do not apply any deodorants/lotions.   Remember to brush your teeth WITH YOUR REGULAR TOOTHPASTE.   Please read over the following fact sheets that you were given.

## 2020-08-05 ENCOUNTER — Encounter (HOSPITAL_COMMUNITY)
Admission: RE | Admit: 2020-08-05 | Discharge: 2020-08-05 | Disposition: A | Discharge status: Home / Self Care | Payer: 59 | Source: Ambulatory Visit | Attending: Neurological Surgery | Admitting: Neurological Surgery

## 2020-08-05 ENCOUNTER — Encounter (HOSPITAL_COMMUNITY): Payer: Self-pay

## 2020-08-05 ENCOUNTER — Other Ambulatory Visit: Payer: Self-pay

## 2020-08-05 DIAGNOSIS — Z20822 Contact with and (suspected) exposure to covid-19: Secondary | ICD-10-CM | POA: Diagnosis not present

## 2020-08-05 DIAGNOSIS — Z01818 Encounter for other preprocedural examination: Secondary | ICD-10-CM | POA: Insufficient documentation

## 2020-08-05 LAB — TYPE AND SCREEN
ABO/RH(D): B POS
Antibody Screen: NEGATIVE

## 2020-08-05 LAB — BASIC METABOLIC PANEL
Anion gap: 5 (ref 5–15)
BUN: 34 mg/dL — ABNORMAL HIGH (ref 6–20)
CO2: 24 mmol/L (ref 22–32)
Calcium: 9.3 mg/dL (ref 8.9–10.3)
Chloride: 106 mmol/L (ref 98–111)
Creatinine, Ser: 0.8 mg/dL (ref 0.61–1.24)
GFR, Estimated: >60 mL/min (ref >60)
Glucose, Bld: 103 mg/dL — ABNORMAL HIGH (ref 70–99)
Potassium: 4.4 mmol/L (ref 3.5–5.1)
Sodium: 135 mmol/L (ref 135–145)

## 2020-08-05 LAB — CBC
HCT: 39.4 % (ref 39.0–52.0)
Hemoglobin: 13 g/dL (ref 13.0–17.0)
MCH: 29.7 pg (ref 26.0–34.0)
MCHC: 33 g/dL (ref 30.0–36.0)
MCV: 90.2 fL (ref 80.0–100.0)
Platelets: 290 10*3/uL (ref 150–400)
RBC: 4.37 MIL/uL (ref 4.22–5.81)
RDW: 12 % (ref 11.5–15.5)
WBC: 10.4 10*3/uL (ref 4.0–10.5)
nRBC: 0 % (ref 0.0–0.2)

## 2020-08-05 LAB — SARS CORONAVIRUS 2 (TAT 6-24 HRS): SARS Coronavirus 2: NEGATIVE

## 2020-08-05 LAB — SURGICAL PCR SCREEN
MRSA, PCR: NEGATIVE
Staphylococcus aureus: POSITIVE — AB

## 2020-08-05 NOTE — Progress Notes (Signed)
PCP - Lenise Herald, MD Cardiologist - pt denies  PPM/ICD - n/a Device Orders -  Rep Notified -   Chest x-ray - n/a EKG - 08/05/20 Stress Test - n/a ECHO - n/a Cardiac Cath - n/a  Sleep Study - n/a, positive STOP BANG routed to PCP CPAP - n/a  Fasting Blood Sugar - n/a Checks Blood Sugar _____ times a day  Blood Thinner Instructions: n/a Aspirin Instructions: n/a  Pt instructed to hold Voltaren and fish oil  ERAS Protcol - clears until 0430 DOS PRE-SURGERY Ensure or G2- n/a  COVID TEST- 08/05/20   Anesthesia review: not at this time  Patient denies shortness of breath, fever, cough and chest pain at PAT appointment   All instructions explained to the patient, with a verbal understanding of the material. Patient agrees to go over the instructions while at home for a better understanding. Patient also instructed to self quarantine after being tested for COVID-19. The opportunity to ask questions was provided.

## 2020-08-05 NOTE — Progress Notes (Signed)
   08/05/20 1317  OBSTRUCTIVE SLEEP APNEA  Have you ever been diagnosed with sleep apnea through a sleep study? No  Do you snore loudly (loud enough to be heard through closed doors)?  1  Do you often feel tired, fatigued, or sleepy during the daytime (such as falling asleep during driving or talking to someone)? 0  Has anyone observed you stop breathing during your sleep? 0  Do you have, or are you being treated for high blood pressure? 1  BMI more than 35 kg/m2? 0  Age > 50 (1-yes) 1  Neck circumference greater than:Male 16 inches or larger, Male 17inches or larger? 1 (neck circumference 18inches)  Male Gender (Yes=1) 1  Obstructive Sleep Apnea Score 5

## 2020-08-09 ENCOUNTER — Inpatient Hospital Stay: Admission: RE | Admit: 2020-08-09 | Payer: 59 | Source: Home / Self Care | Admitting: Neurological Surgery

## 2020-08-09 ENCOUNTER — Encounter: Admission: RE | Payer: Self-pay | Source: Home / Self Care

## 2020-08-09 SURGERY — TRANSFORAMINAL LUMBAR INTERBODY FUSION (TLIF) WITH PEDICLE SCREW FIXATION 3 LEVEL
Anesthesia: General | Laterality: Right

## 2020-09-12 ENCOUNTER — Other Ambulatory Visit: Payer: Self-pay | Admitting: Neurological Surgery

## 2020-09-21 NOTE — Pre-Procedure Instructions (Signed)
Surgical Instructions    Your procedure is scheduled on Monday, September 12th, 2022.  Report to [facility] Main Entrance "A" at 08:30 A.M., then check in with the Admitting office.  Call this number if you have problems the morning of surgery:  [phone]   If you have any questions prior to your surgery date call [phone]: Open Monday-Friday 8am-4pm    Remember:  Do not eat after midnight the night before your surgery  You may drink clear liquids until 07:30 A.M. the morning of your surgery.   Clear liquids allowed are: Water, Non-Citrus Juices (without pulp), Carbonated Beverages, Clear Tea, Black Coffee ONLY (NO MILK, CREAM OR POWDERED CREAMER of any kind), and Gatorade    Take these medicines the morning of surgery with A SIP OF WATER: acetaminophen (TYLENOL)     As of today, STOP taking any diclofenac (VOLTAREN), Omega-3 Fatty Acids (FISH OIL), Aspirin (unless otherwise instructed by your surgeon) Aleve, Naproxen, Ibuprofen, Motrin, Advil, Goody's, BC's, all herbal medications, and all vitamins.          Do not wear jewelry or makeup Do not wear lotions, powders, perfumes/colognes, or deodorant. Do not shave 48 hours prior to surgery.  Men may shave face and neck. Do not bring valuables to the hospital. DO Not wear nail polish, gel polish, artificial nails, or any other type of covering on  natural nails including finger and toenails. If patients have artificial nails, gel coating, etc. that need to be removed by a nail salon please have this removed prior to surgery or surgery may need to be canceled/delayed if the surgeon/ anesthesia feels like the patient is unable to be adequately monitored.             [facility] is not responsible for any belongings or valuables.  Do NOT Smoke (Tobacco/Vaping) or drink Alcohol 24 hours prior to your procedure If you use a CPAP at night, you may bring all equipment for your overnight stay.   Contacts, glasses, dentures or  bridgework may not be worn into surgery, please bring cases for these belongings   For patients admitted to the hospital, discharge time will be determined by your treatment team.   Patients discharged the day of surgery will not be allowed to drive home, and someone needs to stay with them for 24 hours.  ONLY 1 SUPPORT PERSON MAY BE PRESENT WHILE YOU ARE IN SURGERY. IF YOU ARE TO BE ADMITTED ONCE YOU ARE IN YOUR ROOM YOU WILL BE ALLOWED TWO (2) VISITORS.  Minor children may have two parents present. Special consideration for safety and communication needs will be reviewed on a case by case basis.  Special instructions:    Oral Hygiene is also important to reduce your risk of infection.  Remember - BRUSH YOUR TEETH THE MORNING OF SURGERY WITH YOUR REGULAR TOOTHPASTE   [place]- Preparing For Surgery  Before surgery, you can play an important role. Because skin is not sterile, your skin needs to be as free of germs as possible. You can reduce the number of germs on your skin by washing with CHG (chlorahexidine gluconate) Soap before surgery.  CHG is an antiseptic cleaner which kills germs and bonds with the skin to continue killing germs even after washing.     Please do not use if you have an allergy to CHG or antibacterial soaps. If your skin becomes reddened/irritated stop using the CHG.  Do not shave (including legs and underarms) for at least 48  hours prior to first CHG shower. It is OK to shave your face.  Please follow these instructions carefully.     Shower the NIGHT BEFORE SURGERY and the MORNING OF SURGERY with CHG Soap.   If you chose to wash your hair, wash your hair first as usual with your normal shampoo. After you shampoo, rinse your hair and body thoroughly to remove the shampoo.  Then [employer] and genitals (private parts) with your normal soap and rinse thoroughly to remove soap.  After that Use CHG Soap as you would any other liquid soap. You can apply CHG directly to  the skin and wash gently with a scrungie or a clean washcloth.   Apply the CHG Soap to your body ONLY FROM THE NECK DOWN.  Do not use on open wounds or open sores. Avoid contact with your eyes, ears, mouth and genitals (private parts). Wash Face and genitals (private parts)  with your normal soap.   Wash thoroughly, paying special attention to the area where your surgery will be performed.  Thoroughly rinse your body with warm water from the neck down.  DO NOT shower/wash with your normal soap after using and rinsing off the CHG Soap.  Pat yourself dry with a CLEAN TOWEL.  Wear CLEAN PAJAMAS to bed the night before surgery  Place CLEAN SHEETS on your bed the night before your surgery  DO NOT SLEEP WITH PETS.   Day of Surgery:  Take a shower with CHG soap. Wear Clean/Comfortable clothing the morning of surgery Do not apply any deodorants/lotions.   Remember to brush your teeth WITH YOUR REGULAR TOOTHPASTE.   Please read over the following fact sheets that you were given.

## 2020-09-22 ENCOUNTER — Other Ambulatory Visit: Payer: Self-pay

## 2020-09-22 ENCOUNTER — Encounter (HOSPITAL_COMMUNITY): Payer: Self-pay

## 2020-09-22 ENCOUNTER — Encounter (HOSPITAL_COMMUNITY)
Admission: RE | Admit: 2020-09-22 | Discharge: 2020-09-22 | Disposition: A | Discharge status: Home / Self Care | Payer: 59 | Source: Ambulatory Visit | Attending: Neurological Surgery | Admitting: Neurological Surgery

## 2020-09-22 DIAGNOSIS — Z01812 Encounter for preprocedural laboratory examination: Secondary | ICD-10-CM | POA: Diagnosis present

## 2020-09-22 DIAGNOSIS — M48062 Spinal stenosis, lumbar region with neurogenic claudication: Secondary | ICD-10-CM | POA: Insufficient documentation

## 2020-09-22 DIAGNOSIS — Z87891 Personal history of nicotine dependence: Secondary | ICD-10-CM | POA: Insufficient documentation

## 2020-09-22 DIAGNOSIS — I10 Essential (primary) hypertension: Secondary | ICD-10-CM | POA: Insufficient documentation

## 2020-09-22 DIAGNOSIS — Z79899 Other long term (current) drug therapy: Secondary | ICD-10-CM | POA: Diagnosis not present

## 2020-09-22 DIAGNOSIS — Z7901 Long term (current) use of anticoagulants: Secondary | ICD-10-CM | POA: Diagnosis not present

## 2020-09-22 DIAGNOSIS — Z6832 Body mass index (BMI) 32.0-32.9, adult: Secondary | ICD-10-CM | POA: Insufficient documentation

## 2020-09-22 DIAGNOSIS — E785 Hyperlipidemia, unspecified: Secondary | ICD-10-CM | POA: Insufficient documentation

## 2020-09-22 DIAGNOSIS — E669 Obesity, unspecified: Secondary | ICD-10-CM | POA: Diagnosis not present

## 2020-09-22 DIAGNOSIS — G5601 Carpal tunnel syndrome, right upper limb: Secondary | ICD-10-CM | POA: Insufficient documentation

## 2020-09-22 LAB — CBC
HCT: 40.8 % (ref 39.0–52.0)
Hemoglobin: 13.1 g/dL (ref 13.0–17.0)
MCH: 29.2 pg (ref 26.0–34.0)
MCHC: 32.1 g/dL (ref 30.0–36.0)
MCV: 91.1 fL (ref 80.0–100.0)
Platelets: 214 10*3/uL (ref 150–400)
RBC: 4.48 MIL/uL (ref 4.22–5.81)
RDW: 12.2 % (ref 11.5–15.5)
WBC: 9.1 10*3/uL (ref 4.0–10.5)
nRBC: 0 % (ref 0.0–0.2)

## 2020-09-22 LAB — COMPREHENSIVE METABOLIC PANEL
ALT: 90 U/L — ABNORMAL HIGH (ref 0–44)
AST: 42 U/L — ABNORMAL HIGH (ref 15–41)
Albumin: 3.9 g/dL (ref 3.5–5.0)
Alkaline Phosphatase: 65 U/L (ref 38–126)
Anion gap: 6 (ref 5–15)
BUN: 27 mg/dL — ABNORMAL HIGH (ref 6–20)
CO2: 26 mmol/L (ref 22–32)
Calcium: 8.8 mg/dL — ABNORMAL LOW (ref 8.9–10.3)
Chloride: 102 mmol/L (ref 98–111)
Creatinine, Ser: 0.83 mg/dL (ref 0.61–1.24)
GFR, Estimated: >60 mL/min (ref >60)
Glucose, Bld: 107 mg/dL — ABNORMAL HIGH (ref 70–99)
Potassium: 4.4 mmol/L (ref 3.5–5.1)
Sodium: 134 mmol/L — ABNORMAL LOW (ref 135–145)
Total Bilirubin: 1 mg/dL (ref 0.3–1.2)
Total Protein: 6.1 g/dL — ABNORMAL LOW (ref 6.5–8.1)

## 2020-09-22 LAB — TYPE AND SCREEN
ABO/RH(D): B POS
Antibody Screen: NEGATIVE

## 2020-09-22 LAB — SURGICAL PCR SCREEN
MRSA, PCR: NEGATIVE
Staphylococcus aureus: POSITIVE — AB

## 2020-09-22 NOTE — Progress Notes (Addendum)
PCP - Lenise Herald PA Cardiologist - Denies  Chest x-ray - Not indicated EKG - 08/05/20 Stress Test - Denies ECHO - Denies Cardiac Cath - Denies  Sleep Study - Denies OSa  DM - Denies  ERAS Protcol -Yes  PRE-SURGERY    COVID TEST- Friday September 9th   Anesthesia review: Yes ALT elevated at 90  Patient denies shortness of breath, fever, cough and chest pain at PAT appointment   All instructions explained to the patient, with a verbal understanding of the material. Patient agrees to go over the instructions while at home for a better understanding. Patient also instructed to wear a mask while in public after being tested for COVID-19. The opportunity to ask questions was provided.

## 2020-09-23 NOTE — Anesthesia Preprocedure Evaluation (Addendum)
Anesthesia Evaluation  Patient identified by MRN, date of birth, ID band Patient awake    Reviewed: Allergy & Precautions, H&P , NPO status , Patient's Chart, lab work & pertinent test results, reviewed documented beta blocker date and time   Airway Mallampati: II  TM Distance: >3 FB Neck ROM: full    Dental no notable dental hx. (+) Poor Dentition, Chipped, Missing Very poor dentition,  Teeth decay secondary to acid reflux  :   Pulmonary neg pulmonary ROS, former smoker,    Pulmonary exam normal breath sounds clear to auscultation       Cardiovascular Exercise Tolerance: Good hypertension, Pt. on medications  Rhythm:regular Rate:Normal     Neuro/Psych  Headaches, PSYCHIATRIC DISORDERS Anxiety Depression Bipolar Disorder    GI/Hepatic negative GI ROS, (+)     substance abuse  alcohol use and cocaine use, Alcohol use disorder, severe, in sustained remission  Cocaine use disorder, severe, in sustained remission      Endo/Other  Morbid obesity  Renal/GU negative Renal ROS  negative genitourinary   Musculoskeletal  (+) Arthritis , Osteoarthritis,    Abdominal   Peds  Hematology negative hematology ROS (+)   Anesthesia Other Findings   Reproductive/Obstetrics negative OB ROS                           Anesthesia Physical Anesthesia Plan  ASA: 3  Anesthesia Plan: General   Post-op Pain Management:    Induction: Intravenous  PONV Risk Score and Plan: 2 and Ondansetron and Dexamethasone  Airway Management Planned: Oral ETT  Additional Equipment: None  Intra-op Plan:   Post-operative Plan:   Informed Consent: I have reviewed the patients History and Physical, chart, labs and discussed the procedure including the risks, benefits and alternatives for the proposed anesthesia with the patient or authorized representative who has indicated his/her understanding and acceptance.      Dental Advisory Given  Plan Discussed with: CRNA and Anesthesiologist  Anesthesia Plan Comments: (PAT note written by Shonna Chock, PA-C. History includes former smoker (quit 04/23/11), HTN, HLD, Bipolar disorder, polysubstance abuse (recovered from cocaine 2013, alcohol 2015), spinal surgery (left L4-5 diskectomy, L5-S1 foraminotomy/laminotomy 05/27/10), THA (right 04/03/19). BMI is consistent with obesity.   Preoperative labs on 09/22/20 showed a normal CBC, glucose 107, Cr 0.83, AST 42 (15-41), ALT 90 (0-44), total bilirubin 1.0.  Prior substance abuse (cocaine/alcohol). Meds include Lipitor 40 mg daily, diclofenac 75 mg BID, acetaminophen 1500 mg TID, Seroquel 50 mg Q HS. Currently last comparison LFTs were normal in 2018 and previously elevated in 12/2010. I reviewed with Val Eagle, MD. With isolated elevated ALT (~ 2x normal) and normal PLT, would not anticipate need to repeat HFP preoperatively, but did advise follow-up with patient to confirm medication and any ETOH use. I called and spoke with Mr. Mau. He confirmed that he continues to be sober from alcohol and illicit drugs. In fact, he says the reason why he is on a higher dose of acetaminophen (for two years) is because he is a recovering drug addict and not taking opioids. His last dose of diclofenac was 09/22/20. He denied N/V/D, abdominal pain, yellowing of the skin. He says he had a physical with Shawnie Dapper, PA-C last month and was told his labs were good. He says he started following a Keto diet in order to lose weight before his THA. He continues to follow a Keta diet and has lost 80 lbs in  the past 18 months.  Discussed that I would request most recent labs from Mr. Loreta Ave and send patient's labs for Mr. Loreta Ave to review for follow-up purposes.    Preoperative COVID-19 testing is scheduled for 09/30/20.  ADDENDUM 09/27/20 10:22 AM: Jeanene Erb and spoke with staff at [facility] regarding AST 42, ALT 90. As  requested, will fax lab report [phone]) to PCP Lenise Herald for his review. Based on known information, anesthesiologist did not anticipate need for further evaluation prior to surgery, but forwarding for primary care follow-up purposes as indicated. My contact information provided if any additional input or questions. (UPDATE 09/30/20 9:23 AM: I never received last office note and labs from PCP office. I called [facility] this morning and was told their computer system was down and is not anticipated to be up and running until 10/03/20. Surgery is on 10/03/20, so if additional records needed staff would need to contact their office at [phone] on the day of surgery.) )      Anesthesia Quick Evaluation

## 2020-09-23 NOTE — Progress Notes (Addendum)
Anesthesia Chart Review:  Case: [id number] Date/Time: 10/03/20 1015   Procedures:      Lumbar 3-4, Lumbar 4-5, Lumbar 5-Sacral 1 open decompression with transforaminal lumbar interbody fusion, posterolateral instrumented fusion - 3C/RM 18     RIGHT CARPAL TUNNEL RELEASE (Right)   Anesthesia type: General   Pre-op diagnosis: Lumbar stenosis with neurogenic claudication; Carpal tunnel syndrome   [facility] ROOM 18 / [facility] OR   Surgeons: Jadene Pierini, MD       DISCUSSION: Patient is a 55 year old male scheduled for the above procedure.  History includes former smoker (quit 04/23/11), HTN, HLD, Bipolar disorder, polysubstance abuse (recovered from cocaine 2013, alcohol 2015), spinal surgery (left L4-5 diskectomy, L5-S1 foraminotomy/laminotomy 05/27/10), THA (right 04/03/19). BMI is consistent with obesity.   Preoperative labs on 09/22/20 showed a normal CBC, glucose 107, Cr 0.83, AST 42 (15-41), ALT 90 (0-44), total bilirubin 1.0.  Prior substance abuse (cocaine/alcohol). Meds include Lipitor 40 mg daily, diclofenac 75 mg BID, acetaminophen 1500 mg TID, Seroquel 50 mg Q HS. Currently last comparison LFTs were normal in 2018 and previously elevated in 12/2010. I reviewed with Val Eagle, MD. With isolated elevated ALT (~ 2x normal) and normal PLT, would not anticipate need to repeat HFP preoperatively, but did advise follow-up with patient to confirm medication and any ETOH use. I called and spoke with Mr. Dahms. He confirmed that he continues to be sober from alcohol and illicit drugs. In fact, he says the reason why he is on a higher dose of acetaminophen (for two years) is because he is a recovering drug addict and not taking opioids. His last dose of diclofenac was 09/22/20. He denied N/V/D, abdominal pain, yellowing of the skin. He says he had a physical with Shawnie Dapper, PA-C last month and was told his labs were good. He says he started following a Keto diet in order to lose weight  before his THA. He continues to follow a Keta diet and has lost 80 lbs in the past 18 months.  Discussed that I would request most recent labs from Mr. Loreta Ave and send patient's labs for Mr. Loreta Ave to review for follow-up purposes.    Preoperative COVID-19 testing is scheduled for 09/30/20.  ADDENDUM 09/27/20 10:22 AM: Jeanene Erb and spoke with staff at [facility] regarding AST 42, ALT 90. As requested, will fax lab report [phone]) to PCP Lenise Herald for his review. Based on known information, anesthesiologist did not anticipate need for further evaluation prior to surgery, but forwarding for primary care follow-up purposes as indicated. My contact information provided if any additional input or questions. (UPDATE 09/30/20 9:23 AM: I never received last office note and labs from PCP office. I called [facility] this morning and was told their computer system was down and is not anticipated to be up and running until 10/03/20. Surgery is on 10/03/20, so if additional records needed staff would need to contact their office at [phone] on the day of surgery.)     VS: BP 137/78   Pulse 83   Temp 36.9 C (Oral)   Resp 18   Ht 5\' 10"  (1.778 m)   Wt 103.5 kg   SpO2 99%   BMI 32.73 kg/m    PROVIDERS: , PA-C is PCP [facility])   LABS: Preoperative labs noted. See DISCUSSION. 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 1. At L3-L4 where there is moderate to severe right and moderate left foraminal stenosis, moderate to severe central canal stenosis, and severe right greater than left subarticular recess stenosis. 2. At L4-L5 there is moderate to severe right and moderate left foraminal stenosis, severe right subarticular recess stenosis, and mild to moderate central canal stenosis. 3. At L5-S1 there is moderate to severe left and mild-to-moderate right foraminal stenosis. 4. Degenerative changes are progressed from the prior.    EKG: 08/05/20: Normal sinus rhythm Low voltage QRS Borderline ECG Confirmed by Lennie Odor [phone]) on 08/05/2020 7:46:50 PM   CV: N/A  Past Medical History:  Diagnosis Date   Anxiety    Arthritis    Bipolar disorder (HCC)    Depression    Hyperlipidemia    Hypertension    Substance abuse (HCC)    recovered - alcohol 2015, drugs 2012    Past Surgical History:  Procedure Laterality Date   BACK SURGERY  2012   CARPAL TUNNEL RELEASE Left    EXCISION MASS UPPER EXTREMETIES Left 08/22/2017   Procedure: LEFT THUMB EXCISION MASS NAILBED WITH DEBRIDEMENT OF INTERPHALANGEAL JOINT;  Surgeon: Cindee Salt, MD;  Location: [facility];  Service: Orthopedics;  Laterality: Left;   FRACTURE SURGERY     right hand   HAND SURGERY  1988   SPINE SURGERY     , and lumbar decompression   TONSILLECTOMY AND ADENOIDECTOMY     TOTAL HIP ARTHROPLASTY Right 04/03/2019   Procedure: RIGHT TOTAL HIP ARTHROPLASTY ANTERIOR APPROACH;  Surgeon: Kathryne Hitch, MD;  Location: [facility];  Service: Orthopedics;  Laterality: Right;    MEDICATIONS:  acetaminophen (TYLENOL) 500 MG tablet   atorvastatin (LIPITOR) 40 MG tablet   diclofenac (VOLTAREN) 75 MG EC tablet   lisinopril (ZESTRIL) 5 MG tablet   magnesium oxide (MAG-OX) 400 MG tablet   Melatonin 10 MG TABS   Omega-3 Fatty Acids (FISH OIL)  1000 MG CAPS   Potassium 99 MG TABS   QUEtiapine (SEROQUEL) 50 MG tablet   No current facility-administered medications for this encounter.    Shonna Chock, PA-C Surgical Short Stay/Anesthesiology [facility] Phone [phone] [facility] Phone [phone] 09/23/2020 5:30 PM

## 2020-09-30 ENCOUNTER — Other Ambulatory Visit: Payer: Self-pay

## 2020-09-30 ENCOUNTER — Other Ambulatory Visit (HOSPITAL_COMMUNITY)
Admission: RE | Admit: 2020-09-30 | Discharge: 2020-09-30 | Disposition: A | Discharge status: Home / Self Care | Payer: 59 | Source: Ambulatory Visit | Attending: Neurological Surgery | Admitting: Neurological Surgery

## 2020-09-30 DIAGNOSIS — Z20822 Contact with and (suspected) exposure to covid-19: Secondary | ICD-10-CM | POA: Diagnosis not present

## 2020-09-30 DIAGNOSIS — Z01812 Encounter for preprocedural laboratory examination: Secondary | ICD-10-CM | POA: Diagnosis present

## 2020-09-30 LAB — SARS CORONAVIRUS 2 (TAT 6-24 HRS): SARS Coronavirus 2: NEGATIVE

## 2020-10-03 ENCOUNTER — Inpatient Hospital Stay (HOSPITAL_COMMUNITY): Payer: 59

## 2020-10-03 ENCOUNTER — Inpatient Hospital Stay (HOSPITAL_COMMUNITY): Payer: 59 | Admitting: Vascular Surgery

## 2020-10-03 ENCOUNTER — Other Ambulatory Visit: Payer: Self-pay

## 2020-10-03 ENCOUNTER — Encounter (HOSPITAL_COMMUNITY)
Admission: RE | Disposition: A | Discharge status: Home / Self Care | Payer: Self-pay | Source: Ambulatory Visit | Attending: Neurological Surgery

## 2020-10-03 ENCOUNTER — Inpatient Hospital Stay (HOSPITAL_COMMUNITY): Payer: 59 | Admitting: Anesthesiology

## 2020-10-03 ENCOUNTER — Inpatient Hospital Stay (HOSPITAL_COMMUNITY)
Admission: RE | Admit: 2020-10-03 | Discharge: 2020-10-08 | DRG: 454 | Disposition: A | Discharge status: Home / Self Care | Payer: 59 | Attending: Neurological Surgery | Admitting: Neurological Surgery

## 2020-10-03 ENCOUNTER — Encounter (HOSPITAL_COMMUNITY): Payer: Self-pay | Admitting: Neurological Surgery

## 2020-10-03 DIAGNOSIS — M62838 Other muscle spasm: Secondary | ICD-10-CM | POA: Diagnosis present

## 2020-10-03 DIAGNOSIS — Z818 Family history of other mental and behavioral disorders: Secondary | ICD-10-CM | POA: Diagnosis not present

## 2020-10-03 DIAGNOSIS — F419 Anxiety disorder, unspecified: Secondary | ICD-10-CM | POA: Diagnosis present

## 2020-10-03 DIAGNOSIS — Z83438 Family history of other disorder of lipoprotein metabolism and other lipidemia: Secondary | ICD-10-CM

## 2020-10-03 DIAGNOSIS — Z833 Family history of diabetes mellitus: Secondary | ICD-10-CM | POA: Diagnosis not present

## 2020-10-03 DIAGNOSIS — E871 Hypo-osmolality and hyponatremia: Secondary | ICD-10-CM | POA: Diagnosis not present

## 2020-10-03 DIAGNOSIS — Z87891 Personal history of nicotine dependence: Secondary | ICD-10-CM

## 2020-10-03 DIAGNOSIS — E785 Hyperlipidemia, unspecified: Secondary | ICD-10-CM | POA: Diagnosis present

## 2020-10-03 DIAGNOSIS — Z8261 Family history of arthritis: Secondary | ICD-10-CM | POA: Diagnosis not present

## 2020-10-03 DIAGNOSIS — M4316 Spondylolisthesis, lumbar region: Principal | ICD-10-CM | POA: Diagnosis present

## 2020-10-03 DIAGNOSIS — Y838 Other surgical procedures as the cause of abnormal reaction of the patient, or of later complication, without mention of misadventure at the time of the procedure: Secondary | ICD-10-CM | POA: Diagnosis not present

## 2020-10-03 DIAGNOSIS — G9741 Accidental puncture or laceration of dura during a procedure: Secondary | ICD-10-CM | POA: Diagnosis not present

## 2020-10-03 DIAGNOSIS — N179 Acute kidney failure, unspecified: Secondary | ICD-10-CM | POA: Diagnosis not present

## 2020-10-03 DIAGNOSIS — G5601 Carpal tunnel syndrome, right upper limb: Secondary | ICD-10-CM | POA: Diagnosis present

## 2020-10-03 DIAGNOSIS — M48062 Spinal stenosis, lumbar region with neurogenic claudication: Secondary | ICD-10-CM | POA: Diagnosis present

## 2020-10-03 DIAGNOSIS — I10 Essential (primary) hypertension: Secondary | ICD-10-CM | POA: Diagnosis present

## 2020-10-03 DIAGNOSIS — M4726 Other spondylosis with radiculopathy, lumbar region: Secondary | ICD-10-CM | POA: Diagnosis present

## 2020-10-03 DIAGNOSIS — Z20822 Contact with and (suspected) exposure to covid-19: Secondary | ICD-10-CM | POA: Diagnosis present

## 2020-10-03 DIAGNOSIS — Z823 Family history of stroke: Secondary | ICD-10-CM

## 2020-10-03 DIAGNOSIS — R200 Anesthesia of skin: Secondary | ICD-10-CM | POA: Diagnosis present

## 2020-10-03 DIAGNOSIS — M21372 Foot drop, left foot: Secondary | ICD-10-CM | POA: Diagnosis present

## 2020-10-03 DIAGNOSIS — Z8249 Family history of ischemic heart disease and other diseases of the circulatory system: Secondary | ICD-10-CM

## 2020-10-03 DIAGNOSIS — J705 Respiratory conditions due to smoke inhalation: Secondary | ICD-10-CM | POA: Diagnosis not present

## 2020-10-03 DIAGNOSIS — E861 Hypovolemia: Secondary | ICD-10-CM | POA: Diagnosis not present

## 2020-10-03 DIAGNOSIS — F319 Bipolar disorder, unspecified: Secondary | ICD-10-CM | POA: Diagnosis present

## 2020-10-03 DIAGNOSIS — Z6830 Body mass index (BMI) 30.0-30.9, adult: Secondary | ICD-10-CM | POA: Diagnosis not present

## 2020-10-03 DIAGNOSIS — J029 Acute pharyngitis, unspecified: Secondary | ICD-10-CM | POA: Diagnosis not present

## 2020-10-03 DIAGNOSIS — Z800 Family history of malignant neoplasm of digestive organs: Secondary | ICD-10-CM | POA: Diagnosis not present

## 2020-10-03 DIAGNOSIS — Z419 Encounter for procedure for purposes other than remedying health state, unspecified: Secondary | ICD-10-CM

## 2020-10-03 DIAGNOSIS — E669 Obesity, unspecified: Secondary | ICD-10-CM | POA: Diagnosis present

## 2020-10-03 HISTORY — PX: TRANSFORAMINAL LUMBAR INTERBODY FUSION (TLIF) WITH PEDICLE SCREW FIXATION 3 LEVEL: SHX6143

## 2020-10-03 HISTORY — PX: CARPAL TUNNEL RELEASE: SHX101

## 2020-10-03 SURGERY — TRANSFORAMINAL LUMBAR INTERBODY FUSION (TLIF) WITH PEDICLE SCREW FIXATION 3 LEVEL
Anesthesia: General | Site: Spine Lumbar | Laterality: Right

## 2020-10-03 MED ORDER — HYDROMORPHONE HCL 1 MG/ML IJ SOLN
INTRAMUSCULAR | Status: AC
  Filled 2020-10-03: qty 0.5

## 2020-10-03 MED ORDER — CEFAZOLIN SODIUM-DEXTROSE 2-4 GM/100ML-% IV SOLN
2.0000 g | INTRAVENOUS | Status: AC
  Administered 2020-10-03 (×2): 2 g via INTRAVENOUS

## 2020-10-03 MED ORDER — CHLORHEXIDINE GLUCONATE 0.12 % MT SOLN
OROMUCOSAL | Status: AC
  Administered 2020-10-03: 15 mL via OROMUCOSAL
  Filled 2020-10-03: qty 15

## 2020-10-03 MED ORDER — THROMBIN 5000 UNITS EX SOLR
CUTANEOUS | Status: AC
  Filled 2020-10-03: qty 5000

## 2020-10-03 MED ORDER — LISINOPRIL 5 MG PO TABS
5.0000 mg | ORAL_TABLET | Freq: Every day | ORAL | Status: DC
  Administered 2020-10-03 – 2020-10-08 (×5): 5 mg via ORAL
  Filled 2020-10-03 (×5): qty 1

## 2020-10-03 MED ORDER — PHENOL 1.4 % MT LIQD: 1.0000 | OROMUCOSAL | Status: DC | PRN

## 2020-10-03 MED ORDER — FENTANYL CITRATE (PF) 100 MCG/2ML IJ SOLN
INTRAMUSCULAR | Status: AC
  Filled 2020-10-03: qty 2

## 2020-10-03 MED ORDER — MENTHOL 3 MG MT LOZG: 1.0000 | LOZENGE | OROMUCOSAL | Status: DC | PRN

## 2020-10-03 MED ORDER — BUPIVACAINE HCL (PF) 0.5 % IJ SOLN
INTRAMUSCULAR | Status: AC
  Filled 2020-10-03: qty 30

## 2020-10-03 MED ORDER — ONDANSETRON HCL 4 MG/2ML IJ SOLN
INTRAMUSCULAR | Status: DC | PRN
  Administered 2020-10-03: 4 mg via INTRAVENOUS

## 2020-10-03 MED ORDER — QUETIAPINE FUMARATE 50 MG PO TABS
150.0000 mg | ORAL_TABLET | Freq: Every day | ORAL | Status: DC
  Administered 2020-10-03 – 2020-10-07 (×5): 150 mg via ORAL
  Filled 2020-10-03 (×5): qty 1

## 2020-10-03 MED ORDER — OXYCODONE HCL 5 MG/5ML PO SOLN: 5.0000 mg | Freq: Once | ORAL | Status: DC | PRN

## 2020-10-03 MED ORDER — LIDOCAINE HCL (PF) 1 % IJ SOLN
INTRAMUSCULAR | Status: AC
  Filled 2020-10-03: qty 30

## 2020-10-03 MED ORDER — LIDOCAINE-EPINEPHRINE 1 %-1:100000 IJ SOLN
INTRAMUSCULAR | Status: DC | PRN
  Administered 2020-10-03: 4.5 mL

## 2020-10-03 MED ORDER — CHLORHEXIDINE GLUCONATE CLOTH 2 % EX PADS: 6.0000 | MEDICATED_PAD | Freq: Once | CUTANEOUS | Status: DC

## 2020-10-03 MED ORDER — ATORVASTATIN CALCIUM 40 MG PO TABS
40.0000 mg | ORAL_TABLET | Freq: Every day | ORAL | Status: DC
  Administered 2020-10-03 – 2020-10-08 (×6): 40 mg via ORAL
  Filled 2020-10-03 (×5): qty 1

## 2020-10-03 MED ORDER — POLYETHYLENE GLYCOL 3350 17 G PO PACK
17.0000 g | PACK | Freq: Every day | ORAL | Status: DC | PRN
  Administered 2020-10-05: 17 g via ORAL
  Filled 2020-10-03: qty 1

## 2020-10-03 MED ORDER — HYDROMORPHONE HCL 1 MG/ML IJ SOLN
INTRAMUSCULAR | Status: AC
  Filled 2020-10-03: qty 1

## 2020-10-03 MED ORDER — DEXAMETHASONE SODIUM PHOSPHATE 10 MG/ML IJ SOLN
INTRAMUSCULAR | Status: AC
  Filled 2020-10-03: qty 1

## 2020-10-03 MED ORDER — ROCURONIUM BROMIDE 10 MG/ML (PF) SYRINGE
PREFILLED_SYRINGE | INTRAVENOUS | Status: DC | PRN
  Administered 2020-10-03: 10 mg via INTRAVENOUS
  Administered 2020-10-03 (×2): 20 mg via INTRAVENOUS
  Administered 2020-10-03: 10 mg via INTRAVENOUS
  Administered 2020-10-03: 20 mg via INTRAVENOUS
  Administered 2020-10-03: 100 mg via INTRAVENOUS
  Administered 2020-10-03 (×2): 20 mg via INTRAVENOUS
  Administered 2020-10-03: 30 mg via INTRAVENOUS

## 2020-10-03 MED ORDER — PROPOFOL 10 MG/ML IV BOLUS
INTRAVENOUS | Status: DC | PRN
  Administered 2020-10-03: 150 mg via INTRAVENOUS

## 2020-10-03 MED ORDER — ACETAMINOPHEN 160 MG/5ML PO SOLN: 325.0000 mg | ORAL | Status: DC | PRN

## 2020-10-03 MED ORDER — PROPOFOL 10 MG/ML IV BOLUS
INTRAVENOUS | Status: AC
  Filled 2020-10-03: qty 20

## 2020-10-03 MED ORDER — MELATONIN 5 MG PO TABS
10.0000 mg | ORAL_TABLET | Freq: Every day | ORAL | Status: DC
  Administered 2020-10-03 – 2020-10-07 (×5): 10 mg via ORAL
  Filled 2020-10-03 (×5): qty 2

## 2020-10-03 MED ORDER — CYCLOBENZAPRINE HCL 10 MG PO TABS
10.0000 mg | ORAL_TABLET | Freq: Three times a day (TID) | ORAL | Status: DC | PRN
  Administered 2020-10-03 – 2020-10-08 (×13): 10 mg via ORAL
  Filled 2020-10-03 (×12): qty 1

## 2020-10-03 MED ORDER — HYDROMORPHONE HCL 1 MG/ML IJ SOLN
0.5000 mg | INTRAMUSCULAR | Status: DC | PRN
  Administered 2020-10-03 (×2): 0.5 mg via INTRAVENOUS

## 2020-10-03 MED ORDER — ONDANSETRON HCL 4 MG/2ML IJ SOLN
4.0000 mg | Freq: Four times a day (QID) | INTRAMUSCULAR | Status: DC | PRN
  Administered 2020-10-03 – 2020-10-05 (×3): 4 mg via INTRAVENOUS
  Filled 2020-10-03 (×3): qty 2

## 2020-10-03 MED ORDER — PHENYLEPHRINE 40 MCG/ML (10ML) SYRINGE FOR IV PUSH (FOR BLOOD PRESSURE SUPPORT)
PREFILLED_SYRINGE | INTRAVENOUS | Status: AC
  Filled 2020-10-03: qty 10

## 2020-10-03 MED ORDER — ACETAMINOPHEN 325 MG PO TABS: 325.0000 mg | ORAL_TABLET | ORAL | Status: DC | PRN

## 2020-10-03 MED ORDER — DEXAMETHASONE SODIUM PHOSPHATE 10 MG/ML IJ SOLN
INTRAMUSCULAR | Status: DC | PRN
  Administered 2020-10-03: 10 mg via INTRAVENOUS

## 2020-10-03 MED ORDER — OXYCODONE HCL 5 MG PO TABS: 5.0000 mg | ORAL_TABLET | Freq: Once | ORAL | Status: DC | PRN

## 2020-10-03 MED ORDER — ACETAMINOPHEN 325 MG PO TABS
650.0000 mg | ORAL_TABLET | ORAL | Status: DC | PRN
  Administered 2020-10-05 – 2020-10-08 (×11): 650 mg via ORAL
  Filled 2020-10-03 (×11): qty 2

## 2020-10-03 MED ORDER — LIDOCAINE-EPINEPHRINE 1 %-1:100000 IJ SOLN
INTRAMUSCULAR | Status: AC
  Filled 2020-10-03: qty 1

## 2020-10-03 MED ORDER — CEFAZOLIN SODIUM-DEXTROSE 2-4 GM/100ML-% IV SOLN
2.0000 g | Freq: Three times a day (TID) | INTRAVENOUS | Status: AC
Start: 2020-10-04 — End: 2020-10-04
  Administered 2020-10-04 (×2): 2 g via INTRAVENOUS
  Filled 2020-10-03 (×2): qty 100

## 2020-10-03 MED ORDER — ONDANSETRON HCL 4 MG PO TABS
4.0000 mg | ORAL_TABLET | Freq: Four times a day (QID) | ORAL | Status: DC | PRN
  Administered 2020-10-06: 4 mg via ORAL
  Filled 2020-10-03 (×2): qty 1

## 2020-10-03 MED ORDER — HYDROMORPHONE HCL 1 MG/ML IJ SOLN
INTRAMUSCULAR | Status: DC | PRN
  Administered 2020-10-03: .25 mg via INTRAVENOUS
  Administered 2020-10-03: .5 mg via INTRAVENOUS
  Administered 2020-10-03: .25 mg via INTRAVENOUS

## 2020-10-03 MED ORDER — MIDAZOLAM HCL 2 MG/2ML IJ SOLN
INTRAMUSCULAR | Status: AC
  Filled 2020-10-03: qty 2

## 2020-10-03 MED ORDER — LIDOCAINE 2% (20 MG/ML) 5 ML SYRINGE
INTRAMUSCULAR | Status: AC
  Filled 2020-10-03: qty 5

## 2020-10-03 MED ORDER — OXYCODONE HCL 5 MG PO TABS
10.0000 mg | ORAL_TABLET | ORAL | Status: DC | PRN
  Administered 2020-10-03 – 2020-10-04 (×5): 10 mg via ORAL
  Filled 2020-10-03 (×5): qty 2

## 2020-10-03 MED ORDER — 0.9 % SODIUM CHLORIDE (POUR BTL) OPTIME
TOPICAL | Status: DC | PRN
  Administered 2020-10-03 (×2): 1000 mL

## 2020-10-03 MED ORDER — ONDANSETRON HCL 4 MG/2ML IJ SOLN: 4.0000 mg | Freq: Once | INTRAMUSCULAR | Status: DC | PRN

## 2020-10-03 MED ORDER — SUGAMMADEX SODIUM 200 MG/2ML IV SOLN
INTRAVENOUS | Status: DC | PRN
  Administered 2020-10-03: 200 mg via INTRAVENOUS

## 2020-10-03 MED ORDER — THROMBIN 5000 UNITS EX SOLR
OROMUCOSAL | Status: DC | PRN
  Administered 2020-10-03: 5 mL via TOPICAL

## 2020-10-03 MED ORDER — HYDROMORPHONE HCL 1 MG/ML IJ SOLN
1.0000 mg | INTRAMUSCULAR | Status: DC | PRN
  Administered 2020-10-04 (×6): 1 mg via INTRAVENOUS
  Filled 2020-10-03 (×6): qty 1

## 2020-10-03 MED ORDER — OXYCODONE HCL 5 MG PO TABS: 5.0000 mg | ORAL_TABLET | ORAL | Status: DC | PRN

## 2020-10-03 MED ORDER — ORAL CARE MOUTH RINSE: 15.0000 mL | Freq: Once | OROMUCOSAL | Status: AC

## 2020-10-03 MED ORDER — FENTANYL CITRATE (PF) 100 MCG/2ML IJ SOLN
25.0000 ug | INTRAMUSCULAR | Status: DC | PRN
  Administered 2020-10-03 (×3): 50 ug via INTRAVENOUS

## 2020-10-03 MED ORDER — MIDAZOLAM HCL 5 MG/5ML IJ SOLN
INTRAMUSCULAR | Status: DC | PRN
Start: 2020-10-03 — End: 2020-10-03
  Administered 2020-10-03: 2 mg via INTRAVENOUS

## 2020-10-03 MED ORDER — FENTANYL CITRATE (PF) 250 MCG/5ML IJ SOLN
INTRAMUSCULAR | Status: AC
  Filled 2020-10-03: qty 5

## 2020-10-03 MED ORDER — CYCLOBENZAPRINE HCL 10 MG PO TABS
ORAL_TABLET | ORAL | Status: AC
  Filled 2020-10-03: qty 1

## 2020-10-03 MED ORDER — BUPIVACAINE HCL (PF) 0.5 % IJ SOLN
INTRAMUSCULAR | Status: DC | PRN
  Administered 2020-10-03: 4.5 mL
  Administered 2020-10-03: 4 mL

## 2020-10-03 MED ORDER — CEFAZOLIN SODIUM-DEXTROSE 2-4 GM/100ML-% IV SOLN
INTRAVENOUS | Status: AC
  Filled 2020-10-03: qty 100

## 2020-10-03 MED ORDER — ONDANSETRON HCL 4 MG/2ML IJ SOLN
INTRAMUSCULAR | Status: AC
  Filled 2020-10-03: qty 2

## 2020-10-03 MED ORDER — CEFAZOLIN SODIUM 1 G IJ SOLR
INTRAMUSCULAR | Status: AC
  Filled 2020-10-03: qty 20

## 2020-10-03 MED ORDER — MEPERIDINE HCL 25 MG/ML IJ SOLN: 6.2500 mg | INTRAMUSCULAR | Status: DC | PRN

## 2020-10-03 MED ORDER — LACTATED RINGERS IV SOLN: INTRAVENOUS | Status: DC

## 2020-10-03 MED ORDER — ROCURONIUM BROMIDE 10 MG/ML (PF) SYRINGE
PREFILLED_SYRINGE | INTRAVENOUS | Status: AC
  Filled 2020-10-03: qty 10

## 2020-10-03 MED ORDER — SODIUM CHLORIDE 0.9% FLUSH: 3.0000 mL | INTRAVENOUS | Status: DC | PRN

## 2020-10-03 MED ORDER — LIDOCAINE 2% (20 MG/ML) 5 ML SYRINGE
INTRAMUSCULAR | Status: DC | PRN
  Administered 2020-10-03: 100 mg via INTRAVENOUS

## 2020-10-03 MED ORDER — FENTANYL CITRATE (PF) 250 MCG/5ML IJ SOLN
INTRAMUSCULAR | Status: DC | PRN
  Administered 2020-10-03 (×3): 50 ug via INTRAVENOUS
  Administered 2020-10-03: 100 ug via INTRAVENOUS

## 2020-10-03 MED ORDER — ACETAMINOPHEN 650 MG RE SUPP: 650.0000 mg | RECTAL | Status: DC | PRN

## 2020-10-03 MED ORDER — SODIUM CHLORIDE 0.9% FLUSH
3.0000 mL | Freq: Two times a day (BID) | INTRAVENOUS | Status: DC
  Administered 2020-10-03 – 2020-10-07 (×7): 3 mL via INTRAVENOUS

## 2020-10-03 MED ORDER — DOCUSATE SODIUM 100 MG PO CAPS
100.0000 mg | ORAL_CAPSULE | Freq: Two times a day (BID) | ORAL | Status: DC
  Administered 2020-10-03 – 2020-10-08 (×9): 100 mg via ORAL
  Filled 2020-10-03 (×10): qty 1

## 2020-10-03 MED ORDER — CHLORHEXIDINE GLUCONATE 0.12 % MT SOLN: 15.0000 mL | Freq: Once | OROMUCOSAL | Status: AC

## 2020-10-03 MED ORDER — SODIUM CHLORIDE 0.9 % IV SOLN
250.0000 mL | INTRAVENOUS | Status: DC
  Administered 2020-10-03: 250 mL via INTRAVENOUS

## 2020-10-03 MED ORDER — ALBUMIN HUMAN 5 % IV SOLN: INTRAVENOUS | Status: DC | PRN

## 2020-10-03 SURGICAL SUPPLY — 104 items
ADH SKN CLS APL DERMABOND .7 (GAUZE/BANDAGES/DRESSINGS) ×2
ADH SKN CLS LQ APL DERMABOND (GAUZE/BANDAGES/DRESSINGS) ×2
APL SKNCLS STERI-STRIP NONHPOA (GAUZE/BANDAGES/DRESSINGS)
BAG COUNTER SPONGE SURGICOUNT (BAG) ×4 IMPLANT
BAG SPNG CNTER NS LX DISP (BAG) ×4
BAND INSRT 18 STRL LF DISP RB (MISCELLANEOUS) ×4
BAND RUBBER #18 3X1/16 STRL (MISCELLANEOUS) ×6 IMPLANT
BASKET BONE COLLECTION (BASKET) ×3 IMPLANT
BENZOIN TINCTURE PRP APPL 2/3 (GAUZE/BANDAGES/DRESSINGS) IMPLANT
BLADE CLIPPER SURG (BLADE) IMPLANT
BLADE SURG 11 STRL SS (BLADE) ×3 IMPLANT
BLADE SURG 15 STRL LF DISP TIS (BLADE) ×2 IMPLANT
BLADE SURG 15 STRL SS (BLADE) ×3
BNDG CMPR 75X41 PLY HI ABS (GAUZE/BANDAGES/DRESSINGS) ×2
BNDG GAUZE ELAST 4 BULKY (GAUZE/BANDAGES/DRESSINGS) ×3 IMPLANT
BNDG STRETCH 4X75 STRL LF (GAUZE/BANDAGES/DRESSINGS) ×3 IMPLANT
BUR MATCHSTICK NEURO 3.0 LAGG (BURR) ×3 IMPLANT
BUR PRECISION FLUTE 5.0 (BURR) ×4 IMPLANT
CABLE BIPOLOR RESECTION CORD (MISCELLANEOUS) ×2 IMPLANT
CAGE EXP CATALYFT 9 (Plate) ×2 IMPLANT
CAGE INTERBODY PL LG 7X26.5X24 (Cage) ×1 IMPLANT
CANISTER SUCT 3000ML PPV (MISCELLANEOUS) ×3 IMPLANT
CNTNR URN SCR LID CUP LEK RST (MISCELLANEOUS) ×2 IMPLANT
CONT SPEC 4OZ STRL OR WHT (MISCELLANEOUS) ×3
CORD BIPOLAR FORCEPS 12FT (ELECTRODE) ×1 IMPLANT
COVER BACK TABLE 60X90IN (DRAPES) ×3 IMPLANT
DECANTER SPIKE VIAL GLASS SM (MISCELLANEOUS) ×3 IMPLANT
DERMABOND ADHESIVE PROPEN (GAUZE/BANDAGES/DRESSINGS) ×1
DERMABOND ADVANCED (GAUZE/BANDAGES/DRESSINGS) ×1
DERMABOND ADVANCED .7 DNX12 (GAUZE/BANDAGES/DRESSINGS) ×2 IMPLANT
DERMABOND ADVANCED .7 DNX6 (GAUZE/BANDAGES/DRESSINGS) IMPLANT
DRAIN JACKSON PRATT 10MM FLAT (MISCELLANEOUS) ×1 IMPLANT
DRAPE C-ARM 42X72 X-RAY (DRAPES) ×1 IMPLANT
DRAPE C-ARMOR (DRAPES) ×1 IMPLANT
DRAPE EXTREMITY T 121X128X90 (DISPOSABLE) ×3 IMPLANT
DRAPE HALF SHEET 40X57 (DRAPES) ×3 IMPLANT
DRAPE LAPAROTOMY 100X72X124 (DRAPES) ×3 IMPLANT
DRAPE MICROSCOPE LEICA (MISCELLANEOUS) ×3 IMPLANT
DRAPE SURG 17X23 STRL (DRAPES) ×3 IMPLANT
DRSG EMULSION OIL 3X3 NADH (GAUZE/BANDAGES/DRESSINGS) ×2 IMPLANT
DRSG XEROFORM 1X8 (GAUZE/BANDAGES/DRESSINGS) ×1 IMPLANT
DURAPREP 26ML APPLICATOR (WOUND CARE) ×4 IMPLANT
ELECT REM PT RETURN 9FT ADLT (ELECTROSURGICAL) ×3
ELECTRODE REM PT RTRN 9FT ADLT (ELECTROSURGICAL) ×2 IMPLANT
EVACUATOR SILICONE 100CC (DRAIN) ×1 IMPLANT
GAUZE 4X4 16PLY ~~LOC~~+RFID DBL (SPONGE) ×3 IMPLANT
GAUZE SPONGE 4X4 12PLY STRL (GAUZE/BANDAGES/DRESSINGS) ×3 IMPLANT
GLOVE EXAM NITRILE LRG STRL (GLOVE) IMPLANT
GLOVE EXAM NITRILE XL STR (GLOVE) IMPLANT
GLOVE EXAM NITRILE XS STR PU (GLOVE) IMPLANT
GLOVE SRG 8 PF TXTR STRL LF DI (GLOVE) IMPLANT
GLOVE SURG ENC MOIS LTX SZ7.5 (GLOVE) ×2 IMPLANT
GLOVE SURG ENC MOIS LTX SZ8 (GLOVE) ×2 IMPLANT
GLOVE SURG LTX SZ7.5 (GLOVE) ×6 IMPLANT
GLOVE SURG UNDER POLY LF SZ7.5 (GLOVE) ×7 IMPLANT
GLOVE SURG UNDER POLY LF SZ8 (GLOVE) ×3
GLOVE SURG UNDER POLY LF SZ8.5 (GLOVE) ×2 IMPLANT
GOWN STRL REUS W/ TWL LRG LVL3 (GOWN DISPOSABLE) ×8 IMPLANT
GOWN STRL REUS W/ TWL XL LVL3 (GOWN DISPOSABLE) IMPLANT
GOWN STRL REUS W/TWL 2XL LVL3 (GOWN DISPOSABLE) ×1 IMPLANT
GOWN STRL REUS W/TWL LRG LVL3 (GOWN DISPOSABLE) ×9
GOWN STRL REUS W/TWL XL LVL3 (GOWN DISPOSABLE) ×3
GRAFT BN 10X1XDBM MAGNIFUSE (Bone Implant) IMPLANT
GRAFT BONE MAGNIFUSE 1X10CM (Bone Implant) ×3 IMPLANT
HEMOSTAT POWDER KIT SURGIFOAM (HEMOSTASIS) ×3 IMPLANT
KIT BASIN OR (CUSTOM PROCEDURE TRAY) ×3 IMPLANT
KIT POSITION SURG JACKSON T1 (MISCELLANEOUS) ×3 IMPLANT
KIT TURNOVER KIT B (KITS) ×3 IMPLANT
MILL MEDIUM DISP (BLADE) ×1 IMPLANT
NDL HYPO 18GX1.5 BLUNT FILL (NEEDLE) IMPLANT
NDL HYPO 25X1 1.5 SAFETY (NEEDLE) ×2 IMPLANT
NDL SPNL 18GX3.5 QUINCKE PK (NEEDLE) IMPLANT
NEEDLE HYPO 18GX1.5 BLUNT FILL (NEEDLE) IMPLANT
NEEDLE HYPO 22GX1.5 SAFETY (NEEDLE) ×3 IMPLANT
NEEDLE HYPO 25X1 1.5 SAFETY (NEEDLE) ×3 IMPLANT
NEEDLE SPNL 18GX3.5 QUINCKE PK (NEEDLE) ×3 IMPLANT
NS IRRIG 1000ML POUR BTL (IV SOLUTION) ×4 IMPLANT
PACK LAMINECTOMY NEURO (CUSTOM PROCEDURE TRAY) ×3 IMPLANT
PACK SURGICAL SETUP 50X90 (CUSTOM PROCEDURE TRAY) ×3 IMPLANT
PAD ARMBOARD 7.5X6 YLW CONV (MISCELLANEOUS) ×8 IMPLANT
ROD CVD SOLERA 5.5X90 (Rod) ×1 IMPLANT
ROD SOLERA 80MM (Rod) ×3 IMPLANT
ROD SOLERA 80X5.5XNS TI (Rod) IMPLANT
SCREW SET SOLERA (Screw) ×24 IMPLANT
SCREW SET SOLERA TI5.5 (Screw) IMPLANT
SCREW SOLERA 6.5X35MM (Screw) ×8 IMPLANT
SPONGE SURGIFOAM ABS GEL 100 (HEMOSTASIS) IMPLANT
SPONGE T-LAP 4X18 ~~LOC~~+RFID (SPONGE) ×1 IMPLANT
STOCKINETTE 4X48 STRL (DRAPES) ×3 IMPLANT
STRIP CLOSURE SKIN 1/2X4 (GAUZE/BANDAGES/DRESSINGS) IMPLANT
SUT ETHILON 3 0 FSL (SUTURE) ×1 IMPLANT
SUT ETHILON 3 0 PS 1 (SUTURE) ×3 IMPLANT
SUT MNCRL AB 3-0 PS2 18 (SUTURE) ×3 IMPLANT
SUT VIC AB 0 CT1 18XCR BRD8 (SUTURE) ×2 IMPLANT
SUT VIC AB 0 CT1 8-18 (SUTURE) ×6
SUT VIC AB 2-0 CP2 18 (SUTURE) ×4 IMPLANT
SYR 3ML LL SCALE MARK (SYRINGE) IMPLANT
SYR BULB EAR ULCER 3OZ GRN STR (SYRINGE) ×3 IMPLANT
SYR CONTROL 10ML LL (SYRINGE) ×3 IMPLANT
TOWEL GREEN STERILE (TOWEL DISPOSABLE) ×3 IMPLANT
TOWEL GREEN STERILE FF (TOWEL DISPOSABLE) ×3 IMPLANT
TRAY FOLEY MTR SLVR 16FR STAT (SET/KITS/TRAYS/PACK) ×3 IMPLANT
UNDERPAD 30X36 HEAVY ABSORB (UNDERPADS AND DIAPERS) ×2 IMPLANT
WATER STERILE IRR 1000ML POUR (IV SOLUTION) ×3 IMPLANT

## 2020-10-03 NOTE — Anesthesia Procedure Notes (Signed)
Procedure Name: Intubation Date/Time: 10/03/2020 12:28 PM Performed by: Amadeo Garnet, CRNA Pre-anesthesia Checklist: Patient identified, Emergency Drugs available, Suction available and Patient being monitored Patient Re-evaluated:Patient Re-evaluated prior to induction Oxygen Delivery Method: Circle system utilized Preoxygenation: Pre-oxygenation with 100% oxygen Induction Type: IV induction Ventilation: Mask ventilation without difficulty and Oral airway inserted - appropriate to patient size Laryngoscope Size: Mac and 4 Grade View: Grade I Tube type: Oral Tube size: 7.5 mm Number of attempts: 1 Airway Equipment and Method: Stylet and Oral airway Placement Confirmation: ETT inserted through vocal cords under direct vision, positive ETCO2 and breath sounds checked- equal and bilateral Secured at: 22 cm Tube secured with: Tape Dental Injury: Teeth and Oropharynx as per pre-operative assessment

## 2020-10-03 NOTE — H&P (Signed)
Surgical H&P Update  HPI: 55 y.o. man with a history of back and left lower extremity radicular pain, numbness, and weakness as well as neurogenic claudication symptoms. 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 Symptoms have been refractory to non-surgical treatment, surgery was recommended, for which the patient presents today. No changes in health since he was last seen. Still having pain and weakness, wishes to proceed with surgery.  PMHx:  Past Medical History:  Diagnosis Date   Anxiety    Arthritis    Bipolar disorder (HCC)    Depression    Hyperlipidemia    Hypertension    Substance abuse (HCC)    recovered - alcohol 2015, drugs 2012   FamHx:  Family History  Problem Relation Age of Onset   Arthritis Mother    Cancer Father        barretts esophagus   Depression Father    Diabetes Father    Hyperlipidemia Father    Hypertension Father    Stroke Maternal Grandmother    Heart disease Maternal Grandfather 72   Dementia Paternal Grandmother    Diabetes Paternal Grandfather    Depression Maternal Aunt    Alcohol abuse Maternal Aunt    Alcohol abuse Maternal Uncle    Drug abuse Paternal Uncle    SocHx:  reports that he quit smoking about 9 years ago. His smoking use included cigarettes. He has never used smokeless tobacco. He reports that he does not currently use alcohol. He reports that he does not currently use drugs after having used the following drugs: "Crack" cocaine.  Physical Exam: AOx3, PERRL, FS, TM  Strength 5/5 x4 except left 4-/5 EHL / TA, SILTx4 except R L5 distribution numbness with R hand grip weakness and R median numbness  Assesment/Plan: 55 y.o. man with foot drop, back pain, radicular pain, neurogenic claudication, here for L3-4, L4-5, L5-S1 open TLIF and R CTR. Risks, benefits, and alternatives discussed and the patient would like to continue with surgery.  -OR today -4NP  post-op  Jadene Pierini, MD 10/03/20 11:33 AM

## 2020-10-03 NOTE — Progress Notes (Signed)
Patient arrived to the unit from PACU, transported by RN. Patient alert and orientated x4, walked from stretcher to bed. Vital signs are within normal. Will continue monitoring.

## 2020-10-03 NOTE — Progress Notes (Signed)
Dr Marcia Brash notified of of drainage from JP. No new orders at this time. Will continue to monitor.

## 2020-10-03 NOTE — Transfer of Care (Signed)
Immediate Anesthesia Transfer of Care Note  Patient: Tim [name]  Procedure(s) Performed: Lumbar three-four, Lumbar four-five, Lumbar five-Sacral one open decompression with transforaminal lumbar interbody fusion, posterolateral instrumented fusion (Spine Lumbar) RIGHT CARPAL TUNNEL RELEASE (Right)  Patient Location: PACU  Anesthesia Type:General  Level of Consciousness: awake, alert  and oriented  Airway & Oxygen Therapy: Patient Spontanous Breathing and Patient connected to nasal cannula oxygen  Post-op Assessment: Report given to RN, Post -op Vital signs reviewed and stable and Patient moving all extremities  Post vital signs: Reviewed and stable  Last Vitals:  Vitals Value Taken Time  BP 142/93 10/03/20 1911  Temp    Pulse 104 10/03/20 1914  Resp 26 10/03/20 1914  SpO2 100 % 10/03/20 1914  Vitals shown include unvalidated device data.  Last Pain:  Vitals:   10/03/20 0916  TempSrc:   PainSc: 4       Patients Stated Pain Goal: 0 (10/03/20 0916)  Complications: No notable events documented.

## 2020-10-03 NOTE — Op Note (Signed)
PATIENT: Tim [name]  DAY OF SURGERY: 10/03/20   PRE-OPERATIVE DIAGNOSIS:  Lumbar radiculopathy, lumbar stenosis with neurogenic claudication, lumbar spondylolisthesis, carpal tunnel syndrome   POST-OPERATIVE DIAGNOSIS:  Same   PROCEDURE 1:  L3-4, L4-5, L5-S1 laminectomies, foraminotomies, right L3-4/L4-5/L5-S1 TLIFs, L3-S1 posterolateral instrumented fusion; Procedure 2: right carpal tunnel release   SURGEON:  Surgeon(s) and Role:    Jadene Pierini, MD - Primary    Marikay Alar, MD - Assisting   ANESTHESIA: ETGA   BRIEF HISTORY: This is a 31  who presented with severe right carpal tunnel syndrome with axonal loss, claudication, left foot drop, back and lower extremity radicular pain due to lumbar spondylitic disease. I recommended surgery, we discussed risks, benefits, and alternatives and he wished to proceed with surgery.   OPERATIVE DETAIL: The patient was taken to the operating room and anesthesia was induced by the anesthesia team. They were placed on the OR table in the prone position with padding of all pressure points. A formal time out was performed with two patient identifiers and confirmed the operative site. The operative site was marked, hair was clipped with surgical clippers, the area was then prepped and draped in a sterile fashion. Fluoro was used to localize the operative level and a midline incision was placed to expose from L3 to S1. Subperiosteal dissection was performed bilaterally and fluoroscopy was again used to confirm the surgical level.   Decompression was performed, which consisted of laminectomies at L3-4, L4-5, and L5-S1. This was performed with a combination of high speed drill and curettes with removal of bone and ligamentum flavum until the thecal sac was decompressed. Foraminotomies were then performed on the left at the above levels. Of note, I did get a durotomy at the level of the L5 lamina - a piece of ligamentum flavum was adherent and when I  removed it, the dura came with it but left the arachnoid intact. There was no CSF egress, it was covered with a cottonoid for protection, and the arachnoid was kept intact for the remainder of the case.  Instrumentation was then performed. Fluoroscopy was used to confirm levels then assist with guidance for completion of right sided TLIFs at L3-4, L4-5, and L5-S1. These were performed by creating a complete facetectomy, identification and protection of the traversing and exiting roots, discectomy with disc prep tools, and placement of an expandable titanium interbody TLIF graft (Medtronic) with fluoroscopic guidance. Attention was then turned to to PLF.  Fluoroscopy was used as needed to assist with placement of bilateral pedicle screws (Medtronic) at L3, L4, L5, and S1 bilaterally. These were placed by localizing the pedicle with standard landmarks, drilling a pilot hole, cannulating the pedicle with an awl, palpating for pedicle wall breaches, tapping, palpating, and then placing the screw. These were connected with rods bilaterally and final tightened according to manufacturer torque specifications. The bone over the residual facets and transverse processes was thoroughly decorticated and the previously resected bone fragments were morselized and used as autograft with the addition of Magnafuse allograft (Medtronic).   A drain was placed subfascially, tunneled, and secured. All instrument and sponge counts were correct, the incision was then closed in layers.   The patient was then placed supine and the right hand was prepped and draped in a sterile fashion. A second time-out was performed with the same procedure as above. Using standard landmarks, a palmar incision was created, mastoid retractor was placed, palmar fascia divided. Of note, there was a fairly large  recurrent motor branch that was identified and protected. The TCL was then divided distally and caudally until no palpable compression was  present on the median nerve. The median nerve was freed of scar tissue with a neurolysis and the wrist was mobilized to confirm no significant compression under flexion. The wound was copiously irrigated and the incision was closed with 3-0 nylon suture. No apparent complications at the completion of the procedure, all counts were correct for procedure #2, and the patient was returned to anesthesia for emergence.   EBL:    DRAINS: Subfascial lumbar drain with JP bulb   SPECIMENS: none   Jadene Pierini, MD 10/03/20 7:05 PM

## 2020-10-04 ENCOUNTER — Encounter (HOSPITAL_COMMUNITY): Payer: Self-pay | Admitting: Neurological Surgery

## 2020-10-04 LAB — RENAL FUNCTION PANEL
Albumin: 3.3 g/dL — ABNORMAL LOW (ref 3.5–5.0)
Anion gap: 10 (ref 5–15)
BUN: 16 mg/dL (ref 6–20)
CO2: 25 mmol/L (ref 22–32)
Calcium: 8.6 mg/dL — ABNORMAL LOW (ref 8.9–10.3)
Chloride: 98 mmol/L (ref 98–111)
Creatinine, Ser: 1.27 mg/dL — ABNORMAL HIGH (ref 0.61–1.24)
GFR, Estimated: >60 mL/min (ref >60)
Glucose, Bld: 92 mg/dL (ref 70–99)
Phosphorus: 4.3 mg/dL (ref 2.5–4.6)
Potassium: 3.5 mmol/L (ref 3.5–5.1)
Sodium: 133 mmol/L — ABNORMAL LOW (ref 135–145)

## 2020-10-04 LAB — CBC
HCT: 33.1 % — ABNORMAL LOW (ref 39.0–52.0)
Hemoglobin: 11.1 g/dL — ABNORMAL LOW (ref 13.0–17.0)
MCH: 29.8 pg (ref 26.0–34.0)
MCHC: 33.5 g/dL (ref 30.0–36.0)
MCV: 89 fL (ref 80.0–100.0)
Platelets: 210 10*3/uL (ref 150–400)
RBC: 3.72 MIL/uL — ABNORMAL LOW (ref 4.22–5.81)
RDW: 12.5 % (ref 11.5–15.5)
WBC: 14.2 10*3/uL — ABNORMAL HIGH (ref 4.0–10.5)
nRBC: 0 % (ref 0.0–0.2)

## 2020-10-04 MED ORDER — HYDROMORPHONE HCL 1 MG/ML IJ SOLN
1.0000 mg | INTRAMUSCULAR | Status: DC | PRN
  Administered 2020-10-05 (×2): 1 mg via INTRAVENOUS
  Filled 2020-10-04 (×2): qty 1

## 2020-10-04 MED ORDER — OXYCODONE HCL 5 MG PO TABS
5.0000 mg | ORAL_TABLET | ORAL | Status: DC | PRN
  Administered 2020-10-05: 5 mg via ORAL
  Filled 2020-10-04: qty 1

## 2020-10-04 MED ORDER — SODIUM CHLORIDE 0.9 % IV SOLN: INTRAVENOUS | Status: DC

## 2020-10-04 MED ORDER — CHLORHEXIDINE GLUCONATE CLOTH 2 % EX PADS
6.0000 | MEDICATED_PAD | Freq: Every day | CUTANEOUS | Status: DC
  Administered 2020-10-06: 6 via TOPICAL

## 2020-10-04 MED ORDER — SODIUM CHLORIDE 0.9 % IV BOLUS
1000.0000 mL | Freq: Once | INTRAVENOUS | Status: AC
  Administered 2020-10-04: 1000 mL via INTRAVENOUS

## 2020-10-04 MED ORDER — OXYCODONE HCL 5 MG PO TABS
10.0000 mg | ORAL_TABLET | ORAL | Status: DC | PRN
Start: 2020-10-04 — End: 2020-10-08
  Administered 2020-10-04 – 2020-10-06 (×7): 10 mg via ORAL
  Filled 2020-10-04 (×7): qty 2

## 2020-10-04 NOTE — Progress Notes (Signed)
Pt c/o lower back pain, pt body is shaking with labored breathing d/t severe pain in lower back. Have given pt prn dilaudid. Pt states that he feels pressure at incision site on lower back. He says "that it hurts more to lie on his back", he is lying on left side.  Dr Maurice Small was called and responds he will come look in on pt.

## 2020-10-04 NOTE — Evaluation (Signed)
Physical Therapy Evaluation Patient Details Name: Tim [name] MRN: [id number] DOB: [birth year] Today's Date: 10/04/2020  History of Present Illness  55 yo male with foot drop back pain and 9/12  s/p L3-S1 TLIF and R carpal tunnel release. PMH anxiety arthritis bipolar depression HLD HTN substance abuse  Clinical Impression  Pt presents with generalized weakness, decreased knowledge and application of precautions, impaired balance with history of falls in the last two months, impaired gait, and decreased activity tolerance. Pt to benefit from acute PT to address deficits. Pt ambulated room distance with use of RW, limited in distance by pain and tachycardia to 140 bpm. PT to progress mobility as tolerated, and will continue to follow acutely.         Recommendations for follow up therapy are one component of a multi-disciplinary discharge planning process, led by the attending physician.  Recommendations may be updated based on patient status, additional functional criteria and insurance authorization.  Follow Up Recommendations Home health PT;Supervision for mobility/OOB    Equipment Recommendations  None recommended by PT    Recommendations for Other Services       Precautions / Restrictions Precautions Precautions: Back Precaution Booklet Issued: Yes (comment) Precaution Comments: no brace; reviewed adls with back precautions Restrictions Weight Bearing Restrictions: Yes RUE Weight Bearing: Weight bear through elbow only Other Position/Activity Restrictions: s/p carpal tunnel      Mobility  Bed Mobility Overal bed mobility: Needs Assistance Bed Mobility: Rolling;Sidelying to Sit Rolling: Min assist Sidelying to sit: Mod assist Supine to sit: Mod assist     General bed mobility comments: exiting R side with cues to push with elbow, assist for trunk elevation and LE lowering over EOB.    Transfers Overall transfer level: Needs assistance Equipment used: Rolling  walker (2 wheeled) Transfers: Sit to/from Stand Sit to Stand: Min assist;From elevated surface         General transfer comment: light assist to power up and steady. Cues for hand placemenet when rising/sitting and not pushing through RUE.  Ambulation/Gait Ambulation/Gait assistance: Min assist Gait Distance (Feet): 40 Feet Assistive device: Rolling walker (2 wheeled) Gait Pattern/deviations: Step-through pattern;Decreased stride length;Trunk flexed Gait velocity: decr   General Gait Details: light assist to steady, guide pt/RW. Verbal cuing for upright posture, room navigation. HR 120s-130s during gait  Stairs            Wheelchair Mobility    Modified Rankin (Stroke Patients Only)       Balance Overall balance assessment: Mild deficits observed, not formally tested                                           Pertinent Vitals/Pain Pain Assessment: 0-10 Pain Score: 9  Pain Location: back Pain Descriptors / Indicators: Grimacing;Discomfort Pain Intervention(s): Limited activity within patient's tolerance;Monitored during session;Repositioned    Home Living Family/patient expects to be discharged to:: Private residence Living Arrangements: Children (46 yo and 74 yo daughters) Available Help at Discharge: Family;Available PRN/intermittently Type of Home: House Home Access: Stairs to enter Entrance Stairs-Rails: None Entrance Stairs-Number of Steps: 2 Home Layout: One level Home Equipment: Walker - 2 wheels;Cane - single point;Bedside commode Additional Comments: falling 3 times in last month. working /driving    Prior Function Level of Independence: Independent with assistive device(s)               Hand  Dominance   Dominant Hand: Right    Extremity/Trunk Assessment   Upper Extremity Assessment Upper Extremity Assessment: Defer to OT evaluation    Lower Extremity Assessment Lower Extremity Assessment: Generalized weakness  (reports L toe and foot numbness/tingling prior to surgery, states not present now)    Cervical / Trunk Assessment Cervical / Trunk Assessment: Other exceptions Cervical / Trunk Exceptions: s/p TLIF  Communication   Communication: No difficulties  Cognition Arousal/Alertness: Awake/alert Behavior During Therapy: WFL for tasks assessed/performed Overall Cognitive Status: Within Functional Limits for tasks assessed                                        General Comments General comments (skin integrity, edema, etc.): HR 140 RA . pt sitting with HR 104-120    Exercises     Assessment/Plan    PT Assessment Patient needs continued PT services  PT Problem List Decreased strength;Decreased mobility;Decreased activity tolerance;Decreased balance;Decreased knowledge of use of DME;Decreased safety awareness;Decreased knowledge of precautions;Pain       PT Treatment Interventions DME instruction;Therapeutic activities;Gait training;Therapeutic exercise;Patient/family education;Balance training;Stair training;Neuromuscular re-education;Functional mobility training    PT Goals (Current goals can be found in the Care Plan section)  Acute Rehab PT Goals Patient Stated Goal: home PT Goal Formulation: With patient Time For Goal Achievement: 10/18/20 Potential to Achieve Goals: Good    Frequency Min 5X/week   Barriers to discharge        Co-evaluation   Reason for Co-Treatment: For patient/therapist safety;To address functional/ADL transfers PT goals addressed during session: Mobility/safety with mobility;Balance OT goals addressed during session: ADL's and self-care;Proper use of Adaptive equipment and DME;Strengthening/ROM       AM-PAC PT "6 Clicks" Mobility  Outcome Measure Help needed turning from your back to your side while in a flat bed without using bedrails?: A Little Help needed moving from lying on your back to sitting on the side of a flat bed without  using bedrails?: A Little Help needed moving to and from a bed to a chair (including a wheelchair)?: A Little Help needed standing up from a chair using your arms (e.g., wheelchair or bedside chair)?: A Little Help needed to walk in hospital room?: A Little Help needed climbing 3-5 steps with a railing? : A Lot 6 Click Score: 17    End of Session   Activity Tolerance: Patient tolerated treatment well Patient left: in chair;with call bell/phone within reach;with chair alarm set Nurse Communication: Mobility status PT Visit Diagnosis: Other abnormalities of gait and mobility (R26.89);Pain Pain - Right/Left:  (low) Pain - part of body:  (back)    Time: 2706-2376 PT Time Calculation (min) (ACUTE ONLY): 26 min   Charges:   PT Evaluation $PT Eval Low Complexity: 1 Low         Marua Qin S, PT DPT Acute Rehabilitation Services Pager [phone]  Office [phone]   Avin Gibbons E Stroup 10/04/2020, 10:00 AM

## 2020-10-04 NOTE — Anesthesia Postprocedure Evaluation (Signed)
Anesthesia Post Note  Patient: Tim [name]  Procedure(s) Performed: Lumbar three-four, Lumbar four-five, Lumbar five-Sacral one open decompression with transforaminal lumbar interbody fusion, posterolateral instrumented fusion (Spine Lumbar) RIGHT CARPAL TUNNEL RELEASE (Right)     Patient location during evaluation: PACU Anesthesia Type: General Level of consciousness: awake and alert Pain management: pain level controlled Vital Signs Assessment: post-procedure vital signs reviewed and stable Respiratory status: spontaneous breathing, nonlabored ventilation, respiratory function stable and patient connected to nasal cannula oxygen Cardiovascular status: blood pressure returned to baseline and stable Postop Assessment: no apparent nausea or vomiting Anesthetic complications: no   No notable events documented.  Last Vitals:  Vitals:   10/03/20 2211 10/03/20 2300  BP: (!) 150/87 (!) 142/81  Pulse:  86  Resp:  14  Temp:  36.6 C  SpO2:  95%    Last Pain:  Vitals:   10/04/20 0100  TempSrc:   PainSc: 4                  Kratos Ruscitti S

## 2020-10-04 NOTE — Progress Notes (Signed)
Neurosurgery Service Progress Note  Subjective: No acute events overnight. Back pain, no leg pain, hand soreness minimal, some tachycardia when working w/ PT but no presyncopal Sx   Objective: Vitals:   10/04/20 0302 10/04/20 0704 10/04/20 0820 10/04/20 1100  BP: 118/69 (!) 93/54 104/65 (!) 96/52  Pulse: 87 75 84   Resp: 20 14  16   Temp: 98.3 F (36.8 C) 97.8 F (36.6 C)  98.6 F (37 C)  TempSrc: Oral Oral  Oral  SpO2: 97% 93%    Weight:      Height:        Physical Exam: Strength 5/5 x4 except stable L foot drop / numbness and R hand in CTR dressing   Assessment & Plan: 55 y.o. man s/p R CTR, 3 level open TLIF, recovering well.  -will bolus this morning, repeat labs -continue drain  40  10/04/20 11:52 AM

## 2020-10-04 NOTE — Evaluation (Signed)
Occupational Therapy Evaluation Patient Details Name: Tim [name] MRN: [id number] DOB: 02/10/1965 Today's Date: 10/04/2020   History of Present Illness 55 yo male with foot drop back pain and 9/12  s/p L3-S1 TLIF and R carpal tunnel release. PMH anxiety arthritis bipolar depression HLD HTN substance abuse   Clinical Impression   Patient is s/p TLIF L3-S1 surgery resulting in functional limitations due to the deficits listed below (see OT problem list). Pt completed transfer min to min guard (A) from elevated surface. Pt with increased HR 140 during adl task. Pt educated on precautions with adls with mod (A) to apply during (A). Pt will d/c home with young daughters so will need to be indep with hygiene care.  Patient will benefit from skilled OT acutely to increase independence and safety with ADLS to allow discharge home.       Recommendations for follow up therapy are one component of a multi-disciplinary discharge planning process, led by the attending physician.  Recommendations may be updated based on patient status, additional functional criteria and insurance authorization.   Follow Up Recommendations  No OT follow up    Equipment Recommendations  None recommended by OT    Recommendations for Other Services       Precautions / Restrictions Precautions Precautions: Back Precaution Booklet Issued: Yes (comment) Precaution Comments: no brace reviewed adls with back precautions Restrictions Weight Bearing Restrictions: Yes RUE Weight Bearing: Weight bear through elbow only Other Position/Activity Restrictions: s/p carpal tunnel      Mobility Bed Mobility Overal bed mobility: Needs Assistance Bed Mobility: Rolling;Supine to Sit Rolling: Min assist   Supine to sit: Mod assist     General bed mobility comments: exiting R side with cues to push with elbow.    Transfers Overall transfer level: Needs assistance Equipment used: Rolling walker (2 wheeled) Transfers:  Sit to/from Stand Sit to Stand: Min assist;From elevated surface         General transfer comment: surface elevated to allow patient to power up. pt with L hand on bed and R hand on RW. pt with cues not to WB into palm    Balance Overall balance assessment: Mild deficits observed, not formally tested                                         ADL either performed or assessed with clinical judgement   ADL Overall ADL's : Needs assistance/impaired Eating/Feeding: Set up   Grooming: Set up Grooming Details (indicate cue type and reason): oral care with containers opened and extended time. pt with decrease grasp with R hand Upper Body Bathing: Moderate assistance   Lower Body Bathing: Moderate assistance   Upper Body Dressing : Moderate assistance   Lower Body Dressing: Moderate assistance Lower Body Dressing Details (indicate cue type and reason): able to thread L leg into underwear but not R leg. Pt with R hand wrapped from surgery Toilet Transfer: Minimal assistance;RW           Functional mobility during ADLs: Min guard;Rolling walker General ADL Comments: pt progressed from bed to bathroom adl task. pt with increased HR 140 with static standing for oral care. pt sustaining 120 at rest initially     Vision Baseline Vision/History: 1 Wears glasses Ability to See in Adequate Light: 1 Impaired Vision Assessment?: No apparent visual deficits     Perception  Praxis      Pertinent Vitals/Pain Pain Assessment: 0-10 Pain Score: 9  Pain Location: back Pain Descriptors / Indicators: Grimacing;Discomfort Pain Intervention(s): Repositioned;Premedicated before session;Monitored during session (reports decreased pain in standing)     Hand Dominance Right   Extremity/Trunk Assessment Upper Extremity Assessment Upper Extremity Assessment: Overall WFL for tasks assessed   Lower Extremity Assessment Lower Extremity Assessment: Defer to PT evaluation    Cervical / Trunk Assessment Cervical / Trunk Assessment: Other exceptions (s/p surgery 10/03/20)   Communication Communication Communication: No difficulties   Cognition Arousal/Alertness: Awake/alert Behavior During Therapy: WFL for tasks assessed/performed Overall Cognitive Status: Within Functional Limits for tasks assessed                                     General Comments  HR 140 RA . pt sitting with HR 104-120    Exercises     Shoulder Instructions      Home Living Family/patient expects to be discharged to:: Private residence Living Arrangements: Children (68 yo and 71 yo daughters) Available Help at Discharge: Family;Available PRN/intermittently Type of Home: House Home Access: Stairs to enter [employer] of Steps: 2 Entrance Stairs-Rails: None Home Layout: One level     Bathroom Shower/Tub: Chief Strategy Officer: Standard     Home Equipment: Environmental consultant - 2 wheels;Cane - single point;Bedside commode   Additional Comments: falling 3 times in last month. working /driving      Prior Functioning/Environment Level of Independence: Independent with assistive device(s)                 OT Problem List: Decreased strength;Decreased activity tolerance;Impaired balance (sitting and/or standing);Decreased knowledge of use of DME or AE;Decreased knowledge of precautions;Cardiopulmonary status limiting activity;Pain;Impaired UE functional use      OT Treatment/Interventions: Self-care/ADL training;Therapeutic exercise;Energy conservation;DME and/or AE instruction;Manual therapy;Therapeutic activities;Patient/family education;Balance training    OT Goals(Current goals can be found in the care plan section) Acute Rehab OT Goals Patient Stated Goal: to return home and get pain under control today OT Goal Formulation: With patient Time For Goal Achievement: 10/18/20 Potential to Achieve Goals: Good  OT Frequency: Min 2X/week    Barriers to D/C: Decreased caregiver support  will have parents PRN and x2 young daughters to (A)       Co-evaluation PT/OT/SLP Co-Evaluation/Treatment: Yes Reason for Co-Treatment: For patient/therapist safety;To address functional/ADL transfers   OT goals addressed during session: ADL's and self-care;Proper use of Adaptive equipment and DME;Strengthening/ROM      AM-PAC OT "6 Clicks" Daily Activity     Outcome Measure Help from another person eating meals?: A Little Help from another person taking care of personal grooming?: A Little Help from another person toileting, which includes using toliet, bedpan, or urinal?: A Little Help from another person bathing (including washing, rinsing, drying)?: A Little Help from another person to put on and taking off regular upper body clothing?: A Little Help from another person to put on and taking off regular lower body clothing?: A Lot 6 Click Score: 17   End of Session Equipment Utilized During Treatment: Rolling walker Nurse Communication: Mobility status;Precautions  Activity Tolerance: Patient tolerated treatment well Patient left: in chair;with call bell/phone within reach;with chair alarm set  OT Visit Diagnosis: Unsteadiness on feet (R26.81);Muscle weakness (generalized) (M62.81);Pain                Time: 9326-7124 OT  Time Calculation (min): 27 min Charges:  OT General Charges $OT Visit: 1 Visit OT Evaluation $OT Eval Moderate Complexity: 1 Mod   Brynn, OTR/L  Acute Rehabilitation Services Pager: [phone] Office: [phone] .   Mateo Flow 10/04/2020, 9:48 AM

## 2020-10-05 NOTE — Progress Notes (Signed)
At 19:30 pt was ambulated to restroom; pt called to go back to bed and reported feeling dizzy, nauseas, and in "cold sweat".  RN called for assistance and 2 RN's assisted patient back to bed.  Pt was administered zofran iv.  BP stable at this time, will continue to monitor patient.

## 2020-10-05 NOTE — Progress Notes (Signed)
Agree with student nurse assessment as noted.  Patient resting quietly without s/s distress.  Denies needs.

## 2020-10-05 NOTE — Progress Notes (Signed)
Neurosurgery Service Progress Note  Subjective: No acute events overnight, had some severe pain yesterday that resolved, sounds like muscle spasms given the self-limited nature, no recurrence of the pain but obviously still sore  Objective: Vitals:   10/04/20 2318 10/05/20 0341 10/05/20 0736 10/05/20 0745  BP:  (!) 114/98 (!) 83/44 126/66  Pulse: 68 77 70   Resp: 20  14   Temp:  99 F (37.2 C) 98.9 F (37.2 C)   TempSrc:   Oral   SpO2: 96% 97% 96%   Weight:      Height:        Physical Exam: Strength 5/5 x4 except stable L foot drop / numbness and R hand in CTR dressing     Assessment & Plan: 55 y.o. man s/p 3 level open TLIF, R CTR.  -AM labs notable for increase in Cr, HR improved but still one soft pressure on cuff this morning, will continue the IVMF, UOP 0.40ml/kg/hr overnight so responding well, hyponatremia is likely hypovolemic and should improve, Hb reassuring at 11.1 -drain output still high, continue  Jadene Pierini  10/05/20 8:26 AM

## 2020-10-05 NOTE — Progress Notes (Signed)
Physical Therapy Treatment Patient Details Name: Tim [name] MRN: [id number] DOB: 1966-01-21 Today's Date: 10/05/2020   History of Present Illness 55 yo male with foot drop back pain and 9/12  s/p L3-S1 TLIF and R carpal tunnel release. PMH anxiety arthritis bipolar depression HLD HTN substance abuse    PT Comments    Pt reports moderate to severe back pain at rest, improved with initial mobility. Pt requiring mostly supervision level of assist during session with cues for form and safety, ambulating further distance today vs yesterday. Pt also navigated x2 steps, with RLE buckling but pt corrected this. Pt progressing well, will continue to follow.    Recommendations for follow up therapy are one component of a multi-disciplinary discharge planning process, led by the attending physician.  Recommendations may be updated based on patient status, additional functional criteria and insurance authorization.  Follow Up Recommendations  Home health PT;Supervision for mobility/OOB     Equipment Recommendations  None recommended by PT    Recommendations for Other Services       Precautions / Restrictions Precautions Precautions: Back Precaution Booklet Issued: Yes (comment) Precaution Comments: no brace needed per orders Restrictions Weight Bearing Restrictions: Yes RUE Weight Bearing: Weight bear through elbow only Other Position/Activity Restrictions: s/p carpal tunnel     Mobility  Bed Mobility Overal bed mobility: Needs Assistance Bed Mobility: Rolling;Sidelying to Sit Rolling: Min guard Sidelying to sit: Min guard       General bed mobility comments: for safety, cues for log roll technique.    Transfers Overall transfer level: Needs assistance Equipment used: Rolling walker (2 wheeled) Transfers: Sit to/from Stand Sit to Stand: Min assist         General transfer comment: light steadying assist once standing, cues for hand  placment  Ambulation/Gait Ambulation/Gait assistance: Supervision Gait Distance (Feet): 170 Feet Assistive device: Rolling walker (2 wheeled) Gait Pattern/deviations: Step-through pattern;Decreased stride length;Trunk flexed Gait velocity: decr   General Gait Details: supervision for safety, cues for placement in RW.   Stairs Stairs: Yes Stairs assistance: Min guard Stair Management: One rail Left;Step to pattern;Forwards Number of Stairs: 2 General stair comments: close guard for safety, cues for sequencing (up with LLE leading, down with RLE leading). Pt led with LLE descending steps with + R knee buckle, pt corrected.   Wheelchair Mobility    Modified Rankin (Stroke Patients Only)       Balance Overall balance assessment: Mild deficits observed, not formally tested                                          Cognition Arousal/Alertness: Awake/alert Behavior During Therapy: WFL for tasks assessed/performed Overall Cognitive Status: Within Functional Limits for tasks assessed                                        Exercises      General Comments General comments (skin integrity, edema, etc.): education and demo provided on all LB AE, education provided on available shower DME with pt verbalizing understanding      Pertinent Vitals/Pain Pain Assessment: Faces Pain Score: 7  Faces Pain Scale: Hurts even more Pain Location: middle of back Pain Descriptors / Indicators: Grimacing;Discomfort Pain Intervention(s): Limited activity within patient's tolerance;Monitored during session;Repositioned    Home Living  Prior Function            PT Goals (current goals can now be found in the care plan section) Acute Rehab PT Goals Patient Stated Goal: home PT Goal Formulation: With patient Time For Goal Achievement: 10/18/20 Potential to Achieve Goals: Good Progress towards PT goals: Progressing toward  goals    Frequency    Min 5X/week      PT Plan Current plan remains appropriate    Co-evaluation              AM-PAC PT "6 Clicks" Mobility   Outcome Measure  Help needed turning from your back to your side while in a flat bed without using bedrails?: A Little Help needed moving from lying on your back to sitting on the side of a flat bed without using bedrails?: A Little Help needed moving to and from a bed to a chair (including a wheelchair)?: A Little Help needed standing up from a chair using your arms (e.g., wheelchair or bedside chair)?: A Little Help needed to walk in hospital room?: A Little Help needed climbing 3-5 steps with a railing? : A Lot 6 Click Score: 17    End of Session   Activity Tolerance: Patient tolerated treatment well Patient left: in chair;with call bell/phone within reach;with chair alarm set Nurse Communication: Mobility status PT Visit Diagnosis: Other abnormalities of gait and mobility (R26.89);Pain Pain - Right/Left:  (low) Pain - part of body:  (back)     Time: 5361-4431 PT Time Calculation (min) (ACUTE ONLY): 18 min  Charges:  $Gait Training: 8-22 mins                    Tim [name], PT DPT Acute Rehabilitation Services Pager [phone]  Office [phone]   Tim [name] 10/05/2020, 10:46 AM

## 2020-10-05 NOTE — Plan of Care (Signed)

## 2020-10-05 NOTE — Progress Notes (Signed)
Occupational Therapy Treatment Patient Details Name: Tim [name] MRN: [id number] DOB: [birth year] Today's Date: 10/05/2020   History of present illness 55 yo male with foot drop back pain and 9/12  s/p L3-S1 TLIF and R carpal tunnel release. PMH anxiety arthritis bipolar depression HLD HTN substance abuse   OT comments  Pt making steady progress towards OT goals this session. Session focus on education related to LB AE for bathing and dressing. Pt able to verbalize back precautions. Demo and education provided on all LB AE available, pt reports having reacher at home for [facility] Dressing as pt unable to figure four. Demo provided on all compensatory methods for maintaining back precautions during ADLs. Pt reports plan to buy TTB for home, provided demo of tub transfer for home with pt verbalizing understanding. Pt would continue to benefit from skilled occupational therapy while admitted and after d/c to address the below listed limitations in order to improve overall functional mobility and facilitate independence with BADL participation. DC plan remains appropriate, will follow acutely per POC.      Recommendations for follow up therapy are one component of a multi-disciplinary discharge planning process, led by the attending physician.  Recommendations may be updated based on patient status, additional functional criteria and insurance authorization.    Follow Up Recommendations  No OT follow up    Equipment Recommendations  None recommended by OT    Recommendations for Other Services      Precautions / Restrictions Precautions Precautions: Back Precaution Booklet Issued: Yes (comment) Precaution Comments: no brace; reviewed adls with back precautions Restrictions Weight Bearing Restrictions: Yes RUE Weight Bearing:  (Weight bear through elbow only) Other Position/Activity Restrictions: s/p carpal tunnel       Mobility Bed Mobility               General bed mobility  comments: pt up in recliner during session    Transfers                 General transfer comment: session conducted from chair level    Balance                                           ADL either performed or assessed with clinical judgement   ADL Overall ADL's : Needs assistance/impaired       Grooming Details (indicate cue type and reason): education provided on compensatory methods for UB grooming tasks in relation to back precautions       Lower Body Bathing Details (indicate cue type and reason): education provided on AE for LB bathing tasks       Lower Body Dressing Details (indicate cue type and reason): demo provided of all LB AE for dressing such as reacher and sock aid, pt reprots understanding and reports recalling how to use AD's from previous surgeries   Toilet Transfer Details (indicate cue type and reason): pt declined transfers   Toileting - Clothing Manipulation Details (indicate cue type and reason): education and demo provided on compensatory methods for posterior pericare in relation to back precautions   Tub/Shower Transfer Details (indicate cue type and reason): demo provided of using 3n1 in tub shower as well as TTB, pt reports he has 3n1 at home but will likely purchase TTB for home, demo'ed placement and transfer for each DME   General ADL Comments: session focus on education related  to LB AE for bathing and dressing and compensatory methods for ADL participation     Vision       Perception     Praxis      Cognition Arousal/Alertness: Awake/alert Behavior During Therapy: WFL for tasks assessed/performed Overall Cognitive Status: Within Functional Limits for tasks assessed                                          Exercises     Shoulder Instructions       General Comments education and demo provided on all LB AE, education provided on available shower DME with pt verbalizing understanding     Pertinent Vitals/ Pain       Pain Assessment: 0-10 Pain Score: 7  Pain Location: middle of back Pain Descriptors / Indicators: Grimacing;Discomfort Pain Intervention(s): Limited activity within patient's tolerance;Monitored during session;Premedicated before session  Home Living                                          Prior Functioning/Environment              Frequency  Min 2X/week        Progress Toward Goals  OT Goals(current goals can now be found in the care plan section)  Progress towards OT goals: Progressing toward goals  Acute Rehab OT Goals Patient Stated Goal: to go home tomorrow OT Goal Formulation: With patient Time For Goal Achievement: 10/18/20 Potential to Achieve Goals: Good  Plan Discharge plan remains appropriate;Frequency remains appropriate    Co-evaluation                 AM-PAC OT "6 Clicks" Daily Activity     Outcome Measure   Help from another person eating meals?: None Help from another person taking care of personal grooming?: A Little Help from another person toileting, which includes using toliet, bedpan, or urinal?: A Little Help from another person bathing (including washing, rinsing, drying)?: A Little Help from another person to put on and taking off regular upper body clothing?: A Little Help from another person to put on and taking off regular lower body clothing?: A Lot 6 Click Score: 18    End of Session    OT Visit Diagnosis: Unsteadiness on feet (R26.81);Muscle weakness (generalized) (M62.81);Pain Pain - part of body:  (back)   Activity Tolerance Patient tolerated treatment well   Patient Left in chair;with call bell/phone within reach   Nurse Communication Mobility status        Time: 1010-1024 OT Time Calculation (min): 14 min  Charges: OT General Charges $OT Visit: 1 Visit OT Treatments $Self Care/Home Management : 8-22 mins  Tim [name]., COTA/L Acute Rehabilitation  Services [phone]   Barron Schmid 10/05/2020, 10:37 AM

## 2020-10-06 LAB — RENAL FUNCTION PANEL
Albumin: 2.6 g/dL — ABNORMAL LOW (ref 3.5–5.0)
Anion gap: 5 (ref 5–15)
BUN: 7 mg/dL (ref 6–20)
CO2: 24 mmol/L (ref 22–32)
Calcium: 8.1 mg/dL — ABNORMAL LOW (ref 8.9–10.3)
Chloride: 106 mmol/L (ref 98–111)
Creatinine, Ser: 0.67 mg/dL (ref 0.61–1.24)
GFR, Estimated: >60 mL/min (ref >60)
Glucose, Bld: 115 mg/dL — ABNORMAL HIGH (ref 70–99)
Phosphorus: 3 mg/dL (ref 2.5–4.6)
Potassium: 3.8 mmol/L (ref 3.5–5.1)
Sodium: 135 mmol/L (ref 135–145)

## 2020-10-06 LAB — CBC
HCT: 26.8 % — ABNORMAL LOW (ref 39.0–52.0)
Hemoglobin: 8.8 g/dL — ABNORMAL LOW (ref 13.0–17.0)
MCH: 29.6 pg (ref 26.0–34.0)
MCHC: 32.8 g/dL (ref 30.0–36.0)
MCV: 90.2 fL (ref 80.0–100.0)
Platelets: 154 10*3/uL (ref 150–400)
RBC: 2.97 MIL/uL — ABNORMAL LOW (ref 4.22–5.81)
RDW: 12.3 % (ref 11.5–15.5)
WBC: 6.1 10*3/uL (ref 4.0–10.5)
nRBC: 0 % (ref 0.0–0.2)

## 2020-10-06 NOTE — Progress Notes (Addendum)
Physical Therapy Treatment Patient Details Name: Tim [name] MRN: [id number] DOB: Jun 09, 1965 Today's Date: 10/06/2020   History of Present Illness 55 yo male with foot drop back pain and 9/12  s/p L3-S1 TLIF and R carpal tunnel release. PMH anxiety arthritis bipolar depression HLD HTN substance abuse    PT Comments    Pt states he has been having nausea, coldsweats, and headaches when upright today. PT assessed orthostatic BP below, negative for orthostatic hypotension. Pt ambulatory around the unit with SL support and supervision for safety only, pt has progressed beyond needing HHPT at d/c. Will continue to follow, would benefit from practicing steps again prior to d/c.    BP: -sitting:139/75 -standing 0 min:132/72 -standing 3 min: 131/72   Recommendations for follow up therapy are one component of a multi-disciplinary discharge planning process, led by the attending physician.  Recommendations may be updated based on patient status, additional functional criteria and insurance authorization.  Follow Up Recommendations  Supervision for mobility/OOB;Follow surgeon's recommendation for DC plan and follow-up therapies (progressed beyond needing HHPT, may benefit from OPPT when cleared by MD at follow up)     Equipment Recommendations  None recommended by PT    Recommendations for Other Services       Precautions / Restrictions Precautions Precautions: Back Precaution Booklet Issued: Yes (comment) Precaution Comments: no brace needed per orders Restrictions RUE Weight Bearing: Weight bear through elbow only Other Position/Activity Restrictions: s/p carpal tunnel     Mobility  Bed Mobility Overal bed mobility: Needs Assistance Bed Mobility: Sit to Sidelying Rolling: Min guard Sidelying to sit: Min guard     Sit to sidelying: Min guard General bed mobility comments: for safety, increased time and needed use of bedrail.    Transfers Overall transfer level: Needs  assistance Equipment used: None Transfers: Sit to/from Stand Sit to Stand: Min guard         General transfer comment: for safety, pt reaching for IV pole to steady self once standing.  Ambulation/Gait Ambulation/Gait assistance: Supervision Gait Distance (Feet): 200 Feet Assistive device: IV Pole Gait Pattern/deviations: Step-through pattern;Decreased stride length;Trunk flexed Gait velocity: decr   General Gait Details: for safety only, no evidence of unsteadiness with SL support of IV pole. + limp on RLE, chronic hip pain and THA.   Stairs             Wheelchair Mobility    Modified Rankin (Stroke Patients Only)       Balance Overall balance assessment: Mild deficits observed, not formally tested                                          Cognition Arousal/Alertness: Awake/alert Behavior During Therapy: WFL for tasks assessed/performed Overall Cognitive Status: Within Functional Limits for tasks assessed                                        Exercises      General Comments        Pertinent Vitals/Pain Pain Assessment: Faces Faces Pain Scale: Hurts little more Pain Location: back Pain Descriptors / Indicators: Grimacing;Discomfort Pain Intervention(s): Limited activity within patient's tolerance;Monitored during session;Repositioned    Home Living  Prior Function            PT Goals (current goals can now be found in the care plan section) Acute Rehab PT Goals Patient Stated Goal: home PT Goal Formulation: With patient Time For Goal Achievement: 10/18/20 Potential to Achieve Goals: Good Progress towards PT goals: Progressing toward goals    Frequency    Min 5X/week      PT Plan Current plan remains appropriate    Co-evaluation              AM-PAC PT "6 Clicks" Mobility   Outcome Measure  Help needed turning from your back to your side while in a flat bed  without using bedrails?: A Little Help needed moving from lying on your back to sitting on the side of a flat bed without using bedrails?: A Little Help needed moving to and from a bed to a chair (including a wheelchair)?: A Little Help needed standing up from a chair using your arms (e.g., wheelchair or bedside chair)?: A Little Help needed to walk in hospital room?: A Little Help needed climbing 3-5 steps with a railing? : A Little 6 Click Score: 18    End of Session   Activity Tolerance: Patient tolerated treatment well Patient left: in chair;with call bell/phone within reach;with chair alarm set Nurse Communication: Mobility status PT Visit Diagnosis: Other abnormalities of gait and mobility (R26.89);Pain Pain - Right/Left:  (low) Pain - part of body:  (back)     Time: 9767-3419 PT Time Calculation (min) (ACUTE ONLY): 19 min  Charges:  $Gait Training: 8-22 mins                     Marye Round, PT DPT Acute Rehabilitation Services Pager [phone]  Office [phone]     [name] 10/06/2020, 2:57 PM

## 2020-10-06 NOTE — Plan of Care (Signed)

## 2020-10-06 NOTE — Progress Notes (Addendum)
Neurosurgery Service Progress Note  Subjective: No acute events overnight, pain control improving, now just significant when he is up and moving around, no radicular pain  Objective: Vitals:   10/05/20 2013 10/05/20 2338 10/06/20 0309 10/06/20 0747  BP: 108/65 120/67 123/62 109/68  Pulse:  75 81 74  Resp: 16   18  Temp:  98.9 F (37.2 C) 98.7 F (37.1 C) 98.7 F (37.1 C)  TempSrc:  Oral Oral Oral  SpO2:  97% 99% 96%  Weight:      Height:        Physical Exam: Strength 5/5 x4 except improving L FD - today 4-/5 in L EHL and TA  R hand in CTR dressing     Assessment & Plan: 55 y.o. man s/p 3 level open TLIF, R CTR. Post-op with mild AKI resolved w/ fluids, high drain output  -drain output downtrending but still too high to d/c drain, continue drain, daily CBCs/RFPs, likely d/c MIVF tomorrow -Hb downtrending as expected w/ drain output, 8.8 so still above transfusion threshold   Jadene Pierini  10/06/20 9:11 AM

## 2020-10-07 LAB — CBC
HCT: 26.2 % — ABNORMAL LOW (ref 39.0–52.0)
Hemoglobin: 8.7 g/dL — ABNORMAL LOW (ref 13.0–17.0)
MCH: 29.6 pg (ref 26.0–34.0)
MCHC: 33.2 g/dL (ref 30.0–36.0)
MCV: 89.1 fL (ref 80.0–100.0)
Platelets: 166 10*3/uL (ref 150–400)
RBC: 2.94 MIL/uL — ABNORMAL LOW (ref 4.22–5.81)
RDW: 12.1 % (ref 11.5–15.5)
WBC: 7.3 10*3/uL (ref 4.0–10.5)
nRBC: 0 % (ref 0.0–0.2)

## 2020-10-07 LAB — RENAL FUNCTION PANEL
Albumin: 2.5 g/dL — ABNORMAL LOW (ref 3.5–5.0)
Anion gap: 10 (ref 5–15)
BUN: 7 mg/dL (ref 6–20)
CO2: 23 mmol/L (ref 22–32)
Calcium: 8.4 mg/dL — ABNORMAL LOW (ref 8.9–10.3)
Chloride: 105 mmol/L (ref 98–111)
Creatinine, Ser: 0.67 mg/dL (ref 0.61–1.24)
GFR, Estimated: >60 mL/min (ref >60)
Glucose, Bld: 122 mg/dL — ABNORMAL HIGH (ref 70–99)
Phosphorus: 2.7 mg/dL (ref 2.5–4.6)
Potassium: 3.6 mmol/L (ref 3.5–5.1)
Sodium: 138 mmol/L (ref 135–145)

## 2020-10-07 NOTE — Discharge Summary (Signed)
Discharge Summary  Date of Admission: 10/03/2020  Date of Discharge: 10/07/20  Attending Physician: Autumn Patty, MD  Hospital Course: Patient was admitted following an uncomplicated L3-S1 open TLIF and R CTR. He was recovered in PACU and transferred to 4NP. His hospital course was notable for a one time bump in creatinine that resolved with IV fluids and high post-op drain output. This downtrended and his drain was able to be discontinued on 9/17. His foot drop improved some compared to preop, his hospital course was otherwise uncomplicated and the patient was discharged home on 10/08/20. He will follow up in clinic with me in 2 weeks.  Neurologic exam at discharge:  Strength 5/5 x4 except left EHL/TA 3/5, SILTx4 except chronic L L5 numbness  Discharge diagnosis: Lumbar radiculopathy, carpal tunnel syndrome  Jadene Pierini, MD 10/07/20 10:31 AM

## 2020-10-07 NOTE — Progress Notes (Signed)
Physical Therapy Treatment Patient Details Name: Tim [name] MRN: [id number] DOB: [birth year] Today's Date: 10/07/2020   History of Present Illness 55 yo male with foot drop back pain and 9/12  s/p L3-S1 TLIF and R carpal tunnel release. PMH anxiety arthritis bipolar depression HLD HTN substance abuse    PT Comments    Patient ambulating at supervision level with no AD. Patient performed stair training with supervision and no LOB noted this session. Reviewed back precautions and patient able to recall 3/3 precautions. D/c plan remains appropriate.     Recommendations for follow up therapy are one component of a multi-disciplinary discharge planning process, led by the attending physician.  Recommendations may be updated based on patient status, additional functional criteria and insurance authorization.  Follow Up Recommendations  Follow surgeon's recommendation for DC plan and follow-up therapies;Supervision for mobility/OOB (progressed beyond needing HHPT, may benefit from OPPT when cleared by MD at follow up)     Equipment Recommendations  None recommended by PT    Recommendations for Other Services       Precautions / Restrictions Precautions Precautions: Back Precaution Booklet Issued: Yes (comment) Precaution Comments: no brace needed per orders Restrictions Weight Bearing Restrictions: No     Mobility  Bed Mobility Overal bed mobility: Needs Assistance Bed Mobility: Rolling;Sidelying to Sit Rolling: Supervision Sidelying to sit: Supervision       General bed mobility comments: supervision for safety. Good recall of log roll techniques. Requires use of bed rails to complete    Transfers Overall transfer level: Needs assistance Equipment used: None Transfers: Sit to/from Stand Sit to Stand: Supervision         General transfer comment: supervision for safety  Ambulation/Gait Ambulation/Gait assistance: Supervision Gait Distance (Feet): 400  Feet Assistive device: None Gait Pattern/deviations: Step-through pattern;Decreased stride length Gait velocity: decr   General Gait Details: supervision for safety, slow steady pace. decreased stance time on the R LE due to chronic hip pain/THA   Stairs Stairs: Yes Stairs assistance: Supervision Stair Management: One rail Left;Step to pattern;Forwards Number of Stairs: 3 General stair comments: supervision for safety. Leading with L on ascent and leading with R on descent   Wheelchair Mobility    Modified Rankin (Stroke Patients Only)       Balance Overall balance assessment: Mild deficits observed, not formally tested                                          Cognition Arousal/Alertness: Awake/alert Behavior During Therapy: WFL for tasks assessed/performed Overall Cognitive Status: Within Functional Limits for tasks assessed                                        Exercises      General Comments        Pertinent Vitals/Pain Pain Assessment: Faces Faces Pain Scale: Hurts little more Pain Location: back Pain Descriptors / Indicators: Grimacing;Discomfort Pain Intervention(s): Monitored during session;Repositioned    Home Living                      Prior Function            PT Goals (current goals can now be found in the care plan section) Acute Rehab PT Goals Patient Stated Goal: home  PT Goal Formulation: With patient Time For Goal Achievement: 10/18/20 Potential to Achieve Goals: Good Progress towards PT goals: Progressing toward goals    Frequency    Min 5X/week      PT Plan Current plan remains appropriate    Co-evaluation              AM-PAC PT "6 Clicks" Mobility   Outcome Measure  Help needed turning from your back to your side while in a flat bed without using bedrails?: A Little Help needed moving from lying on your back to sitting on the side of a flat bed without using bedrails?: A  Little Help needed moving to and from a bed to a chair (including a wheelchair)?: A Little Help needed standing up from a chair using your arms (e.g., wheelchair or bedside chair)?: A Little Help needed to walk in hospital room?: A Little Help needed climbing 3-5 steps with a railing? : A Little 6 Click Score: 18    End of Session   Activity Tolerance: Patient tolerated treatment well Patient left: in chair;with call bell/phone within reach Nurse Communication: Mobility status PT Visit Diagnosis: Other abnormalities of gait and mobility (R26.89);Pain     Time: 3762-8315 PT Time Calculation (min) (ACUTE ONLY): 13 min  Charges:  $Gait Training: 8-22 mins                     Mkayla Steele A. Dan Humphreys PT, DPT Acute Rehabilitation Services Pager [phone] Office [phone]    Viviann Spare 10/07/2020, 4:44 PM

## 2020-10-08 LAB — RENAL FUNCTION PANEL
Albumin: 2.6 g/dL — ABNORMAL LOW (ref 3.5–5.0)
Anion gap: 5 (ref 5–15)
BUN: 10 mg/dL (ref 6–20)
CO2: 25 mmol/L (ref 22–32)
Calcium: 8.6 mg/dL — ABNORMAL LOW (ref 8.9–10.3)
Chloride: 109 mmol/L (ref 98–111)
Creatinine, Ser: 0.68 mg/dL (ref 0.61–1.24)
GFR, Estimated: >60 mL/min (ref >60)
Glucose, Bld: 118 mg/dL — ABNORMAL HIGH (ref 70–99)
Phosphorus: 3.9 mg/dL (ref 2.5–4.6)
Potassium: 3.3 mmol/L — ABNORMAL LOW (ref 3.5–5.1)
Sodium: 139 mmol/L (ref 135–145)

## 2020-10-08 LAB — CBC
HCT: 27 % — ABNORMAL LOW (ref 39.0–52.0)
Hemoglobin: 8.9 g/dL — ABNORMAL LOW (ref 13.0–17.0)
MCH: 29.5 pg (ref 26.0–34.0)
MCHC: 33 g/dL (ref 30.0–36.0)
MCV: 89.4 fL (ref 80.0–100.0)
Platelets: 233 10*3/uL (ref 150–400)
RBC: 3.02 MIL/uL — ABNORMAL LOW (ref 4.22–5.81)
RDW: 12.2 % (ref 11.5–15.5)
WBC: 6.5 10*3/uL (ref 4.0–10.5)
nRBC: 0 % (ref 0.0–0.2)

## 2020-10-08 MED ORDER — CYCLOBENZAPRINE HCL 10 MG PO TABS: 10.0000 mg | ORAL_TABLET | Freq: Three times a day (TID) | ORAL | 0 refills | Status: AC | PRN

## 2020-10-08 NOTE — Progress Notes (Signed)
Pt discharged home by Dr. Wynetta Emery. JP drain and PIV removed without incidence. Pt belongings packed, pt educated on discharge information with no questions asked. Pt taken to private vehicle in wheelchair by this RN.   Robina Ade, RN

## 2020-10-08 NOTE — Discharge Summary (Signed)
Physician Discharge Summary  Patient ID: Tim [name] MRN: [id number] DOB/AGE: Dec 12, 1965 55 y.o. Estimated body mass index is 30.54 kg/m as calculated from the following:   Height as of this encounter: 5\' 11"  (1.803 m).   Weight as of this encounter: 99.3 kg.   Admit date: 10/03/2020 Discharge date: 10/08/2020  Admission Diagnoses: Lumbar spinal stenosis and neurogenic claudication L3-S1  Discharge Diagnoses: Same Active Problems:   Spondylolisthesis of lumbar region   Discharged Condition: good  Hospital Course: Patient was admitted underwent decompressive laminotomies and 3 level interbody fusions did very well postoperatively had some increased drain output was observed in the hospital for couple additional days but is stable for discharge home now scheduled follow-up in 1 to 2 weeks with Dr. 10/10/2020.  Consults: Significant Diagnostic Studies: Treatments: L3-4, L4-5, L5-S1 transforaminal lumbar interbody fusions Discharge Exam: Blood pressure 125/66, pulse 70, temperature 98 F (36.7 C), resp. rate 20, height 5\' 11"  (1.803 m), weight 99.3 kg, SpO2 99 %. Strength out of 5 wound clean dry and intact drain output less than 50 cc overnight  Disposition: Home   Allergies as of 10/08/2020   No Known Allergies      Medication List     TAKE these medications    acetaminophen 500 MG tablet Commonly known as: TYLENOL Take 1,500 mg by mouth in the morning, at noon, and at bedtime.   atorvastatin 40 MG tablet Commonly known as: Lipitor Take 1 tablet (40 mg total) by mouth daily. What changed: when to take this   cyclobenzaprine 10 MG tablet Commonly known as: FLEXERIL Take 1 tablet (10 mg total) by mouth 3 (three) times daily as needed for muscle spasms.   diclofenac 75 MG EC tablet Commonly known as: VOLTAREN Take 75 mg by mouth 2 (two) times daily.   Fish Oil 1000 MG Caps Take 1,000 mg by mouth in the morning, at noon, and at bedtime.   lisinopril 5 MG  tablet Commonly known as: ZESTRIL Take 5 mg by mouth daily.   magnesium oxide 400 MG tablet Commonly known as: MAG-OX Take 400 mg by mouth at bedtime.   Melatonin 10 MG Tabs Take 10 mg by mouth at bedtime.   Potassium 99 MG Tabs Take 99 mg by mouth in the morning.   QUEtiapine 50 MG tablet Commonly known as: SEROQUEL Take 150 mg by mouth at bedtime.         Signed: 10/08/2020, 8:06 AM

## 2021-01-19 ENCOUNTER — Other Ambulatory Visit: Payer: Self-pay

## 2021-01-19 ENCOUNTER — Ambulatory Visit
Admission: RE | Admit: 2021-01-19 | Discharge: 2021-01-19 | Disposition: A | Discharge status: Home / Self Care | Payer: 59 | Source: Ambulatory Visit | Attending: Emergency Medicine | Admitting: Emergency Medicine

## 2021-01-19 VITALS — BP 162/89 | HR 104 | Temp 98.6°F | Resp 18

## 2021-01-19 DIAGNOSIS — T1490XA Injury, unspecified, initial encounter: Secondary | ICD-10-CM

## 2021-01-19 DIAGNOSIS — J705 Respiratory conditions due to smoke inhalation: Secondary | ICD-10-CM | POA: Diagnosis not present

## 2021-01-19 DIAGNOSIS — J029 Acute pharyngitis, unspecified: Secondary | ICD-10-CM

## 2021-01-19 MED ORDER — PREDNISONE 20 MG PO TABS: 40.0000 mg | ORAL_TABLET | Freq: Every day | ORAL | 0 refills | Status: AC
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
## 2021-01-19 NOTE — Discharge Instructions (Addendum)
For the prednisone, continue the diclofenac.  Try mixing 5 mL of liquid Benadryl with 5 mL of Maalox together, gargle and swallow.  The Benadryl will help with inflammation and pain, and the Maalox will coat your throat.  I would continue liquid/pured/soft diet.  Please follow-up with your doctor in several days, with GI if you are not getting any better, go to the ED if you get worse.

## 2021-02-06 DIAGNOSIS — R69 Illness, unspecified: Secondary | ICD-10-CM | POA: Diagnosis not present

## 2021-02-06 DIAGNOSIS — I10 Essential (primary) hypertension: Secondary | ICD-10-CM | POA: Diagnosis not present

## 2021-02-06 DIAGNOSIS — E782 Mixed hyperlipidemia: Secondary | ICD-10-CM | POA: Diagnosis not present

## 2021-02-24 DIAGNOSIS — Z6833 Body mass index (BMI) 33.0-33.9, adult: Secondary | ICD-10-CM | POA: Diagnosis not present

## 2021-02-24 DIAGNOSIS — I10 Essential (primary) hypertension: Secondary | ICD-10-CM | POA: Diagnosis not present

## 2021-02-24 DIAGNOSIS — M48062 Spinal stenosis, lumbar region with neurogenic claudication: Secondary | ICD-10-CM | POA: Diagnosis not present

## 2021-03-03 ENCOUNTER — Encounter (HOSPITAL_COMMUNITY)
Admission: EM | Disposition: A | Discharge status: Home / Self Care | Payer: Self-pay | Source: Home / Self Care | Attending: Emergency Medicine

## 2021-03-03 ENCOUNTER — Ambulatory Visit (HOSPITAL_COMMUNITY)
Admission: EM | Admit: 2021-03-03 | Discharge: 2021-03-03 | Disposition: A | Discharge status: Home / Self Care | Payer: 59 | Attending: Emergency Medicine | Admitting: Emergency Medicine

## 2021-03-03 ENCOUNTER — Emergency Department (HOSPITAL_COMMUNITY): Payer: 59 | Admitting: Anesthesiology

## 2021-03-03 ENCOUNTER — Emergency Department (HOSPITAL_COMMUNITY): Payer: 59

## 2021-03-03 ENCOUNTER — Encounter (HOSPITAL_COMMUNITY): Payer: Self-pay | Admitting: Anesthesiology

## 2021-03-03 ENCOUNTER — Other Ambulatory Visit (INDEPENDENT_AMBULATORY_CARE_PROVIDER_SITE_OTHER): Payer: Self-pay | Admitting: Gastroenterology

## 2021-03-03 ENCOUNTER — Other Ambulatory Visit: Payer: Self-pay

## 2021-03-03 DIAGNOSIS — T18108A Unspecified foreign body in esophagus causing other injury, initial encounter: Secondary | ICD-10-CM | POA: Diagnosis not present

## 2021-03-03 DIAGNOSIS — E669 Obesity, unspecified: Secondary | ICD-10-CM | POA: Diagnosis not present

## 2021-03-03 DIAGNOSIS — K209 Esophagitis, unspecified without bleeding: Secondary | ICD-10-CM

## 2021-03-03 DIAGNOSIS — F419 Anxiety disorder, unspecified: Secondary | ICD-10-CM | POA: Insufficient documentation

## 2021-03-03 DIAGNOSIS — R000 Tachycardia, unspecified: Secondary | ICD-10-CM | POA: Diagnosis not present

## 2021-03-03 DIAGNOSIS — X58XXXA Exposure to other specified factors, initial encounter: Secondary | ICD-10-CM | POA: Insufficient documentation

## 2021-03-03 DIAGNOSIS — I10 Essential (primary) hypertension: Secondary | ICD-10-CM | POA: Diagnosis not present

## 2021-03-03 DIAGNOSIS — Z6830 Body mass index (BMI) 30.0-30.9, adult: Secondary | ICD-10-CM | POA: Insufficient documentation

## 2021-03-03 DIAGNOSIS — F319 Bipolar disorder, unspecified: Secondary | ICD-10-CM | POA: Insufficient documentation

## 2021-03-03 DIAGNOSIS — T18128A Food in esophagus causing other injury, initial encounter: Secondary | ICD-10-CM | POA: Diagnosis not present

## 2021-03-03 DIAGNOSIS — E785 Hyperlipidemia, unspecified: Secondary | ICD-10-CM | POA: Diagnosis not present

## 2021-03-03 DIAGNOSIS — Z20822 Contact with and (suspected) exposure to covid-19: Secondary | ICD-10-CM | POA: Insufficient documentation

## 2021-03-03 DIAGNOSIS — R1310 Dysphagia, unspecified: Secondary | ICD-10-CM | POA: Diagnosis not present

## 2021-03-03 DIAGNOSIS — Z87891 Personal history of nicotine dependence: Secondary | ICD-10-CM | POA: Diagnosis not present

## 2021-03-03 DIAGNOSIS — R69 Illness, unspecified: Secondary | ICD-10-CM | POA: Diagnosis not present

## 2021-03-03 DIAGNOSIS — R918 Other nonspecific abnormal finding of lung field: Secondary | ICD-10-CM | POA: Diagnosis not present

## 2021-03-03 HISTORY — PX: ESOPHAGOGASTRODUODENOSCOPY (EGD) WITH PROPOFOL: SHX5813

## 2021-03-03 LAB — CBC WITH DIFFERENTIAL/PLATELET
Abs Immature Granulocytes: 0.06 10*3/uL (ref 0.00–0.07)
Basophils Absolute: 0 10*3/uL (ref 0.0–0.1)
Basophils Relative: 0 %
Eosinophils Absolute: 0 10*3/uL (ref 0.0–0.5)
Eosinophils Relative: 0 %
HCT: 47.4 % (ref 39.0–52.0)
Hemoglobin: 15.9 g/dL (ref 13.0–17.0)
Immature Granulocytes: 1 %
Lymphocytes Relative: 24 %
Lymphs Abs: 2.8 10*3/uL (ref 0.7–4.0)
MCH: 29.8 pg (ref 26.0–34.0)
MCHC: 33.5 g/dL (ref 30.0–36.0)
MCV: 88.9 fL (ref 80.0–100.0)
Monocytes Absolute: 0.7 10*3/uL (ref 0.1–1.0)
Monocytes Relative: 6 %
Neutro Abs: 7.8 10*3/uL — ABNORMAL HIGH (ref 1.7–7.7)
Neutrophils Relative %: 69 %
Platelets: 244 10*3/uL (ref 150–400)
RBC: 5.33 MIL/uL (ref 4.22–5.81)
RDW: 13.2 % (ref 11.5–15.5)
WBC: 11.4 10*3/uL — ABNORMAL HIGH (ref 4.0–10.5)
nRBC: 0 % (ref 0.0–0.2)

## 2021-03-03 LAB — BASIC METABOLIC PANEL
Anion gap: 11 (ref 5–15)
BUN: 20 mg/dL (ref 6–20)
CO2: 22 mmol/L (ref 22–32)
Calcium: 9.7 mg/dL (ref 8.9–10.3)
Chloride: 106 mmol/L (ref 98–111)
Creatinine, Ser: 0.88 mg/dL (ref 0.61–1.24)
GFR, Estimated: >60 mL/min (ref >60)
Glucose, Bld: 92 mg/dL (ref 70–99)
Potassium: 3.5 mmol/L (ref 3.5–5.1)
Sodium: 139 mmol/L (ref 135–145)

## 2021-03-03 LAB — TYPE AND SCREEN
ABO/RH(D): B POS
Antibody Screen: NEGATIVE

## 2021-03-03 LAB — RESP PANEL BY RT-PCR (FLU A&B, COVID) ARPGX2
Influenza A by PCR: NEGATIVE
Influenza B by PCR: NEGATIVE
SARS Coronavirus 2 by RT PCR: NEGATIVE

## 2021-03-03 SURGERY — ESOPHAGOGASTRODUODENOSCOPY (EGD) WITH PROPOFOL
Anesthesia: General

## 2021-03-03 MED ORDER — PROPOFOL 10 MG/ML IV BOLUS
INTRAVENOUS | Status: AC
  Filled 2021-03-03: qty 20

## 2021-03-03 MED ORDER — DEXMEDETOMIDINE (PRECEDEX) IN NS 20 MCG/5ML (4 MCG/ML) IV SYRINGE
PREFILLED_SYRINGE | INTRAVENOUS | Status: DC | PRN
  Administered 2021-03-03: 4 ug via INTRAVENOUS
  Administered 2021-03-03: 16 ug via INTRAVENOUS

## 2021-03-03 MED ORDER — PROPOFOL 10 MG/ML IV BOLUS
INTRAVENOUS | Status: DC | PRN
  Administered 2021-03-03: 400 mg via INTRAVENOUS

## 2021-03-03 MED ORDER — LACTATED RINGERS IV SOLN: INTRAVENOUS | Status: DC | PRN

## 2021-03-03 MED ORDER — DEXMEDETOMIDINE (PRECEDEX) IN NS 20 MCG/5ML (4 MCG/ML) IV SYRINGE
PREFILLED_SYRINGE | INTRAVENOUS | Status: AC
  Filled 2021-03-03: qty 5

## 2021-03-03 MED ORDER — FENTANYL CITRATE (PF) 100 MCG/2ML IJ SOLN
INTRAMUSCULAR | Status: AC
  Filled 2021-03-03: qty 2

## 2021-03-03 MED ORDER — FENTANYL CITRATE (PF) 100 MCG/2ML IJ SOLN
INTRAMUSCULAR | Status: DC | PRN
Start: 2021-03-03 — End: 2021-03-03
  Administered 2021-03-03: 100 ug via INTRAVENOUS

## 2021-03-03 MED ORDER — SUCCINYLCHOLINE CHLORIDE 200 MG/10ML IV SOSY
PREFILLED_SYRINGE | INTRAVENOUS | Status: DC | PRN
  Administered 2021-03-03: 100 mg via INTRAVENOUS

## 2021-03-03 MED ORDER — PROPOFOL 1000 MG/100ML IV EMUL
INTRAVENOUS | Status: AC
  Filled 2021-03-03: qty 100

## 2021-03-03 MED ORDER — OMEPRAZOLE 40 MG PO CPDR: 40.0000 mg | DELAYED_RELEASE_CAPSULE | Freq: Every day | ORAL | 0 refills | Status: AC

## 2021-03-03 NOTE — ED Triage Notes (Signed)
Pt reports that he choked on short ribs yesterday. Pt reports that everything he swallows comes back up. Pt is anxious in triage.

## 2021-03-03 NOTE — Anesthesia Postprocedure Evaluation (Signed)
Anesthesia Post Note  Patient: Tim [name]  Procedure(s) Performed: ESOPHAGOGASTRODUODENOSCOPY (EGD) WITH PROPOFOL  Patient location during evaluation: PACU Anesthesia Type: General Level of consciousness: lethargic Pain management: pain level controlled Vital Signs Assessment: post-procedure vital signs reviewed and stable Respiratory status: spontaneous breathing, nonlabored ventilation, respiratory function stable and patient connected to nasal cannula oxygen Cardiovascular status: blood pressure returned to baseline and stable Postop Assessment: no apparent nausea or vomiting Anesthetic complications: no   No notable events documented.   Last Vitals:  Vitals:   03/03/21 1800 03/03/21 1937  BP: (!) 163/97 (!) 148/100  Pulse: (!) 104   Resp: 19 14  Temp:    SpO2: 99% (!) 72%    Last Pain: There were no vitals filed for this visit.               Windell Norfolk

## 2021-03-03 NOTE — Consult Note (Signed)
Maylon Peppers, M.D. Gastroenterology & Hepatology                                           Patient Name: Tim [name] Account #: $[email or web address] @   MRN: [id number] Admission Date: 03/03/2021 Date of Evaluation:  03/03/2021 Time of Evaluation: 6:27 PM   Referring Physician: Noemi Chapel, MD  Chief Complaint:  Food impaction  HPI:  This is a 56 y.o. male with history of anxiety, arthritis, bipolar disorder, depression, hyperlipidemia, hypertension, history of drug and alcohol abuse, who comes to the hospital for evaluation after presenting an episode of food impaction. The patient reports he was eating short ribs yesterday at 2 PM and he felt the food got stuck in the retrosternal area.  He has not been able to swallow since then and has been constantly spitting his saliva and has not been able to swallow.  States that he feels persistent discomfort in his midsternal area.  Drooling: Yes Able to swallow food: No Able to drink liquids"no Previous reflux symptoms: Used to have frequent heartburn many years ago but he quit drinking 10 years ago and this led to significant weight loss, heartburn has not been present since then.  Does not take any PPI. Weight changes: No Seasonal allegies: As a child but not anymore  Previous episodes: Patient reports that at least for the last 10 years he has presented intermittent episodes of dysphagia to solids, but has noticed that recently he has been having more frequent episodes with smaller sized food  Previous EGD: Never   In the ER, his vital signs were showing tachycardia otherwise stable.  COVID testing was performed.  Past Medical History: SEE CHRONIC ISSSUES: Past Medical History:  Diagnosis Date   Anxiety    Arthritis    Bipolar disorder ([place])    Depression    Hyperlipidemia    Hypertension    Substance abuse ([place])    recovered - alcohol 2015, drugs 2012   Past Surgical History:  Past Surgical History:  Procedure Laterality Date    BACK SURGERY  2012   CARPAL TUNNEL RELEASE Left    CARPAL TUNNEL RELEASE Right 10/03/2020   Procedure: RIGHT CARPAL TUNNEL RELEASE;  Surgeon: Judith Part, MD;  Location: [place];  Service: Neurosurgery;  Laterality: Right;   EXCISION MASS UPPER EXTREMETIES Left 08/22/2017   Procedure: LEFT THUMB EXCISION MASS NAILBED WITH DEBRIDEMENT OF INTERPHALANGEAL JOINT;  Surgeon: Daryll Brod, MD;  Location: [place];  Service: Orthopedics;  Laterality: Left;   FRACTURE SURGERY     right hand   HAND SURGERY  1988   SPINE SURGERY     , and lumbar decompression   TONSILLECTOMY AND ADENOIDECTOMY     TOTAL HIP ARTHROPLASTY Right 04/03/2019   Procedure: RIGHT TOTAL HIP ARTHROPLASTY ANTERIOR APPROACH;  Surgeon: Mcarthur Rossetti, MD;  Location: [facility];  Service: Orthopedics;  Laterality: Right;   TRANSFORAMINAL LUMBAR INTERBODY FUSION (TLIF) WITH PEDICLE SCREW FIXATION 3 LEVEL N/A 10/03/2020   Procedure: Lumbar three-four, Lumbar four-five, Lumbar five-Sacral one open decompression with transforaminal lumbar interbody fusion, posterolateral instrumented fusion;  Surgeon: Judith Part, MD;  Location: [facility];  Service: Neurosurgery;  Laterality: N/A;   Family History:  Family History  Problem Relation Age of Onset   Arthritis Mother    Cancer Father        barretts  esophagus   Depression Father    Diabetes Father    Hyperlipidemia Father    Hypertension Father    Stroke Maternal Grandmother    Heart disease Maternal Grandfather 59   Dementia Paternal Grandmother    Diabetes Paternal Grandfather    Depression Maternal Aunt    Alcohol abuse Maternal Aunt    Alcohol abuse Maternal Uncle    Drug abuse Paternal Uncle    Social History:  Social History   Tobacco Use   Smoking status: Former    Types: Cigarettes    Quit date: 04/23/2011    Years since quitting: 9.8   Smokeless tobacco: Never   Tobacco comments:    quit smoking 6 years ago  Vaping Use   Vaping Use:  Never used  Substance Use Topics   Alcohol use: Not Currently    Comment: occasionally/ been through rehab   Drug use: Not Currently    Types: "Crack" cocaine    Comment: relapse w/ crack 11/2010; as of 08/05/20, pt reports last use of cocaine was 04/2011    Home Medications:  Prior to Admission medications   Medication Sig Start Date End Date Taking? Authorizing Provider  acetaminophen (TYLENOL) 500 MG tablet Take 1,500 mg by mouth in the morning, at noon, and at bedtime.   Yes [provider]  atorvastatin (LIPITOR) 40 MG tablet Take 1 tablet (40 mg total) by mouth daily. Patient taking differently: Take 40 mg by mouth every evening. 08/30/16  Yes Raylene Everts, MD  cyclobenzaprine (FLEXERIL) 10 MG tablet Take 1 tablet (10 mg total) by mouth 3 (three) times daily as needed for muscle spasms. 10/08/20  Yes Kary Kos, MD  diclofenac (VOLTAREN) 75 MG EC tablet Take 75 mg by mouth 2 (two) times daily.  01/26/19  Yes [provider]  lisinopril (ZESTRIL) 5 MG tablet Take 5 mg by mouth daily.   Yes [provider]  magnesium oxide (MAG-OX) 400 MG tablet Take 400 mg by mouth at bedtime.   Yes [provider]  Melatonin 10 MG TABS Take 10 mg by mouth at bedtime.    Yes [provider]  Omega-3 Fatty Acids (FISH OIL) 1000 MG CAPS Take 1,000 mg by mouth in the morning, at noon, and at bedtime.   Yes [provider]  Potassium 99 MG TABS Take 99 mg by mouth in the morning.   Yes [provider]  traMADol (ULTRAM) 50 MG tablet Take 50 mg by mouth every 6 (six) hours as needed.   Yes [provider]  QUEtiapine (SEROQUEL) 50 MG tablet Take 150 mg by mouth at bedtime.    [provider]    Inpatient Medications: No current facility-administered medications for this encounter.  Current Outpatient Medications:    acetaminophen (TYLENOL) 500 MG tablet, Take 1,500 mg by mouth in the morning, at noon, and at bedtime., Disp: ,  Rfl:    atorvastatin (LIPITOR) 40 MG tablet, Take 1 tablet (40 mg total) by mouth daily. (Patient taking differently: Take 40 mg by mouth every evening.), Disp: 90 tablet, Rfl: 3   cyclobenzaprine (FLEXERIL) 10 MG tablet, Take 1 tablet (10 mg total) by mouth 3 (three) times daily as needed for muscle spasms., Disp: 30 tablet, Rfl: 0   diclofenac (VOLTAREN) 75 MG EC tablet, Take 75 mg by mouth 2 (two) times daily. , Disp: , Rfl:    lisinopril (ZESTRIL) 5 MG tablet, Take 5 mg by mouth daily., Disp: , Rfl:  magnesium oxide (MAG-OX) 400 MG tablet, Take 400 mg by mouth at bedtime., Disp: , Rfl:    Melatonin 10 MG TABS, Take 10 mg by mouth at bedtime. , Disp: , Rfl:    Omega-3 Fatty Acids (FISH OIL) 1000 MG CAPS, Take 1,000 mg by mouth in the morning, at noon, and at bedtime., Disp: , Rfl:    Potassium 99 MG TABS, Take 99 mg by mouth in the morning., Disp: , Rfl:    traMADol (ULTRAM) 50 MG tablet, Take 50 mg by mouth every 6 (six) hours as needed., Disp: , Rfl:    QUEtiapine (SEROQUEL) 50 MG tablet, Take 150 mg by mouth at bedtime., Disp: , Rfl:  Allergies: Patient has no known allergies.  Complete Review of Systems: GENERAL: negative for malaise, night sweats HEENT: No changes in hearing or vision, no nose bleeds or other nasal problems. NECK: Negative for lumps, goiter, pain and significant neck swelling RESPIRATORY: Negative for cough, wheezing CARDIOVASCULAR: Negative for chest pain, leg swelling, palpitations, orthopnea GI: SEE HPI MUSCULOSKELETAL: Negative for joint pain or swelling, back pain, and muscle pain. SKIN: Negative for lesions, rash PSYCH: Negative for sleep disturbance, mood disorder and recent psychosocial stressors. HEMATOLOGY Negative for prolonged bleeding, bruising easily, and swollen nodes. ENDOCRINE: Negative for cold or heat intolerance, polyuria, polydipsia and goiter. NEURO: negative for tremor, gait imbalance, syncope and seizures. The remainder of the review of  systems is noncontributory.  Physical Exam: BP (!) 172/98 (BP Location: Right Arm)    Pulse (!) 102    Temp 98.6 F (37 C)    Resp 20    Ht $R'5\' 11"'gi$  (1.803 m)    Wt 99.3 kg    SpO2 100%    BMI 30.53 kg/m  GENERAL: The patient is AO x3, in no acute distress. Obese. HEENT: Head is normocephalic and atraumatic. EOMI are intact. Mouth is well hydrated and without lesions. NECK: Supple. No masses LUNGS: Clear to auscultation. No presence of rhonchi/wheezing/rales. Adequate chest expansion HEART: RRR, normal s1 and s2. ABDOMEN: Soft, nontender, no guarding, no peritoneal signs, and nondistended. BS +. No masses. EXTREMITIES: Without any cyanosis, clubbing, rash, lesions or edema. NEUROLOGIC: AOx3, no focal motor deficit. SKIN: no jaundice, no rashes  Laboratory Data CBC:     Component Value Date/Time   WBC 6.5 10/08/2020 0303   RBC 3.02 (L) 10/08/2020 0303   HGB 8.9 (L) 10/08/2020 0303   HCT 27.0 (L) 10/08/2020 0303   PLT 233 10/08/2020 0303   MCV 89.4 10/08/2020 0303   MCH 29.5 10/08/2020 0303   MCHC 33.0 10/08/2020 0303   RDW 12.2 10/08/2020 0303   LYMPHSABS 2.2 11/25/2009 1337   MONOABS 0.4 11/25/2009 1337   EOSABS 0.1 11/25/2009 1337   BASOSABS 0.0 11/25/2009 1337   COAG: No results found for: INR, PROTIME  BMP:  BMP Latest Ref Rng & Units 10/08/2020 10/07/2020 10/06/2020  Glucose 70 - 99 mg/dL 118(H) 122(H) 115(H)  BUN 6 - 20 mg/dL $Remove'10 7 7  'TZreRVr$ Creatinine 0.61 - 1.24 mg/dL 0.68 0.67 0.67  Sodium 135 - 145 mmol/L 139 138 135  Potassium 3.5 - 5.1 mmol/L 3.3(L) 3.6 3.8  Chloride 98 - 111 mmol/L 109 105 106  CO2 22 - 32 mmol/L $RemoveB'25 23 24  'RytDUTvx$ Calcium 8.9 - 10.3 mg/dL 8.6(L) 8.4(L) 8.1(L)    HEPATIC:  Hepatic Function Latest Ref Rng & Units 10/08/2020 10/07/2020 10/06/2020  Total Protein 6.5 - 8.1 g/dL - - -  Albumin 3.5 - 5.0 g/dL 2.6(L)  2.5(L) 2.6(L)  AST 15 - 41 U/L - - -  ALT 0 - 44 U/L - - -  Alk Phosphatase 38 - 126 U/L - - -  Total Bilirubin 0.3 - 1.2 mg/dL - - -    CARDIAC: No  results found for: CKTOTAL, CKMB, CKMBINDEX, TROPONINI   Imaging: I personally reviewed and interpreted the available imaging.  Assessment & Plan: Tim [name] is a 56 y.o. male with history of anxiety, arthritis, bipolar disorder, depression, hyperlipidemia, hypertension, history of drug and alcohol abuse, who comes to the hospital for evaluation after presenting an episode of food impaction -first episode. The patient has not presented any previous red flag signs. DDx includes possible peptic strictures vs less likely EoE, malignancy is lower in the differential. For now, we'll give to keep the patient nothing by mouth until the procedure is performed. He will require general anesthesia due to the risk of aspiration.  - Keep NPO - Emergent EGD - Anesthesia evaluation for general anesthesia - high risk of aspiration   Harvel Quale, MD Gastroenterology and Hepatology [facility]

## 2021-03-03 NOTE — ED Provider Notes (Signed)
[facility] EMERGENCY DEPARTMENT Provider Note   CSN: [id number] Arrival date & time: 03/03/21  1436     History  Chief Complaint  Patient presents with   Foreign Body    Tim [name] is a 56 y.o. male.   Foreign Body  This patient is a 56 year old male, he has a prior history of progressive dysphagia over the last year, presents to the hospital today after last eating solid food yesterday at 2 PM becoming lodged in his esophagus and vomiting everything since that time, not even tolerating his own secretions.  No history of endoscopy.  And no hx of stricture.    The patient is not having any difficulty breathing, there is no swelling of the legs, he has no history of cardiac disease.  The patient does have a history of significant obesity for which she has lost 80 pounds over the last year.  Home Medications Prior to Admission medications   Medication Sig Start Date End Date Taking? Authorizing Provider  acetaminophen (TYLENOL) 500 MG tablet Take 1,500 mg by mouth in the morning, at noon, and at bedtime.    [provider]  atorvastatin (LIPITOR) 40 MG tablet Take 1 tablet (40 mg total) by mouth daily. Patient taking differently: Take 40 mg by mouth every evening. 08/30/16   Eustace Moore, MD  cyclobenzaprine (FLEXERIL) 10 MG tablet Take 1 tablet (10 mg total) by mouth 3 (three) times daily as needed for muscle spasms. 10/08/20   Donalee Citrin, MD  diclofenac (VOLTAREN) 75 MG EC tablet Take 75 mg by mouth 2 (two) times daily.  01/26/19   [provider]  lisinopril (ZESTRIL) 5 MG tablet Take 5 mg by mouth daily.    [provider]  magnesium oxide (MAG-OX) 400 MG tablet Take 400 mg by mouth at bedtime.    [provider]  Melatonin 10 MG TABS Take 10 mg by mouth at bedtime.     [provider]  Omega-3 Fatty Acids (FISH OIL) 1000 MG CAPS Take 1,000 mg by mouth in the morning, at noon, and at bedtime.    [provider]   Potassium 99 MG TABS Take 99 mg by mouth in the morning.    [provider]  QUEtiapine (SEROQUEL) 50 MG tablet Take 150 mg by mouth at bedtime.    [provider]      Allergies    Patient has no known allergies.    Review of Systems   Review of Systems  All other systems reviewed and are negative.  Physical Exam Updated Vital Signs BP (!) 172/98 (BP Location: Right Arm)    Pulse (!) 102    Temp 98.6 F (37 C)    Resp 20    Ht 1.803 m (5\' 11" )    Wt 99.3 kg    SpO2 100%    BMI 30.53 kg/m  Physical Exam Vitals and nursing note reviewed.  Constitutional:      General: He is not in acute distress.    Appearance: He is well-developed.  HENT:     Head: Normocephalic and atraumatic.     Nose: No congestion or rhinorrhea.     Mouth/Throat:     Mouth: Mucous membranes are moist.     Pharynx: No oropharyngeal exudate.  Eyes:     General: No scleral icterus.       Right eye: No discharge.        Left eye: No discharge.  Conjunctiva/sclera: Conjunctivae normal.     Pupils: Pupils are equal, round, and reactive to light.  Neck:     Thyroid: No thyromegaly.     Vascular: No JVD.  Cardiovascular:     Rate and Rhythm: Normal rate and regular rhythm.     Heart sounds: Normal heart sounds. No murmur heard.   No friction rub. No gallop.  Pulmonary:     Effort: Pulmonary effort is normal. No respiratory distress.     Breath sounds: Normal breath sounds. No wheezing or rales.  Abdominal:     General: Bowel sounds are normal. There is no distension.     Palpations: Abdomen is soft. There is no mass.     Tenderness: There is no abdominal tenderness.  Musculoskeletal:        General: No tenderness. Normal range of motion.     Cervical back: Normal range of motion and neck supple.     Right lower leg: No edema.     Left lower leg: No edema.  Lymphadenopathy:     Cervical: No cervical adenopathy.  Skin:    General: Skin is warm and dry.     Findings: No erythema  or rash.  Neurological:     Mental Status: He is alert.     Coordination: Coordination normal.     Comments: Patient is awake alert and speaking in full sentences  Psychiatric:        Behavior: Behavior normal.    ED Results / Procedures / Treatments   Labs (all labs ordered are listed, but only abnormal results are displayed) Labs Reviewed  RESP PANEL BY RT-PCR (FLU A&B, COVID) ARPGX2  CBC WITH DIFFERENTIAL/PLATELET  BASIC METABOLIC PANEL  TYPE AND SCREEN    EKG None  Radiology DG Chest 2 View  Result Date: 03/03/2021 CLINICAL DATA:  Foreign body EXAM: CHEST - 2 VIEW COMPARISON:  Chest x-ray dated Jun 03, 2010 FINDINGS: The heart size and mediastinal contours are within normal limits. New nodular opacity of the left upper lobe. Both lungs are clear. The visualized skeletal structures are unremarkable. IMPRESSION: 1. No radiopaque foreign body seen in the region of the chest. 2. New indeterminate nodular opacity of the left upper lobe. Recommend chest CT for further evaluation. Electronically Signed   By: Allegra Lai M.D.   On: 03/03/2021 16:04    Procedures Procedures    Medications Ordered in ED Medications - No data to display  ED Course/ Medical Decision Making/ A&P                           Medical Decision Making Amount and/or Complexity of Data Reviewed Labs: ordered. Radiology: ordered. ECG/medicine tests: ordered.   This patient presents to the ED for concern of esophageal food impaction, likely esophageal stricture, this involves an extensive number of treatment options, and is a complaint that carries with it a high risk of complications and morbidity.  The differential diagnosis includes esophageal stricture, consider external forces such as tumor though this seems less likely   Co morbidities that complicate the patient evaluation  Progressive dysphagia   Additional history obtained:  Additional history obtained from electronic medical  record External records from outside source obtained and reviewed including prior surgery by neurosurgery on spine, no prior endoscopic procedures   Lab Tests:  I Ordered, and personally interpreted labs.  The pertinent results include: Overall unremarkable   Imaging Studies ordered:  I ordered imaging studies  including chest x-ray I independently visualized and interpreted imaging which showed no acute findings, no perforations, no free air in the mediastinum, normal lungs, possible nodules, the patient was informed of this at the bedside I agree with the radiologist interpretation   Cardiac Monitoring:  The patient was maintained on a cardiac monitor.  I personally viewed and interpreted the cardiac monitored which showed an underlying rhythm of: Normal sinus rhythm   Medicines ordered and prescription drug management:  Patient was made n.p.o., he will need medications for endoscopic but nothing was given in the ER, his food impaction has been there for over 24 hours thus glucagon would be unlikely to be helpful   Test Considered:  CT scan of the chest though endoscopy will be more important   Critical Interventions:  Consulted with Dr. Levon Hedger of the GI service who will come to do an endoscopy   Consultations Obtained:  I requested consultation with the Dr. Levon Hedger with GI,  and discussed lab and imaging findings as well as pertinent plan - they recommend: Endoscopy   Problem List / ED Course:  Esophageal stricture, food impaction, needs to be scoped, n.p.o. at this time   Reevaluation:  After the interventions noted above, I reevaluated the patient and found that they have :stayed the same   Social Determinants of Health:  None   Dispostion:  After consideration of the diagnostic results and the patients response to treatment, I feel that the patent would benefit from admission with endoscopy.          Final Clinical Impression(s) / ED  Diagnoses Final diagnoses:  Dysphagia, unspecified type  Food impaction of esophagus, initial encounter    Rx / DC Orders ED Discharge Orders     None         Eber Hong, MD 03/03/21 1811

## 2021-03-03 NOTE — Anesthesia Procedure Notes (Signed)
Procedure Name: Intubation Date/Time: 03/03/2021 7:19 PM Performed by: Louann Sjogren, MD Pre-anesthesia Checklist: Patient identified, Emergency Drugs available, Suction available, Patient being monitored and Timeout performed Patient Re-evaluated:Patient Re-evaluated prior to induction Oxygen Delivery Method: Circle system utilized Preoxygenation: Pre-oxygenation with 100% oxygen Induction Type: IV induction, Rapid sequence and Cricoid Pressure applied Laryngoscope Size: Glidescope and 3 Grade View: Grade I Tube type: Oral Tube size: 7.0 mm Number of attempts: 1 Airway Equipment and Method: Video-laryngoscopy and Stylet Placement Confirmation: ETT inserted through vocal cords under direct vision, positive ETCO2 and breath sounds checked- equal and bilateral Secured at: 22 cm Tube secured with: Tape Dental Injury: Teeth and Oropharynx as per pre-operative assessment

## 2021-03-03 NOTE — Op Note (Signed)
[facility] Patient Name: Tim [name] Procedure Date: 03/03/2021 7:00 PM MRN: [id number] Date of Birth: 09-01-1965 Attending MD: Maylon Peppers ,  CSN: [id number] Age: 56 Admit Type: Inpatient Procedure:                Upper GI endoscopy Indications:              Foreign body in the esophagus Providers:                Maylon Peppers, Lurline Del, RN, Casimer Bilis, Technician Referring MD:              Medicines:                Monitored Anesthesia Care Complications:            No immediate complications. Estimated Blood Loss:     Estimated blood loss: none. Procedure:                Pre-Anesthesia Assessment:                           - Prior to the procedure, a History and Physical                            was performed, and patient medications, allergies                            and sensitivities were reviewed. The patient's                            tolerance of previous anesthesia was reviewed.                           - The risks and benefits of the procedure and the                            sedation options and risks were discussed with the                            patient. All questions were answered and informed                            consent was obtained.                           - ASA Grade Assessment: II - A patient with mild                            systemic disease.                           After obtaining informed consent, the endoscope was                            passed under direct vision. Throughout the  procedure, the patient's blood pressure, pulse, and                            oxygen saturations were monitored continuously. The                            [id number] scope was introduced through the                            mouth, and advanced to the second part of duodenum.                            The upper GI endoscopy was accomplished without                             difficulty. The patient tolerated the procedure                            well. Scope In: 7:22:43 PM Scope Out: 7:28:02 PM Total Procedure Duration: 0 hours 5 minutes 19 seconds  Findings:      Solid food was found in the middle third of the esophagus. Removal of       food was accomplished with a Roth net.      Mildly severe esophagitis with no bleeding was found in the lower third       of the esophagus. Mildly narrowed at the GE junction but scope passed       easily.      The entire examined stomach was normal.      The examined duodenum was normal. Impression:               - Food in the middle third of the esophagus.                            Removal was successful.                           - Mildly severe esophagitis with no bleeding.                           - Normal stomach.                           - Normal examined duodenum. Moderate Sedation:      Per Anesthesia Care Recommendation:           - Discharge patient to home (ambulatory).                           - Soft diet.                           - Use Prilosec (omeprazole) 40 mg PO BID for 2                            months, then decrease to once a day.                           -  Repeat upper endoscopy in 6 weeks for                            surveillance. Procedure Code(s):        --- Professional ---                           [phone], Esophagogastroduodenoscopy, flexible,                            transoral; with removal of foreign body(s) Diagnosis Code(s):        --- Professional ---                           [id number], Food in esophagus causing other injury,                            initial encounter                           K20.90, Esophagitis, unspecified without bleeding                           T18.108A, Unspecified foreign body in esophagus                            causing other injury, initial encounter CPT copyright 2019 [employer] Medical Association. All rights reserved. The codes  documented in this report are preliminary and upon coder review may  be revised to meet current compliance requirements. Maylon Peppers, MD Maylon Peppers,  03/03/2021 7:34:45 PM This report has been signed electronically. Number of Addenda: 0

## 2021-03-03 NOTE — Anesthesia Preprocedure Evaluation (Signed)
Anesthesia Evaluation  Patient identified by MRN, date of birth, ID band Patient awake    Reviewed: Allergy & Precautions, H&P , NPO status , Patient's Chart, lab work & pertinent test results, reviewed documented beta blocker date and time   Airway Mallampati: II  TM Distance: >3 FB Neck ROM: full    Dental no notable dental hx.    Pulmonary neg pulmonary ROS, former smoker,    Pulmonary exam normal breath sounds clear to auscultation       Cardiovascular Exercise Tolerance: Good hypertension, negative cardio ROS   Rhythm:regular Rate:Normal     Neuro/Psych  Headaches, PSYCHIATRIC DISORDERS Anxiety Depression Bipolar Disorder    GI/Hepatic negative GI ROS, Neg liver ROS,   Endo/Other  negative endocrine ROS  Renal/GU negative Renal ROS  negative genitourinary   Musculoskeletal   Abdominal   Peds  Hematology negative hematology ROS (+)   Anesthesia Other Findings   Reproductive/Obstetrics negative OB ROS                             Anesthesia Physical Anesthesia Plan  ASA: 4 and emergent  Anesthesia Plan: General and General ETT   Post-op Pain Management:    Induction:   PONV Risk Score and Plan: Propofol infusion  Airway Management Planned:   Additional Equipment:   Intra-op Plan:   Post-operative Plan:   Informed Consent: I have reviewed the patients History and Physical, chart, labs and discussed the procedure including the risks, benefits and alternatives for the proposed anesthesia with the patient or authorized representative who has indicated his/her understanding and acceptance.     Dental Advisory Given  Plan Discussed with: CRNA  Anesthesia Plan Comments:         Anesthesia Quick Evaluation

## 2021-03-03 NOTE — Transfer of Care (Signed)
Immediate Anesthesia Transfer of Care Note  Patient: Tim [name]  Procedure(s) Performed: ESOPHAGOGASTRODUODENOSCOPY (EGD) WITH PROPOFOL  Patient Location: PACU  Anesthesia Type:General  Level of Consciousness: awake  Airway & Oxygen Therapy: Patient Spontanous Breathing  Post-op Assessment: Report given to RN and Post -op Vital signs reviewed and stable  Post vital signs: Reviewed and stable  Last Vitals:  Vitals Value Taken Time  BP 148/100 03/03/21 1937  Temp    Pulse 113 03/03/21 1939  Resp 15 03/03/21 1939  SpO2 98 % 03/03/21 1939  Vitals shown include unvalidated device data.  Last Pain: There were no vitals filed for this visit.       Complications: No notable events documented.

## 2021-03-07 ENCOUNTER — Encounter (HOSPITAL_COMMUNITY): Payer: Self-pay | Admitting: Gastroenterology

## 2021-03-30 ENCOUNTER — Encounter (HOSPITAL_COMMUNITY): Payer: Self-pay | Admitting: Radiology

## 2021-04-11 ENCOUNTER — Other Ambulatory Visit: Payer: Self-pay

## 2021-04-11 ENCOUNTER — Encounter (HOSPITAL_COMMUNITY): Payer: Self-pay | Admitting: *Deleted

## 2021-04-11 ENCOUNTER — Emergency Department (HOSPITAL_COMMUNITY): Payer: 59

## 2021-04-11 ENCOUNTER — Emergency Department (HOSPITAL_COMMUNITY)
Admission: EM | Admit: 2021-04-11 | Discharge: 2021-04-11 | Disposition: A | Discharge status: Home / Self Care | Payer: 59 | Attending: Emergency Medicine | Admitting: Emergency Medicine

## 2021-04-11 DIAGNOSIS — Z79899 Other long term (current) drug therapy: Secondary | ICD-10-CM | POA: Diagnosis not present

## 2021-04-11 DIAGNOSIS — G8929 Other chronic pain: Secondary | ICD-10-CM

## 2021-04-11 DIAGNOSIS — F10921 Alcohol use, unspecified with intoxication delirium: Secondary | ICD-10-CM

## 2021-04-11 DIAGNOSIS — R0902 Hypoxemia: Secondary | ICD-10-CM | POA: Diagnosis not present

## 2021-04-11 DIAGNOSIS — Y908 Blood alcohol level of 240 mg/100 ml or more: Secondary | ICD-10-CM | POA: Diagnosis not present

## 2021-04-11 DIAGNOSIS — M25511 Pain in right shoulder: Secondary | ICD-10-CM | POA: Diagnosis not present

## 2021-04-11 DIAGNOSIS — J9811 Atelectasis: Secondary | ICD-10-CM | POA: Diagnosis not present

## 2021-04-11 DIAGNOSIS — M25512 Pain in left shoulder: Secondary | ICD-10-CM | POA: Diagnosis not present

## 2021-04-11 DIAGNOSIS — Z20822 Contact with and (suspected) exposure to covid-19: Secondary | ICD-10-CM | POA: Insufficient documentation

## 2021-04-11 DIAGNOSIS — I10 Essential (primary) hypertension: Secondary | ICD-10-CM | POA: Diagnosis not present

## 2021-04-11 DIAGNOSIS — R4182 Altered mental status, unspecified: Secondary | ICD-10-CM | POA: Diagnosis not present

## 2021-04-11 DIAGNOSIS — F10121 Alcohol abuse with intoxication delirium: Secondary | ICD-10-CM | POA: Diagnosis not present

## 2021-04-11 DIAGNOSIS — R69 Illness, unspecified: Secondary | ICD-10-CM | POA: Diagnosis not present

## 2021-04-11 DIAGNOSIS — R7981 Abnormal blood-gas level: Secondary | ICD-10-CM | POA: Insufficient documentation

## 2021-04-11 LAB — URINALYSIS, ROUTINE W REFLEX MICROSCOPIC
Bacteria, UA: NONE SEEN
Bilirubin Urine: NEGATIVE
Glucose, UA: NEGATIVE mg/dL
Ketones, ur: NEGATIVE mg/dL
Leukocytes,Ua: NEGATIVE
Nitrite: NEGATIVE
Protein, ur: NEGATIVE mg/dL
Specific Gravity, Urine: 1.009 (ref 1.005–1.030)
pH: 6 (ref 5.0–8.0)

## 2021-04-11 LAB — CBC WITH DIFFERENTIAL/PLATELET
Abs Immature Granulocytes: 0.06 10*3/uL (ref 0.00–0.07)
Basophils Absolute: 0 10*3/uL (ref 0.0–0.1)
Basophils Relative: 0 %
Eosinophils Absolute: 0 10*3/uL (ref 0.0–0.5)
Eosinophils Relative: 0 %
HCT: 46.2 % (ref 39.0–52.0)
Hemoglobin: 15.7 g/dL (ref 13.0–17.0)
Immature Granulocytes: 1 %
Lymphocytes Relative: 26 %
Lymphs Abs: 2.9 10*3/uL (ref 0.7–4.0)
MCH: 30 pg (ref 26.0–34.0)
MCHC: 34 g/dL (ref 30.0–36.0)
MCV: 88.3 fL (ref 80.0–100.0)
Monocytes Absolute: 0.5 10*3/uL (ref 0.1–1.0)
Monocytes Relative: 4 %
Neutro Abs: 7.6 10*3/uL (ref 1.7–7.7)
Neutrophils Relative %: 69 %
Platelets: 277 10*3/uL (ref 150–400)
RBC: 5.23 MIL/uL (ref 4.22–5.81)
RDW: 12.1 % (ref 11.5–15.5)
WBC: 11.2 10*3/uL — ABNORMAL HIGH (ref 4.0–10.5)
nRBC: 0 % (ref 0.0–0.2)

## 2021-04-11 LAB — ACETAMINOPHEN LEVEL: Acetaminophen (Tylenol), Serum: <10 ug/mL — ABNORMAL LOW (ref 10–30)

## 2021-04-11 LAB — COMPREHENSIVE METABOLIC PANEL
ALT: 38 U/L (ref 0–44)
AST: 33 U/L (ref 15–41)
Albumin: 4.5 g/dL (ref 3.5–5.0)
Alkaline Phosphatase: 85 U/L (ref 38–126)
Anion gap: 17 — ABNORMAL HIGH (ref 5–15)
BUN: 13 mg/dL (ref 6–20)
CO2: 22 mmol/L (ref 22–32)
Calcium: 8.9 mg/dL (ref 8.9–10.3)
Chloride: 101 mmol/L (ref 98–111)
Creatinine, Ser: 0.7 mg/dL (ref 0.61–1.24)
GFR, Estimated: >60 mL/min (ref >60)
Glucose, Bld: 86 mg/dL (ref 70–99)
Potassium: 3.5 mmol/L (ref 3.5–5.1)
Sodium: 140 mmol/L (ref 135–145)
Total Bilirubin: 0.7 mg/dL (ref 0.3–1.2)
Total Protein: 7.4 g/dL (ref 6.5–8.1)

## 2021-04-11 LAB — RAPID URINE DRUG SCREEN, HOSP PERFORMED
Amphetamines: NOT DETECTED
Barbiturates: NOT DETECTED
Benzodiazepines: NOT DETECTED
Cocaine: NOT DETECTED
Opiates: NOT DETECTED
Tetrahydrocannabinol: NOT DETECTED

## 2021-04-11 LAB — RESP PANEL BY RT-PCR (FLU A&B, COVID) ARPGX2
Influenza A by PCR: NEGATIVE
Influenza B by PCR: NEGATIVE
SARS Coronavirus 2 by RT PCR: NEGATIVE

## 2021-04-11 LAB — BLOOD GAS, VENOUS
Acid-base deficit: 1.3 mmol/L (ref 0.0–2.0)
Bicarbonate: 22.8 mmol/L (ref 20.0–28.0)
FIO2: 21 %
O2 Saturation: 93.6 %
Patient temperature: 36.8
pCO2, Ven: 36 mmHg — ABNORMAL LOW (ref 44–60)
pH, Ven: 7.41 (ref 7.25–7.43)
pO2, Ven: 68 mmHg — ABNORMAL HIGH (ref 32–45)

## 2021-04-11 LAB — ETHANOL: Alcohol, Ethyl (B): 392 mg/dL (ref <10)

## 2021-04-11 LAB — AMMONIA: Ammonia: 44 umol/L — ABNORMAL HIGH (ref 9–35)

## 2021-04-11 LAB — CK: Total CK: 371 U/L (ref 49–397)

## 2021-04-11 LAB — SALICYLATE LEVEL: Salicylate Lvl: <7 mg/dL — ABNORMAL LOW (ref 7.0–30.0)

## 2021-04-11 MED ORDER — STERILE WATER FOR INJECTION IJ SOLN
INTRAMUSCULAR | Status: AC
  Administered 2021-04-11: 1.2 mL
  Filled 2021-04-11: qty 10

## 2021-04-11 MED ORDER — KETOROLAC TROMETHAMINE 30 MG/ML IJ SOLN: 15.0000 mg | Freq: Once | INTRAMUSCULAR | Status: DC

## 2021-04-11 MED ORDER — ZIPRASIDONE MESYLATE 20 MG IM SOLR
20.0000 mg | Freq: Once | INTRAMUSCULAR | Status: AC
  Administered 2021-04-11: 20 mg via INTRAMUSCULAR
  Filled 2021-04-11: qty 20

## 2021-04-11 MED ORDER — LACTATED RINGERS IV BOLUS
2000.0000 mL | Freq: Once | INTRAVENOUS | Status: AC
  Administered 2021-04-11: 2000 mL via INTRAVENOUS

## 2021-04-11 NOTE — ED Notes (Signed)
Patient remains thrashing about in bed. Will not cooperate for EKG. Father remains at bedside attempting to calm patient. Patient not communicating, only yelling curse words randomly.  ?

## 2021-04-11 NOTE — ED Notes (Signed)
Refused toradol. Denies pain. A&O x 4.  Standing up pacing and states he is ready to go. Father remains with patient.  ?

## 2021-04-11 NOTE — ED Notes (Signed)
Patient rolling around in bed. Sitting up with eyes wide and cursing and swinging arms then falling back asleep. Grabbing his father's arm and squeezing it and swinging arms at him.  ?

## 2021-04-11 NOTE — ED Provider Notes (Signed)
?[facility] EMERGENCY DEPARTMENT ?Provider Note ? ? ?CSN: [id number] ?Arrival date & time: 04/11/21  1435 ? ?  ? ?History ? ?Chief Complaint  ?Patient presents with  ? Altered Mental Status  ? ? ?Tim [name] is a 56 y.o. male. ? ?HPI ? ?  ? ?56 year old male comes in with chief complaint of altered mental status. ? ?Patient has history of bipolar disorder, remote history of polysubstance use, hypertension, metabolic syndrome. ? ?He is here with his father.  Patient brought here by father.  Father indicates that patient's son had called him and wanted him to come and see him.  When he arrived, his son was hard to understand.  He thinks that the son has not really slept well over the last few days and is having bipolar disorder and possibly SI.  He last saw the son 3 days ago, and is baseline normal he is functioning well and lives with his 2 sons.  He does not think that there was any drugs involved.  He is unsure about patient's compliance to his medications. ? ?Patient is not answering any questions.  He is noted to be swinging at his father, but not violently, who is trying to console him.  Nursing staff indicates that he was cursing loudly when he first arrived. ? ?Home Medications ?Prior to Admission medications   ?Medication Sig Start Date End Date Taking? Authorizing Provider  ?acetaminophen (TYLENOL) 500 MG tablet Take 1,500 mg by mouth in the morning, at noon, and at bedtime.    [provider]  ?atorvastatin (LIPITOR) 40 MG tablet Take 1 tablet (40 mg total) by mouth daily. ?Patient taking differently: Take 40 mg by mouth every evening. 08/30/16   Eustace MooreNelson, Yvonne Sue, MD  ?cyclobenzaprine (FLEXERIL) 10 MG tablet Take 1 tablet (10 mg total) by mouth 3 (three) times daily as needed for muscle spasms. 10/08/20   Donalee Citrinram, Gary, MD  ?diclofenac (VOLTAREN) 75 MG EC tablet Take 75 mg by mouth 2 (two) times daily.  01/26/19   [provider]  ?lisinopril (ZESTRIL) 5 MG tablet Take 5 mg by mouth daily.     [provider]  ?magnesium oxide (MAG-OX) 400 MG tablet Take 400 mg by mouth at bedtime.    [provider]  ?Melatonin 10 MG TABS Take 10 mg by mouth at bedtime.     [provider]  ?Omega-3 Fatty Acids (FISH OIL) 1000 MG CAPS Take 1,000 mg by mouth in the morning, at noon, and at bedtime.    [provider]  ?omeprazole (PRILOSEC) 40 MG capsule Take 1 capsule (40 mg total) by mouth daily. 03/03/21   Dolores Frameastaneda Mayorga, Daniel, MD  ?Potassium 99 MG TABS Take 99 mg by mouth in the morning.    [provider]  ?QUEtiapine (SEROQUEL) 50 MG tablet Take 150 mg by mouth at bedtime.    [provider]  ?traMADol (ULTRAM) 50 MG tablet TAKE 1 TO 2 TABLETS BY MOUTH EVERY 4 TO 6 HOURS AS NEEDED . DO NOT EXCEED 8 PER 24 HOURS as needed for pain 02/24/21   [provider]  ?   ? ?Allergies    ?Patient has no known allergies.   ? ?Review of Systems   ?Review of Systems  ?Unable to perform ROS: Mental status change  ? ?Physical Exam ?Updated Vital Signs ?BP (!) 142/78 (BP Location: Right Arm)   Pulse (!) 102   Temp 98.3 ?F (36.8 ?C) (Oral)   Resp (!) 22  Ht 5\' 11"  (1.803 m)   Wt 99.8 kg   SpO2 95%   BMI 30.68 kg/m?  ?Physical Exam ?Vitals and nursing note reviewed.  ?Constitutional:   ?   General: He is not in acute distress. ?   Appearance: He is well-developed. He is not diaphoretic.  ?HENT:  ?   Head: Atraumatic.  ?Eyes:  ?   Extraocular Movements: Extraocular movements intact.  ?   Pupils: Pupils are equal, round, and reactive to light.  ?Cardiovascular:  ?   Rate and Rhythm: Normal rate.  ?Pulmonary:  ?   Effort: Pulmonary effort is normal.  ?Musculoskeletal:  ?   Cervical back: Neck supple.  ?Skin: ?   General: Skin is warm.  ?Neurological:  ?   Mental Status: He is disoriented.  ?   Comments: Moving all 4 extremities  ?Psychiatric:  ?   Comments: Abnormal behavior, judgment, affect  ? ? ?ED Results / Procedures / Treatments   ?Labs ?(all labs ordered are  listed, but only abnormal results are displayed) ?Labs Reviewed  ?COMPREHENSIVE METABOLIC PANEL - Abnormal; Notable for the following components:  ?    Result Value  ? Anion gap 17 (*)   ? All other components within normal limits  ?ETHANOL - Abnormal; Notable for the following components:  ? Alcohol, Ethyl (B) 392 (*)   ? All other components within normal limits  ?CBC WITH DIFFERENTIAL/PLATELET - Abnormal; Notable for the following components:  ? WBC 11.2 (*)   ? All other components within normal limits  ?SALICYLATE LEVEL - Abnormal; Notable for the following components:  ? Salicylate Lvl <7.0 (*)   ? All other components within normal limits  ?ACETAMINOPHEN LEVEL - Abnormal; Notable for the following components:  ? Acetaminophen (Tylenol), Serum <10 (*)   ? All other components within normal limits  ?URINALYSIS, ROUTINE W REFLEX MICROSCOPIC - Abnormal; Notable for the following components:  ? Color, Urine STRAW (*)   ? Hgb urine dipstick MODERATE (*)   ? All other components within normal limits  ?AMMONIA - Abnormal; Notable for the following components:  ? Ammonia 44 (*)   ? All other components within normal limits  ?BLOOD GAS, VENOUS - Abnormal; Notable for the following components:  ? pCO2, Ven 36 (*)   ? pO2, Ven 68 (*)   ? All other components within normal limits  ?RESP PANEL BY RT-PCR (FLU A&B, COVID) ARPGX2  ?RAPID URINE DRUG SCREEN, HOSP PERFORMED  ?CK  ? ? ?EKG ?None ? ?Radiology ?DG Chest Port 1 View ? ?Result Date: 04/11/2021 ?CLINICAL DATA:  Hypoxia, altered mental status EXAM: PORTABLE CHEST 1 VIEW COMPARISON:  03/03/2021 chest radiograph. FINDINGS: Low lung volumes. Stable cardiomediastinal silhouette with normal heart size. No pneumothorax. No pleural effusion. No pulmonary edema. Mild streaky bibasilar atelectasis. No consolidative airspace disease. IMPRESSION: Low lung volumes with mild streaky bibasilar atelectasis. Electronically Signed   By: 05/01/2021 M.D.   On: 04/11/2021 17:16    ? ?Procedures ?03/23/2023Critical Care ?Performed by: Marland Kitchen, MD ?Authorized by: Derwood Kaplan, MD  ? ?Critical care provider statement:  ?  Critical care time (minutes):  30 ?  Critical care was necessary to treat or prevent imminent or life-threatening deterioration of the following conditions:  Toxidrome ?  Critical care was time spent personally by me on the following activities:  Development of treatment plan with patient or surrogate, discussions with consultants, evaluation of patient's response to treatment, examination of patient, ordering and review of  laboratory studies, ordering and review of radiographic studies, ordering and performing treatments and interventions, pulse oximetry, re-evaluation of patient's condition, review of old charts and obtaining history from patient or surrogate  ? ? ?Medications Ordered in ED ?Medications  ?ketorolac (TORADOL) 30 MG/ML injection 15 mg (has no administration in time range)  ?ziprasidone (GEODON) injection 20 mg (20 mg Intramuscular Given 04/11/21 1603)  ?lactated ringers bolus 2,000 mL (2,000 mLs Intravenous New Bag/Given 04/11/21 1736)  ?sterile water (preservative free) injection (1.2 mLs  Given 04/11/21 1604)  ? ? ?ED Course/ Medical Decision Making/ A&P ?Clinical Course as of 04/11/21 1855  ?Tue Apr 11, 2021  ?1745 Alcohol, Ethyl (B)(!!): 392 ?Alcohol level is significantly elevated.  Results discussed with the family. [AN]  ?1853 Blood gas, venous (at [facility] and [facility], not at [facility])(!) ?Blood gas, UDS, respiratory panel, CBC are all normal.  Metabolic profile is reassuring as well.  Tox work-up is negative besides ethanol. ? ?Patient now awake, answers all questions appropriately.  Indicates that he drank because he has been having shoulder pain.  He cannot get shoulder surgery because he has 2 kids and he works.  Has tried injections in the past without success.  Father wants to take him home. ? ?Patient is clinically sober. He is talking coherently, gait is  normal, and is demonstrating rational thought process. We shall discharge him shortly, and we have discussed the warning signs of alcohol withdrawal with him verbally, and the information will be provided with the disc

## 2021-04-11 NOTE — ED Notes (Signed)
Patient up to sit on side of bed to void in urinal. Small amount noted in urinal. Sent to lab. Patient continues to refuse to cooperate for EKG.  ?

## 2021-04-11 NOTE — ED Triage Notes (Signed)
No food intake in 3 days, pain in shoulders and back ?

## 2021-04-11 NOTE — Discharge Instructions (Addendum)
Consider seeing an orthopedic doctor for further appropriate management of your shoulder. ? ?Refrain from heavy alcohol use. You will not be legally sober until 8 am - please don't drive. ?

## 2021-04-11 NOTE — ED Triage Notes (Signed)
Does not answer any questions in triage ?

## 2021-04-13 ENCOUNTER — Other Ambulatory Visit: Payer: Self-pay

## 2021-04-13 ENCOUNTER — Ambulatory Visit (INDEPENDENT_AMBULATORY_CARE_PROVIDER_SITE_OTHER): Payer: 59 | Admitting: Gastroenterology

## 2021-04-13 ENCOUNTER — Encounter (INDEPENDENT_AMBULATORY_CARE_PROVIDER_SITE_OTHER): Payer: Self-pay

## 2021-04-13 ENCOUNTER — Other Ambulatory Visit (INDEPENDENT_AMBULATORY_CARE_PROVIDER_SITE_OTHER): Payer: Self-pay

## 2021-04-13 ENCOUNTER — Encounter (INDEPENDENT_AMBULATORY_CARE_PROVIDER_SITE_OTHER): Payer: Self-pay | Admitting: Gastroenterology

## 2021-04-13 VITALS — BP 137/81 | HR 108 | Temp 98.6°F | Ht 71.0 in | Wt 231.5 lb

## 2021-04-13 DIAGNOSIS — Z791 Long term (current) use of non-steroidal anti-inflammatories (NSAID): Secondary | ICD-10-CM

## 2021-04-13 DIAGNOSIS — R69 Illness, unspecified: Secondary | ICD-10-CM | POA: Diagnosis not present

## 2021-04-13 DIAGNOSIS — F1010 Alcohol abuse, uncomplicated: Secondary | ICD-10-CM | POA: Diagnosis not present

## 2021-04-13 DIAGNOSIS — K209 Esophagitis, unspecified without bleeding: Secondary | ICD-10-CM | POA: Diagnosis not present

## 2021-04-13 DIAGNOSIS — R1319 Other dysphagia: Secondary | ICD-10-CM | POA: Diagnosis not present

## 2021-04-13 NOTE — H&P (View-Only) (Signed)
? ?Referring Provider: Mann, Benjamin L, PA-C ?Primary Care Physician:  Mann, Benjamin L, PA-C ?Primary GI Physician: castaneda ? ?Chief Complaint  ?Patient presents with  ? Hospitalization Follow-up  ?  ED follow up. Getting choked on dry food. Concerned about results of chest xray results.   ? ?HPI:   ?Tim [name] is a 55 y.o. male with past medical history of anxiety, arthritis, Bipolar disorder, depression, hLD, HTN, substance abuse (alcohol and drugs). ? ?Patient presenting today for follow up after ED visit for food impaction in February. ? ?Patient presented to ED on 03/03/21 with food impaction after eating short ribs the day prior, had not been able to swallow since 2pm the day before. Felt persistent discomfort in midsternal area. Underwent EGD at that time with Dr. Castaneda with food in middle third of esophagus, successfully removed. Mildly severe esophagitis with no bleeding, otherwise normal exam. Recommended repeat EGD 6 weeks after. Started on omeprazole 40mg daily  ? ?He reports 2 episodes of dysphagia since EGD in February. This occurred with tuna, though he was able to get food bolus to pass with drinking a lot of water. Continues to take omeprazole daily. He denies any issues with heartburn or acid regurgitation. Denies any odynophagia. No changes in appetite, weight loss, odynophagia, nausea, vomiting, rectal bleeding or melena. No issues with constipation or diarrhea. Takes diclofenac 75mg BID everyday and has hx of alcohol abuse, though tells me he has not drank in the past few days, does not plan to start back.  ? ?Patient does inquire about lung nodules present on chest xray in February as he states no one has addressed this at all. He is a previous smoker.  ? ?NSAID use: using diclofenac BID  ?Social hx: states he was previously drinking heavier, maybe a few pints of liquor per week, has not drank since his last ED visit a few days ago ?Fam hx: no crc  ? ?Last Colonoscopy:never ?Last  Endoscopy:03/03/21 - Food in the middle third of the esophagus.  ?  Removal was successful.                    ?- Mildly severe esophagitis with no bleeding. ? - Normal stomach. ?- Normal examined duodenum.                                                                         ? ?Recommendations:  ?Repeat egd in 6 weeks ? ?Past Medical History:  ?Diagnosis Date  ? Anxiety   ? Arthritis   ? Bipolar disorder (HCC)   ? Depression   ? Hyperlipidemia   ? Hypertension   ? Substance abuse (HCC)   ? recovered - alcohol 2015, drugs 2012  ? ? ?Past Surgical History:  ?Procedure Laterality Date  ? BACK SURGERY  2012  ? CARPAL TUNNEL RELEASE Left   ? CARPAL TUNNEL RELEASE Right 10/03/2020  ? Procedure: RIGHT CARPAL TUNNEL RELEASE;  Surgeon: Ostergard, Thomas A, MD;  Location: [facility];  Service: Neurosurgery;  Laterality: Right;  ? ESOPHAGOGASTRODUODENOSCOPY (EGD) WITH PROPOFOL N/A 03/03/2021  ? Procedure: ESOPHAGOGASTRODUODENOSCOPY (EGD) WITH PROPOFOL;  Surgeon: Castaneda Mayorga, Daniel, MD;  [facility];  Service: Gastroenterology;  Laterality: N/A;  ?   EXCISION MASS UPPER EXTREMETIES Left 08/22/2017  ? Procedure: LEFT THUMB EXCISION MASS NAILBED WITH DEBRIDEMENT OF INTERPHALANGEAL JOINT;  Surgeon: Kuzma, Gary, MD;  Location: [facility];  Service: Orthopedics;  Laterality: Left;  ? FRACTURE SURGERY    ? right hand  ? HAND SURGERY  1988  ? SPINE SURGERY    ? , and lumbar decompression  ? TONSILLECTOMY AND ADENOIDECTOMY    ? TOTAL HIP ARTHROPLASTY Right 04/03/2019  ? Procedure: RIGHT TOTAL HIP ARTHROPLASTY ANTERIOR APPROACH;  Surgeon: Blackman, Christopher Y, MD;  Location: [facility];  Service: Orthopedics;  Laterality: Right;  ? TRANSFORAMINAL LUMBAR INTERBODY FUSION (TLIF) WITH PEDICLE SCREW FIXATION 3 LEVEL N/A 10/03/2020  ? Procedure: Lumbar three-four, Lumbar four-five, Lumbar five-Sacral one open decompression with transforaminal lumbar interbody fusion, posterolateral instrumented fusion;  Surgeon:  Ostergard, Thomas A, MD;  Location: [facility];  Service: Neurosurgery;  Laterality: N/A;  ? ? ?Current Outpatient Medications  ?Medication Sig Dispense Refill  ? acetaminophen (TYLENOL) 500 MG tablet Take 1,500 mg by mouth in the morning, at noon, and at bedtime.    ? atorvastatin (LIPITOR) 40 MG tablet Take 1 tablet (40 mg total) by mouth daily. (Patient taking differently: Take 40 mg by mouth every evening.) 90 tablet 3  ? cyclobenzaprine (FLEXERIL) 10 MG tablet Take 1 tablet (10 mg total) by mouth 3 (three) times daily as needed for muscle spasms. 30 tablet 0  ? diclofenac (VOLTAREN) 75 MG EC tablet Take 75 mg by mouth 2 (two) times daily.     ? lisinopril (ZESTRIL) 5 MG tablet Take 5 mg by mouth daily.    ? magnesium oxide (MAG-OX) 400 MG tablet Take 400 mg by mouth at bedtime.    ? Melatonin 10 MG TABS Take 10 mg by mouth at bedtime.     ? Omega-3 Fatty Acids (FISH OIL) 1000 MG CAPS Take 1,000 mg by mouth in the morning, at noon, and at bedtime.    ? omeprazole (PRILOSEC) 40 MG capsule Take 1 capsule (40 mg total) by mouth daily. 180 capsule 0  ? Potassium 99 MG TABS Take 99 mg by mouth in the morning.    ? QUEtiapine (SEROQUEL) 50 MG tablet Take 150 mg by mouth at bedtime.    ? traMADol (ULTRAM) 50 MG tablet TAKE 1 TO 2 TABLETS BY MOUTH EVERY 4 TO 6 HOURS AS NEEDED . DO NOT EXCEED 8 PER 24 HOURS as needed for pain    ? ?No current facility-administered medications for this visit.  ? ? ?Allergies as of 04/13/2021  ? (No Known Allergies)  ? ? ?Family History  ?Problem Relation Age of Onset  ? Arthritis Mother   ? Cancer Father   ?     barretts esophagus  ? Depression Father   ? Diabetes Father   ? Hyperlipidemia Father   ? Hypertension Father   ? Stroke Maternal Grandmother   ? Heart disease Maternal Grandfather 65  ? Dementia Paternal Grandmother   ? Diabetes Paternal Grandfather   ? Depression Maternal Aunt   ? Alcohol abuse Maternal Aunt   ? Alcohol abuse Maternal Uncle   ? Drug abuse Paternal Uncle   ? ? ?Social  History  ? ?Socioeconomic History  ? Marital status: Divorced  ?  Spouse name: Not on file  ? Number of children: 3  ? Years of education: 13  ? Highest education level: Not on file  ?Occupational History  ? Occupation: sheet metal fabrication  ?Tobacco Use  ?   Smoking status: Former  ?  Types: Cigarettes  ?  Quit date: 04/23/2011  ?  Years since quitting: 9.9  ?  Passive exposure: Past  ? Smokeless tobacco: Never  ? Tobacco comments:  ?  quit smoking 6 years ago  ?Vaping Use  ? Vaping Use: Never used  ?Substance and Sexual Activity  ? Alcohol use: Not Currently  ?  Comment: occasionally/ been through rehab  ? Drug use: Not Currently  ?  Types: "Crack" cocaine  ?  Comment: relapse w/ crack 11/2010; as of 08/05/20, pt reports last use of cocaine was 04/2011  ? Sexual activity: Not Currently  ?  Birth control/protection: None  ?Other Topics Concern  ? Not on file  ?Social History Narrative  ? Divorced  ? custody of two daughters  ? Recovered drug and alcohol  ? ?Social Determinants of Health  ? ?Financial Resource Strain: Not on file  ?Food Insecurity: Not on file  ?Transportation Needs: Not on file  ?Physical Activity: Not on file  ?Stress: Not on file  ?Social Connections: Not on file  ? ?Review of systems ?General: negative for malaise, night sweats, fever, chills, weight loss ?Neck: Negative for lumps, goiter, pain and significant neck swelling ?Resp: Negative for cough, wheezing, dyspnea at rest ?CV: Negative for chest pain, leg swelling, palpitations, orthopnea ?GI: denies melena, hematochezia, nausea, vomiting, diarrhea, constipation, odynophagia, early satiety or unintentional weight loss.  ?MSK: Negative for joint pain or swelling, back pain, and muscle pain. ?Derm: Negative for itching or rash ?Psych: Denies depression, anxiety, memory loss, confusion. No homicidal or suicidal ideation.  ?Heme: Negative for prolonged bleeding, bruising easily, and swollen nodes. ?Endocrine: Negative for cold or heat intolerance,  polyuria, polydipsia and goiter. ?Neuro: negative for tremor, gait imbalance, syncope and seizures. ?The remainder of the review of systems is noncontributory. ? ?Physical Exam: ?BP 137/81 (BP Location: L

## 2021-04-13 NOTE — Patient Instructions (Signed)
We will get you scheduled for repeat EGD ?Continue omeprazole 40mg  once daily ?Continue to avoid alcohol and other NSAIDs (advil, aleve, naproxen, goody powder, ibuprofen) as these can be very hard on your GI tract, causing inflammation, ulcers and damage to the lining of your GI tract.  ?Make sure you are chewing thoroughly, taking small bites and sips of liquids between, please avoid thicker, dryer foods that tend to not go down as well.  ? ?

## 2021-04-13 NOTE — Progress Notes (Signed)
? ?Referring Provider: Cory Munch, PA-C ?Primary Care Physician:  Cory Munch, PA-C ?Primary GI Physician: castaneda ? ?Chief Complaint  ?Patient presents with  ? Hospitalization Follow-up  ?  ED follow up. Getting choked on dry food. Concerned about results of chest xray results.   ? ?HPI:   ?Tim [name] is a 56 y.o. male with past medical history of anxiety, arthritis, Bipolar disorder, depression, hLD, HTN, substance abuse (alcohol and drugs). ? ?Patient presenting today for follow up after ED visit for food impaction in February. ? ?Patient presented to ED on 03/03/21 with food impaction after eating short ribs the day prior, had not been able to swallow since 2pm the day before. Felt persistent discomfort in midsternal area. Underwent EGD at that time with Dr. Jenetta Downer with food in middle third of esophagus, successfully removed. Mildly severe esophagitis with no bleeding, otherwise normal exam. Recommended repeat EGD 6 weeks after. Started on omeprazole 40mg  daily  ? ?He reports 2 episodes of dysphagia since EGD in February. This occurred with tuna, though he was able to get food bolus to pass with drinking a lot of water. Continues to take omeprazole daily. He denies any issues with heartburn or acid regurgitation. Denies any odynophagia. No changes in appetite, weight loss, odynophagia, nausea, vomiting, rectal bleeding or melena. No issues with constipation or diarrhea. Takes diclofenac 75mg  BID everyday and has hx of alcohol abuse, though tells me he has not drank in the past few days, does not plan to start back.  ? ?Patient does inquire about lung nodules present on chest xray in February as he states no one has addressed this at all. He is a previous smoker.  ? ?NSAID use: using diclofenac BID  ?Social hx: states he was previously drinking heavier, maybe a few pints of liquor per week, has not drank since his last ED visit a few days ago ?Fam hx: no crc  ? ?Last Colonoscopy:never ?Last  Endoscopy:03/03/21 - Food in the middle third of the esophagus.  ?  Removal was successful.                    ?- Mildly severe esophagitis with no bleeding. ? - Normal stomach. ?- Normal examined duodenum.                                                                         ? ?Recommendations:  ?Repeat egd in 6 weeks ? ?Past Medical History:  ?Diagnosis Date  ? Anxiety   ? Arthritis   ? Bipolar disorder ([place])   ? Depression   ? Hyperlipidemia   ? Hypertension   ? Substance abuse ([place])   ? recovered - alcohol 2015, drugs 2012  ? ? ?Past Surgical History:  ?Procedure Laterality Date  ? BACK SURGERY  2012  ? CARPAL TUNNEL RELEASE Left   ? CARPAL TUNNEL RELEASE Right 10/03/2020  ? Procedure: RIGHT CARPAL TUNNEL RELEASE;  Surgeon: Judith Part, MD;  Location: [facility];  Service: Neurosurgery;  Laterality: Right;  ? ESOPHAGOGASTRODUODENOSCOPY (EGD) WITH PROPOFOL N/A 03/03/2021  ? Procedure: ESOPHAGOGASTRODUODENOSCOPY (EGD) WITH PROPOFOL;  Surgeon: Harvel Quale, MD;  [facility];  Service: Gastroenterology;  Laterality: N/A;  ?  EXCISION MASS UPPER EXTREMETIES Left 08/22/2017  ? Procedure: LEFT THUMB EXCISION MASS NAILBED WITH DEBRIDEMENT OF INTERPHALANGEAL JOINT;  Surgeon: Daryll Brod, MD;  Location: [facility];  Service: Orthopedics;  Laterality: Left;  ? FRACTURE SURGERY    ? right hand  ? HAND SURGERY  1988  ? SPINE SURGERY    ? , and lumbar decompression  ? TONSILLECTOMY AND ADENOIDECTOMY    ? TOTAL HIP ARTHROPLASTY Right 04/03/2019  ? Procedure: RIGHT TOTAL HIP ARTHROPLASTY ANTERIOR APPROACH;  Surgeon: Mcarthur Rossetti, MD;  Location: [facility];  Service: Orthopedics;  Laterality: Right;  ? TRANSFORAMINAL LUMBAR INTERBODY FUSION (TLIF) WITH PEDICLE SCREW FIXATION 3 LEVEL N/A 10/03/2020  ? Procedure: Lumbar three-four, Lumbar four-five, Lumbar five-Sacral one open decompression with transforaminal lumbar interbody fusion, posterolateral instrumented fusion;  Surgeon:  Judith Part, MD;  Location: [facility];  Service: Neurosurgery;  Laterality: N/A;  ? ? ?Current Outpatient Medications  ?Medication Sig Dispense Refill  ? acetaminophen (TYLENOL) 500 MG tablet Take 1,500 mg by mouth in the morning, at noon, and at bedtime.    ? atorvastatin (LIPITOR) 40 MG tablet Take 1 tablet (40 mg total) by mouth daily. (Patient taking differently: Take 40 mg by mouth every evening.) 90 tablet 3  ? cyclobenzaprine (FLEXERIL) 10 MG tablet Take 1 tablet (10 mg total) by mouth 3 (three) times daily as needed for muscle spasms. 30 tablet 0  ? diclofenac (VOLTAREN) 75 MG EC tablet Take 75 mg by mouth 2 (two) times daily.     ? lisinopril (ZESTRIL) 5 MG tablet Take 5 mg by mouth daily.    ? magnesium oxide (MAG-OX) 400 MG tablet Take 400 mg by mouth at bedtime.    ? Melatonin 10 MG TABS Take 10 mg by mouth at bedtime.     ? Omega-3 Fatty Acids (FISH OIL) 1000 MG CAPS Take 1,000 mg by mouth in the morning, at noon, and at bedtime.    ? omeprazole (PRILOSEC) 40 MG capsule Take 1 capsule (40 mg total) by mouth daily. 180 capsule 0  ? Potassium 99 MG TABS Take 99 mg by mouth in the morning.    ? QUEtiapine (SEROQUEL) 50 MG tablet Take 150 mg by mouth at bedtime.    ? traMADol (ULTRAM) 50 MG tablet TAKE 1 TO 2 TABLETS BY MOUTH EVERY 4 TO 6 HOURS AS NEEDED . DO NOT EXCEED 8 PER 24 HOURS as needed for pain    ? ?No current facility-administered medications for this visit.  ? ? ?Allergies as of 04/13/2021  ? (No Known Allergies)  ? ? ?Family History  ?Problem Relation Age of Onset  ? Arthritis Mother   ? Cancer Father   ?     barretts esophagus  ? Depression Father   ? Diabetes Father   ? Hyperlipidemia Father   ? Hypertension Father   ? Stroke Maternal Grandmother   ? Heart disease Maternal Grandfather 3  ? Dementia Paternal Grandmother   ? Diabetes Paternal Grandfather   ? Depression Maternal Aunt   ? Alcohol abuse Maternal Aunt   ? Alcohol abuse Maternal Uncle   ? Drug abuse Paternal Uncle   ? ? ?Social  History  ? ?Socioeconomic History  ? Marital status: Divorced  ?  Spouse name: Not on file  ? Number of children: 3  ? Years of education: 36  ? Highest education level: Not on file  ?Occupational History  ? Occupation: sheet metal fabrication  ?Tobacco Use  ?  Smoking status: Former  ?  Types: Cigarettes  ?  Quit date: 04/23/2011  ?  Years since quitting: 9.9  ?  Passive exposure: Past  ? Smokeless tobacco: Never  ? Tobacco comments:  ?  quit smoking 6 years ago  ?Vaping Use  ? Vaping Use: Never used  ?Substance and Sexual Activity  ? Alcohol use: Not Currently  ?  Comment: occasionally/ been through rehab  ? Drug use: Not Currently  ?  Types: "Crack" cocaine  ?  Comment: relapse w/ crack 11/2010; as of 08/05/20, pt reports last use of cocaine was 04/2011  ? Sexual activity: Not Currently  ?  Birth control/protection: None  ?Other Topics Concern  ? Not on file  ?Social History Narrative  ? Divorced  ? custody of two daughters  ? Recovered drug and alcohol  ? ?Social Determinants of Health  ? ?Financial Resource Strain: Not on file  ?Food Insecurity: Not on file  ?Transportation Needs: Not on file  ?Physical Activity: Not on file  ?Stress: Not on file  ?Social Connections: Not on file  ? ?Review of systems ?General: negative for malaise, night sweats, fever, chills, weight loss ?Neck: Negative for lumps, goiter, pain and significant neck swelling ?Resp: Negative for cough, wheezing, dyspnea at rest ?CV: Negative for chest pain, leg swelling, palpitations, orthopnea ?GI: denies melena, hematochezia, nausea, vomiting, diarrhea, constipation, odynophagia, early satiety or unintentional weight loss.  ?MSK: Negative for joint pain or swelling, back pain, and muscle pain. ?Derm: Negative for itching or rash ?Psych: Denies depression, anxiety, memory loss, confusion. No homicidal or suicidal ideation.  ?Heme: Negative for prolonged bleeding, bruising easily, and swollen nodes. ?Endocrine: Negative for cold or heat intolerance,  polyuria, polydipsia and goiter. ?Neuro: negative for tremor, gait imbalance, syncope and seizures. ?The remainder of the review of systems is noncontributory. ? ?Physical Exam: ?BP 137/81 (BP Location: L

## 2021-04-17 ENCOUNTER — Encounter (INDEPENDENT_AMBULATORY_CARE_PROVIDER_SITE_OTHER): Payer: Self-pay

## 2021-04-25 NOTE — Patient Instructions (Signed)
? ? ? ? ? ? ? ? Tim [name] ? 04/25/2021  ?  ? @PREFPERIOPPHARMACY @ ? ? Your procedure is scheduled on  05/02/2021. ? ? Report to [facility] at  1230 P.M. ? ? Call this number if you have problems the morning of surgery: ? [phone] ? ? Remember: ? Follow the diet and prep instructions given to you by the office. ?  ? Take these medicines the morning of surgery with A SIP OF WATER  ? ?flexeril(if needed), voltaren, prilosec, tramadol(if needed) ?  ? Do not wear jewelry, make-up or nail polish. ? Do not wear lotions, powders, or perfumes, or deodorant. ? Do not shave 48 hours prior to surgery.  Men may shave face and neck. ? Do not bring valuables to the hospital. ?  is not responsible for any belongings or valuables. ? ?Contacts, dentures or bridgework may not be worn into surgery.  Leave your suitcase in the car.  After surgery it may be brought to your room. ? ?For patients admitted to the hospital, discharge time will be determined by your treatment team. ? ?Patients discharged the day of surgery will not be allowed to drive home and must have someone with them for 24 hours.  ? ? ?Special instructions:   DO NOT smoke tobacco or vape for 24 hours before your procedure. ? ?Please read over the following fact sheets that you were given. ?Anesthesia Post-op Instructions and Care and Recovery After Surgery ?  ? ? ? Upper Endoscopy, Adult, Care After ?This sheet gives you information about how to care for yourself after your procedure. Your health care provider may also give you more specific instructions. If you have problems or questions, contact your health care provider. ?What can I expect after the procedure? ?After the procedure, it is common to have: ?A sore throat. ?Mild stomach pain or discomfort. ?Bloating. ?Nausea. ?Follow these instructions at home: ? ?Follow instructions from your health care provider about what to eat or drink after your procedure. ?Return to your normal activities as told  by your health care provider. Ask your health care provider what activities are safe for you. ?Take over-the-counter and prescription medicines only as told by your health care provider. ?If you were given a sedative during the procedure, it can affect you for several hours. Do not drive or operate machinery until your health care provider says that it is safe. ?Keep all follow-up visits as told by your health care provider. This is important. ?Contact a health care provider if you have: ?A sore throat that lasts longer than one day. ?Trouble swallowing. ?Get help right away if: ?You vomit blood or your vomit looks like coffee grounds. ?You have: ?A fever. ?Bloody, black, or tarry stools. ?A severe sore throat or you cannot swallow. ?Difficulty breathing. ?Severe pain in your chest or abdomen. ?Summary ?After the procedure, it is common to have a sore throat, mild stomach discomfort, bloating, and nausea. ?If you were given a sedative during the procedure, it can affect you for several hours. Do not drive or operate machinery until your health care provider says that it is safe. ?Follow instructions from your health care provider about what to eat or drink after your procedure. ?Return to your normal activities as told by your health care provider. ?This information is not intended to replace advice given to you by your health care provider. Make sure you discuss any questions you have with your health care provider. ?Document Revised: 11/14/2018  Document Reviewed: 06/10/2017 ?Elsevier Patient Education ? [place]. ?Monitored Anesthesia Care, Care After ?This sheet gives you information about how to care for yourself after your procedure. Your health care provider may also give you more specific instructions. If you have problems or questions, contact your health care provider. ?What can I expect after the procedure? ?After the procedure, it is common to have: ?Tiredness. ?Forgetfulness about what happened  after the procedure. ?Impaired judgment for important decisions. ?Nausea or vomiting. ?Some difficulty with balance. ?Follow these instructions at home: ?For the time period you were told by your health care provider: ?  ?Rest as needed. ?Do not participate in activities where you could fall or become injured. ?Do not drive or use machinery. ?Do not drink alcohol. ?Do not take sleeping pills or medicines that cause drowsiness. ?Do not make important decisions or sign legal documents. ?Do not take care of children on your own. ?Eating and drinking ?Follow the diet that is recommended by your health care provider. ?Drink enough fluid to keep your urine pale yellow. ?If you vomit: ?Drink water, juice, or soup when you can drink without vomiting. ?Make sure you have little or no nausea before eating solid foods. ?General instructions ?Have a responsible adult stay with you for the time you are told. It is important to have someone help care for you until you are awake and alert. ?Take over-the-counter and prescription medicines only as told by your health care provider. ?If you have sleep apnea, surgery and certain medicines can increase your risk for breathing problems. Follow instructions from your health care provider about wearing your sleep device: ?Anytime you are sleeping, including during daytime naps. ?While taking prescription pain medicines, sleeping medicines, or medicines that make you drowsy. ?Avoid smoking. ?Keep all follow-up visits as told by your health care provider. This is important. ?Contact a health care provider if: ?You keep feeling nauseous or you keep vomiting. ?You feel light-headed. ?You are still sleepy or having trouble with balance after 24 hours. ?You develop a rash. ?You have a fever. ?You have redness or swelling around the IV site. ?Get help right away if: ?You have trouble breathing. ?You have new-onset confusion at home. ?Summary ?For several hours after your procedure, you may feel  tired. You may also be forgetful and have poor judgment. ?Have a responsible adult stay with you for the time you are told. It is important to have someone help care for you until you are awake and alert. ?Rest as told. Do not drive or operate machinery. Do not drink alcohol or take sleeping pills. ?Get help right away if you have trouble breathing, or if you suddenly become confused. ?This information is not intended to replace advice given to you by your health care provider. Make sure you discuss any questions you have with your health care provider. ?Document Revised: 09/24/2019 Document Reviewed: 12/11/2018 ?Elsevier Patient Education ? [place]. ? ?

## 2021-04-27 ENCOUNTER — Encounter (HOSPITAL_COMMUNITY)
Admission: RE | Admit: 2021-04-27 | Discharge: 2021-04-27 | Disposition: A | Discharge status: Home / Self Care | Payer: 59 | Source: Ambulatory Visit | Attending: Gastroenterology | Admitting: Gastroenterology

## 2021-04-27 ENCOUNTER — Encounter (HOSPITAL_COMMUNITY): Payer: Self-pay

## 2021-05-02 ENCOUNTER — Ambulatory Visit (HOSPITAL_BASED_OUTPATIENT_CLINIC_OR_DEPARTMENT_OTHER): Payer: 59 | Admitting: Anesthesiology

## 2021-05-02 ENCOUNTER — Ambulatory Visit (HOSPITAL_COMMUNITY): Payer: 59 | Admitting: Anesthesiology

## 2021-05-02 ENCOUNTER — Ambulatory Visit (HOSPITAL_COMMUNITY)
Admission: RE | Admit: 2021-05-02 | Discharge: 2021-05-02 | Disposition: A | Discharge status: Home / Self Care | Payer: 59 | Attending: Gastroenterology | Admitting: Gastroenterology

## 2021-05-02 ENCOUNTER — Encounter (HOSPITAL_COMMUNITY)
Admission: RE | Disposition: A | Discharge status: Home / Self Care | Payer: Self-pay | Source: Home / Self Care | Attending: Gastroenterology

## 2021-05-02 ENCOUNTER — Encounter (HOSPITAL_COMMUNITY): Payer: Self-pay | Admitting: Gastroenterology

## 2021-05-02 ENCOUNTER — Other Ambulatory Visit: Payer: Self-pay

## 2021-05-02 DIAGNOSIS — Z87891 Personal history of nicotine dependence: Secondary | ICD-10-CM | POA: Insufficient documentation

## 2021-05-02 DIAGNOSIS — F418 Other specified anxiety disorders: Secondary | ICD-10-CM

## 2021-05-02 DIAGNOSIS — R1310 Dysphagia, unspecified: Secondary | ICD-10-CM

## 2021-05-02 DIAGNOSIS — K2970 Gastritis, unspecified, without bleeding: Secondary | ICD-10-CM

## 2021-05-02 DIAGNOSIS — Z79899 Other long term (current) drug therapy: Secondary | ICD-10-CM | POA: Diagnosis not present

## 2021-05-02 DIAGNOSIS — K222 Esophageal obstruction: Secondary | ICD-10-CM

## 2021-05-02 DIAGNOSIS — R69 Illness, unspecified: Secondary | ICD-10-CM | POA: Diagnosis not present

## 2021-05-02 DIAGNOSIS — M1990 Unspecified osteoarthritis, unspecified site: Secondary | ICD-10-CM | POA: Diagnosis not present

## 2021-05-02 DIAGNOSIS — E785 Hyperlipidemia, unspecified: Secondary | ICD-10-CM | POA: Insufficient documentation

## 2021-05-02 DIAGNOSIS — R1319 Other dysphagia: Secondary | ICD-10-CM

## 2021-05-02 DIAGNOSIS — I10 Essential (primary) hypertension: Secondary | ICD-10-CM | POA: Insufficient documentation

## 2021-05-02 DIAGNOSIS — Z791 Long term (current) use of non-steroidal anti-inflammatories (NSAID): Secondary | ICD-10-CM | POA: Insufficient documentation

## 2021-05-02 DIAGNOSIS — K319 Disease of stomach and duodenum, unspecified: Secondary | ICD-10-CM | POA: Diagnosis not present

## 2021-05-02 DIAGNOSIS — R918 Other nonspecific abnormal finding of lung field: Secondary | ICD-10-CM | POA: Insufficient documentation

## 2021-05-02 DIAGNOSIS — F419 Anxiety disorder, unspecified: Secondary | ICD-10-CM | POA: Insufficient documentation

## 2021-05-02 DIAGNOSIS — K209 Esophagitis, unspecified without bleeding: Secondary | ICD-10-CM | POA: Insufficient documentation

## 2021-05-02 DIAGNOSIS — F319 Bipolar disorder, unspecified: Secondary | ICD-10-CM | POA: Insufficient documentation

## 2021-05-02 HISTORY — PX: ESOPHAGOGASTRODUODENOSCOPY (EGD) WITH PROPOFOL: SHX5813

## 2021-05-02 HISTORY — PX: BIOPSY: SHX5522

## 2021-05-02 HISTORY — PX: SAVORY DILATION: SHX5439

## 2021-05-02 SURGERY — ESOPHAGOGASTRODUODENOSCOPY (EGD) WITH PROPOFOL
Anesthesia: General

## 2021-05-02 MED ORDER — PROPOFOL 10 MG/ML IV BOLUS
INTRAVENOUS | Status: DC | PRN
  Administered 2021-05-02 (×3): 50 mg via INTRAVENOUS
  Administered 2021-05-02: 100 mg via INTRAVENOUS
  Administered 2021-05-02 (×4): 50 mg via INTRAVENOUS
  Administered 2021-05-02: 100 mg via INTRAVENOUS

## 2021-05-02 MED ORDER — LACTATED RINGERS IV SOLN: INTRAVENOUS | Status: DC

## 2021-05-02 MED ORDER — LIDOCAINE HCL 1 % IJ SOLN
INTRAMUSCULAR | Status: DC | PRN
  Administered 2021-05-02: 50 mg via INTRADERMAL

## 2021-05-02 NOTE — Interval H&P Note (Signed)
History and Physical Interval Note: ? ?05/02/2021 ?12:27 PM ? ?Tim [name]  has presented today for surgery, with the diagnosis of Esophagitis Dysphagia.  The various methods of treatment have been discussed with the patient and family. After consideration of risks, benefits and other options for treatment, the patient has consented to  Procedure(s) with comments: ?ESOPHAGOGASTRODUODENOSCOPY (EGD) WITH PROPOFOL (N/A) - 215 ASA 1 as a surgical intervention.  The patient's history has been reviewed, patient examined, no change in status, stable for surgery.  I have reviewed the patient's chart and labs.  Questions were answered to the patient's satisfaction.   ? ? ?Katrinka Blazing Mayorga ? ? ?

## 2021-05-02 NOTE — Op Note (Signed)
[facility] ?Patient Name: Tim [name] ?Procedure Date: 05/02/2021 12:25 PM ?MRN: [id number] ?Date of Birth: 1965-09-13 ?Attending MD: Maylon Peppers ,  ?CSN: [id number] ?Age: 56 ?Admit Type: Outpatient ?Procedure:                Upper GI endoscopy ?Indications:              Dysphagia, follow up food impaction ?Providers:                Maylon Peppers, Hughie Closs RN, RN, Caprice Kluver,  ?                          Angela A. Insurance claims handler, Therapist, sports, Suzan Garibaldi. [facility],  ?                          Technician, Thomas Hoff., Technician ?Referring MD:              ?Medicines:                Monitored Anesthesia Care ?Complications:            No immediate complications. ?Estimated Blood Loss:     Estimated blood loss: none. ?Procedure:                Pre-Anesthesia Assessment: ?                          - Prior to the procedure, a History and Physical  ?                          was performed, and patient medications, allergies  ?                          and sensitivities were reviewed. The patient's  ?                          tolerance of previous anesthesia was reviewed. ?                          - The risks and benefits of the procedure and the  ?                          sedation options and risks were discussed with the  ?                          patient. All questions were answered and informed  ?                          consent was obtained. ?                          - ASA Grade Assessment: II - A patient with mild  ?                          systemic disease. ?                          After  obtaining informed consent, the endoscope was  ?                          passed under direct vision. Throughout the  ?                          procedure, the patient's blood pressure, pulse, and  ?                          oxygen saturations were monitored continuously. The  ?                          [id number]) scope was introduced through the  ?                          mouth, and advanced to the  second part of duodenum.  ?                          The upper GI endoscopy was accomplished without  ?                          difficulty. The patient tolerated the procedure  ?                          well. ?Scope In: 12:48:19 PM ?Scope Out: 1:03:51 PM ?Total Procedure Duration: 0 hours 15 minutes 32 seconds  ?Findings: ?     A widely patent and non-obstructing Schatzki ring was found in the lower  ?     third of the esophagus. A guidewire was placed and the scope was  ?     withdrawn. Dilation was performed with a Savary dilator with no  ?     resistance at 18 mm. No mucosal disruption was seen upon reinspection.  ?     Due to this, I proceeded to disrupt the ring with a cold forceps.  ?     Biopsies were obtained from the proximal and distal esophagus with cold  ?     forceps for histology of eosinophilic esophagitis. ?     Diffuse mild inflammation characterized by erosions and erythema was  ?     found in the gastric antrum. Biopsies were taken with a cold forceps for  ?     Helicobacter pylori testing. ?     The examined duodenum was normal. ?Impression:               - Widely patent and non-obstructing Schatzki ring.  ?                          Dilated. Biopsied. ?                          - Gastritis. Biopsied. ?                          - Normal examined duodenum. ?Moderate Sedation: ?     Per Anesthesia Care ?Recommendation:           - Discharge patient to home (ambulatory). ?                          -  Resume previous diet. ?                          - Await pathology results. ?                          - Continue present medications, including  ?                          omeprazole 40 mg every day (take 30 minutes before  ?                          breakfast). ?Procedure Code(s):        --- Professional --- ?                          [phone], Esophagogastroduodenoscopy, flexible,  ?                          transoral; with insertion of guide wire followed by  ?                          passage of  dilator(s) through esophagus over guide  ?                          wire ?                          [id number], 59, Esophagogastroduodenoscopy, flexible,  ?                          transoral; with biopsy, single or multiple ?Diagnosis Code(s):        --- Professional --- ?                          K22.2, Esophageal obstruction ?                          K29.70, Gastritis, unspecified, without bleeding ?                          R13.10, Dysphagia, unspecified ?CPT copyright 2019 [employer]. All rights reserved. ?The codes documented in this report are preliminary and upon coder review may  ?be revised to meet current compliance requirements. ?Maylon Peppers, MD ?Maylon Peppers,  ?05/02/2021 1:12:18 PM ?This report has been signed electronically. ?Number of Addenda: 0 ?

## 2021-05-02 NOTE — Discharge Instructions (Signed)
You are being discharged to home.  ?Resume your previous diet.  ?We are waiting for your pathology results.  ?Continue present medications, including omeprazole 40 mg every day (take 30 minutes before breakfast).  ?

## 2021-05-02 NOTE — Anesthesia Postprocedure Evaluation (Signed)
Anesthesia Post Note ? ?Patient: Tim [name] ? ?Procedure(s) Performed: ESOPHAGOGASTRODUODENOSCOPY (EGD) WITH PROPOFOL ?SAVORY DILATION ?BIOPSY ? ?Patient location during evaluation: Phase II ?Anesthesia Type: General ?Level of consciousness: awake and alert and oriented ?Pain management: pain level controlled ?Vital Signs Assessment: post-procedure vital signs reviewed and stable ?Respiratory status: spontaneous breathing, nonlabored ventilation and respiratory function stable ?Cardiovascular status: blood pressure returned to baseline and stable ?Postop Assessment: no apparent nausea or vomiting ?Anesthetic complications: no ? ? ?No notable events documented. ? ? ?Last Vitals:  ?Vitals:  ? 05/02/21 1235 05/02/21 1307  ?BP: 135/73 (!) 141/80  ?Pulse: (!) 105 (!) 107  ?Resp: 18 19  ?Temp: 37 ?C 36.8 ?C  ?SpO2: 99% 99%  ?  ?Last Pain:  ?Vitals:  ? 05/02/21 1307  ?TempSrc: Oral  ?PainSc: Asleep  ? ? ?  ?  ?  ?  ?  ?  ? ?Nerida Boivin C Robson Trickey ? ? ? ? ?

## 2021-05-02 NOTE — Anesthesia Preprocedure Evaluation (Signed)
Anesthesia Evaluation  ?Patient identified by MRN, date of birth, ID band ?Patient awake ? ? ? ?Reviewed: ?Allergy & Precautions, NPO status , Patient's Chart, lab work & pertinent test results ? ?History of Anesthesia Complications ?Negative for: history of anesthetic complications ? ?Airway ?Mallampati: II ? ?TM Distance: >3 FB ?Neck ROM: Full ? ? ? Dental ? ?(+) Dental Advisory Given, Missing, Poor Dentition, Chipped ?  ?Pulmonary ?neg pulmonary ROS, former smoker,  ?  ?Pulmonary exam normal ?breath sounds clear to auscultation ? ? ? ? ? ? Cardiovascular ?Exercise Tolerance: Poor ?hypertension, Pt. on medications ?Normal cardiovascular exam ?Rhythm:Regular Rate:Normal ? ? ?  ?Neuro/Psych ? Headaches, PSYCHIATRIC DISORDERS Anxiety Depression Bipolar Disorder   ? GI/Hepatic ?negative GI ROS, (+)  ?  ? substance abuse ? alcohol use and cocaine use,   ?Endo/Other  ?negative endocrine ROS ? Renal/GU ?negative Renal ROS  ?negative genitourinary ?  ?Musculoskeletal ? ?(+) Arthritis , Osteoarthritis,   ? Abdominal ?  ?Peds ?negative pediatric ROS ?(+)  Hematology ?negative hematology ROS ?(+)   ?Anesthesia Other Findings ? ? Reproductive/Obstetrics ?negative OB ROS ? ?  ? ? ? ? ? ? ? ? ? ? ? ? ? ?  ?  ? ? ? ? ? ? ? ? ?Anesthesia Physical ?Anesthesia Plan ? ?ASA: 3 ? ?Anesthesia Plan: General  ? ?Post-op Pain Management: Minimal or no pain anticipated  ? ?Induction: Intravenous ? ?PONV Risk Score and Plan: Propofol infusion and TIVA ? ?Airway Management Planned: Nasal Cannula and Natural Airway ? ?Additional Equipment:  ? ?Intra-op Plan:  ? ?Post-operative Plan:  ? ?Informed Consent: I have reviewed the patients History and Physical, chart, labs and discussed the procedure including the risks, benefits and alternatives for the proposed anesthesia with the patient or authorized representative who has indicated his/her understanding and acceptance.  ? ? ? ?Dental advisory given ? ?Plan  Discussed with: CRNA and Surgeon ? ?Anesthesia Plan Comments:   ? ? ? ? ? ? ?Anesthesia Quick Evaluation ? ?

## 2021-05-02 NOTE — Transfer of Care (Signed)
Immediate Anesthesia Transfer of Care Note ? ?Patient: Tim [name] ? ?Procedure(s) Performed: ESOPHAGOGASTRODUODENOSCOPY (EGD) WITH PROPOFOL ?SAVORY DILATION ?BIOPSY ? ?Patient Location: Short Stay ? ?Anesthesia Type:General ? ?Level of Consciousness: awake ? ?Airway & Oxygen Therapy: Patient Spontanous Breathing ? ?Post-op Assessment: Report given to RN, Post -op Vital signs reviewed and stable and Patient moving all extremities X 4 ? ?Post vital signs: Reviewed and stable ? ?Last Vitals:  ?Vitals Value Taken Time  ?BP    ?Temp    ?Pulse    ?Resp    ?SpO2    ? ? ?Last Pain:  ?Vitals:  ? 05/02/21 1242  ?TempSrc:   ?PainSc: 5   ?   ? ?Patients Stated Pain Goal: 7 (05/02/21 1235) ? ?Complications: No notable events documented. ?

## 2021-05-04 LAB — SURGICAL PATHOLOGY

## 2021-05-05 ENCOUNTER — Encounter (HOSPITAL_COMMUNITY): Payer: Self-pay | Admitting: Gastroenterology

## 2021-05-08 ENCOUNTER — Encounter (INDEPENDENT_AMBULATORY_CARE_PROVIDER_SITE_OTHER): Payer: Self-pay | Admitting: *Deleted

## 2021-05-10 ENCOUNTER — Other Ambulatory Visit (HOSPITAL_COMMUNITY): Payer: 59

## 2021-06-23 DIAGNOSIS — I10 Essential (primary) hypertension: Secondary | ICD-10-CM | POA: Diagnosis not present

## 2021-06-23 DIAGNOSIS — Z6834 Body mass index (BMI) 34.0-34.9, adult: Secondary | ICD-10-CM | POA: Diagnosis not present

## 2021-06-23 DIAGNOSIS — M25511 Pain in right shoulder: Secondary | ICD-10-CM | POA: Diagnosis not present

## 2021-06-23 DIAGNOSIS — E6609 Other obesity due to excess calories: Secondary | ICD-10-CM | POA: Diagnosis not present

## 2021-06-23 DIAGNOSIS — M1991 Primary osteoarthritis, unspecified site: Secondary | ICD-10-CM | POA: Diagnosis not present

## 2021-08-04 ENCOUNTER — Ambulatory Visit (HOSPITAL_COMMUNITY)
Admission: RE | Admit: 2021-08-04 | Discharge: 2021-08-04 | Disposition: A | Discharge status: Home / Self Care | Payer: 59 | Source: Ambulatory Visit | Attending: Family Medicine | Admitting: Family Medicine

## 2021-08-04 ENCOUNTER — Other Ambulatory Visit (HOSPITAL_COMMUNITY): Payer: Self-pay | Admitting: Family Medicine

## 2021-08-04 DIAGNOSIS — M25511 Pain in right shoulder: Secondary | ICD-10-CM | POA: Diagnosis not present

## 2021-08-04 DIAGNOSIS — M19011 Primary osteoarthritis, right shoulder: Secondary | ICD-10-CM | POA: Diagnosis not present

## 2021-08-04 DIAGNOSIS — Z6834 Body mass index (BMI) 34.0-34.9, adult: Secondary | ICD-10-CM | POA: Diagnosis not present

## 2021-08-04 DIAGNOSIS — E6609 Other obesity due to excess calories: Secondary | ICD-10-CM | POA: Diagnosis not present

## 2021-08-16 ENCOUNTER — Ambulatory Visit (HOSPITAL_COMMUNITY): Payer: 59 | Attending: Family Medicine

## 2021-08-16 ENCOUNTER — Encounter (HOSPITAL_COMMUNITY): Payer: Self-pay

## 2021-08-16 DIAGNOSIS — G8929 Other chronic pain: Secondary | ICD-10-CM | POA: Diagnosis not present

## 2021-08-16 DIAGNOSIS — M25611 Stiffness of right shoulder, not elsewhere classified: Secondary | ICD-10-CM | POA: Diagnosis not present

## 2021-08-16 DIAGNOSIS — R29898 Other symptoms and signs involving the musculoskeletal system: Secondary | ICD-10-CM | POA: Insufficient documentation

## 2021-08-16 DIAGNOSIS — M25511 Pain in right shoulder: Secondary | ICD-10-CM | POA: Insufficient documentation

## 2021-08-16 NOTE — Patient Instructions (Signed)

## 2021-08-16 NOTE — Therapy (Signed)
OUTPATIENT OCCUPATIONAL THERAPY ORTHO EVALUATION  Patient Name: Tim [name] MRN: [id number] DOB:[birth year], 56 y.o., male Today's Date: 08/16/2021  PCP: Lenise Herald, PA-C REFERRING PROVIDER: Terie Purser, PA-C   OT End of Session - 08/16/21 1518     Visit Number 1    Number of Visits 13    Date for OT Re-Evaluation 09/29/21    Authorization Type [employer]    Authorization Time Period visit limit 35, 1 used    OT Start Time 1345    OT Stop Time 1425    OT Time Calculation (min) 40 min    Activity Tolerance Patient tolerated treatment well    Behavior During Therapy WFL for tasks assessed/performed             Past Medical History:  Diagnosis Date   Anxiety    Arthritis    Bipolar disorder (HCC)    Depression    Hyperlipidemia    Hypertension    Substance abuse (HCC)    recovered - alcohol 2015, drugs 2012   Past Surgical History:  Procedure Laterality Date   BACK SURGERY  2012   BIOPSY  05/02/2021   Procedure: BIOPSY;  Surgeon: Dolores Frame, MD;  Location: [facility];  Service: Gastroenterology;;   CARPAL TUNNEL RELEASE Left    CARPAL TUNNEL RELEASE Right 10/03/2020   Procedure: RIGHT CARPAL TUNNEL RELEASE;  Surgeon: Jadene Pierini, MD;  Location: [facility];  Service: Neurosurgery;  Laterality: Right;   ESOPHAGOGASTRODUODENOSCOPY (EGD) WITH PROPOFOL N/A 03/03/2021   Procedure: ESOPHAGOGASTRODUODENOSCOPY (EGD) WITH PROPOFOL;  Surgeon: Dolores Frame, MD;  Location: [facility];  Service: Gastroenterology;  Laterality: N/A;   ESOPHAGOGASTRODUODENOSCOPY (EGD) WITH PROPOFOL N/A 05/02/2021   Procedure: ESOPHAGOGASTRODUODENOSCOPY (EGD) WITH PROPOFOL;  Surgeon: Dolores Frame, MD;  Location: [facility];  Service: Gastroenterology;  Laterality: N/A;  215 ASA 1   EXCISION MASS UPPER EXTREMETIES Left 08/22/2017   Procedure: LEFT THUMB EXCISION MASS NAILBED WITH DEBRIDEMENT OF INTERPHALANGEAL JOINT;  Surgeon: Cindee Salt, MD;  Location: [facility];  Service: Orthopedics;  Laterality: Left;   FRACTURE SURGERY     right hand   HAND SURGERY  1988   SAVORY DILATION  05/02/2021   Procedure: SAVORY DILATION;  Surgeon: Dolores Frame, MD;  Location: [facility];  Service: Gastroenterology;;   SPINE SURGERY     , and lumbar decompression   TONSILLECTOMY AND ADENOIDECTOMY     TOTAL HIP ARTHROPLASTY Right 04/03/2019   Procedure: RIGHT TOTAL HIP ARTHROPLASTY ANTERIOR APPROACH;  Surgeon: Kathryne Hitch, MD;  Location: [facility];  Service: Orthopedics;  Laterality: Right;   TRANSFORAMINAL LUMBAR INTERBODY FUSION (TLIF) WITH PEDICLE SCREW FIXATION 3 LEVEL N/A 10/03/2020   Procedure: Lumbar three-four, Lumbar four-five, Lumbar five-Sacral one open decompression with transforaminal lumbar interbody fusion, posterolateral instrumented fusion;  Surgeon: Jadene Pierini, MD;  Location: [facility];  Service: Neurosurgery;  Laterality: N/A;   Patient Active Problem List   Diagnosis Date Noted   Esophageal dysphagia 04/13/2021   Acute esophagitis 04/13/2021   NSAID long-term use 04/13/2021   Spondylolisthesis of lumbar region 10/03/2020   Unilateral primary osteoarthritis, right hip 04/03/2019   Status post total replacement of right hip 04/03/2019   Morbid obesity with BMI of 40.0-44.9, adult (HCC) 07/14/2017   Mood disorder in conditions classified elsewhere 02/14/2017   Alcohol abuse 02/14/2017   Cocaine use disorder, severe, in sustained remission (HCC) 02/14/2017   Chronic back pain greater than  3 months duration 01/24/2017   Migraine headache without aura 01/24/2017   Paronychia of left thumb 06/11/2016   History of prior cigarette smoking 05/14/2016   Substance abuse in remission (HCC) 05/14/2016   Alcohol abuse, in remission 05/14/2016   Pincer nail deformity 05/14/2016   Class 2 obesity with body mass index (BMI) of 39.0 to 39.9 in adult 05/14/2016   Hypertriglyceridemia  04/25/2010   Metabolic syndrome 04/25/2010   HTN (hypertension) 04/25/2010   Bipolar 1 disorder (HCC) 04/25/2010    ONSET DATE: March 2023 (pt has been having right shoulder pain for 4+ years but it has gotten worse in the last few months)  REFERRING DIAG: Chronic rt shoulder pain   THERAPY DIAG:  Chronic right shoulder pain  Other symptoms and signs involving the musculoskeletal system  Stiffness of right shoulder, not elsewhere classified  Rationale for Evaluation and Treatment Rehabilitation  SUBJECTIVE:   SUBJECTIVE STATEMENT: "It's been going on for a while now and nothing is really seeming to help. I did get a shot about two weeks ago that is making some difference." Pt accompanied by: self  PERTINENT HISTORY: Pt reports chronic shoulder pain for 4+ years. He had back surgery 09/2020 and reports falling several times since due to lower extremity weakness. He reports feeling a "tearing and popping" during several of the falls which has increased his shoulder pain. An X-ray was taken 08/04/21 with no evidence of fracture or dislocation but mild arthrosis. Pt reports that insurance would not approve an MRI. He reports receiving between 7 and 10 cortisone injections in the right shoulder and that they work "about half the time". He reports that the most recent injection has given him some relief. He was referred to occupational therapy by Terie Purser, PA-C.     PRECAUTIONS: None  WEIGHT BEARING RESTRICTIONS No  PAIN:  Are you having pain? Yes: NPRS scale: 3/10 Pain location: middle deltoid region  Pain description: alternates between sharp pain and dull ache, burning  Aggravating factors: work, sleep, holding up shoulder unsupported  Relieving factors: cortisone shots, tylenol, heat  Increases with activity and lying on right side  FALLS: Has patient fallen in last 6 months? Yes. Number of falls 3 due to decreased feeling in left foot   LIVING ENVIRONMENT: Lives  with: lives with their family Lives in: House/apartment   PLOF: Independent  PATIENT GOALS "Brush my teeth. And I would love to be able to sleep through the night"  OBJECTIVE:   HAND DOMINANCE: Right  ADLs: Overall ADLs: Pt is independent in all ADLs, using left arm to compensate. When pain is at it highest, pt requires left arm to lift right arm to complete grooming tasks such as shaving and brushing teeth. He reports that sleep is significantly impacted by pain.   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  With increased shoulder pain he has been unable to work on his cars as much as he would like to.     FUNCTIONAL OUTCOME MEASURES: FOTO: 50.6  UPPER EXTREMITY ROM     Active ROM Right eval  Shoulder flexion 90  Shoulder abduction 86  Shoulder internal rotation 90  Shoulder external rotation 29  (Blank rows = not tested)   Passive ROM Right eval  Shoulder flexion 135  Shoulder abduction 124  Shoulder internal rotation 90  Shoulder external rotation 22  (Blank rows = not tested)     UPPER EXTREMITY MMT:     MMT Right  eval  Shoulder flexion 3+/5  Shoulder abduction 3+/5  Shoulder internal rotation 4/5  Shoulder external rotation 3+/5  Elbow flexion 4-/5  Elbow extension 4-/5  Wrist flexion 5/5  Wrist extension 5/5  Wrist ulnar deviation 5/5  Wrist radial deviation 5/5  Wrist pronation 5/5  Wrist supination 5/5  (Blank rows = not tested)  HAND FUNCTION: Grip strength: Right: 76 lbs; Left: 98 lbs, Lateral pinch: Right: 20 lbs, Left: 24 lbs, and 3 point pinch: Right: 4 lbs, Left: 15 lbs   COGNITION: Overall cognitive status: Within functional limits for tasks assessed  OBSERVATION:  Moderate fascial restrictions and tight muscle fibers noted throughout RUE, specifically upper trapezius, subscapularis and serratus anterior region, and posterior scapula musculature  TODAY'S TREATMENT:  Evaluation and established HEP   PATIENT  EDUCATION: Education details: AA/ROM, heat and ice for pain management  Person educated: Patient Education method: Explanation, Demonstration, and Handouts Education comprehension: verbalized understanding and returned demonstration   HOME EXERCISE PROGRAM: Eval: AA/ROM  GOALS: Goals reviewed with patient? Yes  SHORT TERM GOALS: Target date: 09/29/21  Pt will be provided with and educated on HEP to improve mobility in RUE required for ADL completion.   Goal status: INITIAL  2.   Pt will decrease pain in RUE to 2/10 or less in order to sleep for 4+ consecutive hours without waking due to pain.   Goal status: INITIAL  3.  Pt will decrease RUE fascial restrictions to minimal amounts or less to improve mobility required for overhead reaching tasks.    Goal status: INITIAL  4. Pt will increase A/ROM of RUE to [facility] to improve ability to reach overhead and behind back during dressing and bathing tasks.   Goal status: INITIAL  5.  Pt will increase strength in RUE to 4+/5 to improve ability to perform lifting tasks required for work.   Goal status: INITIAL      ASSESSMENT:  CLINICAL IMPRESSION: Patient is a 56 y.o. male who was seen today for occupational therapy evaluation for right shoulder pain. He presents with decreased right shoulder ROM and strength, as well as fascial restrictions and increased pain preventing him from completing ADLs/IADLs without significant pain.    PERFORMANCE DEFICITS in functional skills including ADLs, IADLs, ROM, strength, pain, fascial restrictions, muscle spasms, and UE functional use. IMPAIRMENTS are limiting patient from ADLs, IADLs, rest and sleep, work, and leisure.   COMORBIDITIES has no other co-morbidities that affects occupational performance. Patient will benefit from skilled OT to address above impairments and improve overall function.  MODIFICATION OR ASSISTANCE TO COMPLETE EVALUATION: No modification of tasks or assist necessary to  complete an evaluation.  OT OCCUPATIONAL PROFILE AND HISTORY: Problem focused assessment: Including review of records relating to presenting problem.  CLINICAL DECISION MAKING: LOW - limited treatment options, no task modification necessary  REHAB POTENTIAL: Good  EVALUATION COMPLEXITY: Low      PLAN: OT FREQUENCY: 2x/week  OT DURATION: 6 weeks  PLANNED INTERVENTIONS: self care/ADL training, therapeutic exercise, therapeutic activity, manual therapy, passive range of motion, electrical stimulation, ultrasound, moist heat, cryotherapy, patient/family education, and DME and/or AE instructions  CONSULTED AND AGREED WITH PLAN OF CARE: Patient  PLAN FOR NEXT SESSION: Manual/trigger point, P/ROM, UBE, strengthening, AA/ROM, A/ROM  Perley Jain, OTD, OTR/L [phone]  08/16/2021, 3:27 PM

## 2021-08-21 ENCOUNTER — Ambulatory Visit (HOSPITAL_COMMUNITY): Payer: 59

## 2021-08-21 ENCOUNTER — Encounter (HOSPITAL_COMMUNITY): Payer: Self-pay

## 2021-08-21 DIAGNOSIS — R29898 Other symptoms and signs involving the musculoskeletal system: Secondary | ICD-10-CM | POA: Diagnosis not present

## 2021-08-21 DIAGNOSIS — M25511 Pain in right shoulder: Secondary | ICD-10-CM | POA: Diagnosis not present

## 2021-08-21 DIAGNOSIS — M25611 Stiffness of right shoulder, not elsewhere classified: Secondary | ICD-10-CM | POA: Diagnosis not present

## 2021-08-21 DIAGNOSIS — G8929 Other chronic pain: Secondary | ICD-10-CM | POA: Diagnosis not present

## 2021-08-21 NOTE — Patient Instructions (Signed)
   2) (Clinic) Extension / Flexion (Assist)   Face anchor, pull arms back, keeping elbow straight, and squeze shoulder blades together. Repeat 10-15 times. 1-3 times/day.   Copyright  VHI. All rights reserved.   3) (Home) Retraction: Row - Bilateral (Anchor)   Facing anchor, arms reaching forward, pull hands toward stomach, keeping elbows bent and at your sides and pinching shoulder blades together. Repeat 10-15 times. 1-3 times/day.   Copyright  VHI. All rights reserved.   

## 2021-08-21 NOTE — Therapy (Signed)
OUTPATIENT OCCUPATIONAL THERAPY TREATMENT NOTE   Patient Name: Tim [name] MRN: [id number] DOB:[birth year], 56 y.o., male Today's Date: 08/21/2021  PCP: Lenise Herald, PA-C REFERRING PROVIDER: Terie Purser, PA-C   OT End of Session - 08/21/21 1425     Visit Number 2    Number of Visits 13    Date for OT Re-Evaluation 09/29/21    Authorization Type [employer]    Authorization Time Period visit limit 35, 1 used    OT Start Time 1424    OT Stop Time 1508    OT Time Calculation (min) 44 min    Activity Tolerance Patient tolerated treatment well    Behavior During Therapy WFL for tasks assessed/performed             Past Medical History:  Diagnosis Date   Anxiety    Arthritis    Bipolar disorder (HCC)    Depression    Hyperlipidemia    Hypertension    Substance abuse (HCC)    recovered - alcohol 2015, drugs 2012   Past Surgical History:  Procedure Laterality Date   BACK SURGERY  2012   BIOPSY  05/02/2021   Procedure: BIOPSY;  Surgeon: Dolores Frame, MD;  [facility]: [facility];  Service: Gastroenterology;;   CARPAL TUNNEL RELEASE Left    CARPAL TUNNEL RELEASE Right 10/03/2020   Procedure: RIGHT CARPAL TUNNEL RELEASE;  Surgeon: Jadene Pierini, MD;  [facility]: [facility];  Service: Neurosurgery;  Laterality: Right;   ESOPHAGOGASTRODUODENOSCOPY (EGD) WITH PROPOFOL N/A 03/03/2021   Procedure: ESOPHAGOGASTRODUODENOSCOPY (EGD) WITH PROPOFOL;  Surgeon: Dolores Frame, MD;  [facility];  Service: Gastroenterology;  Laterality: N/A;   ESOPHAGOGASTRODUODENOSCOPY (EGD) WITH PROPOFOL N/A 05/02/2021   Procedure: ESOPHAGOGASTRODUODENOSCOPY (EGD) WITH PROPOFOL;  Surgeon: Dolores Frame, MD;  [facility];  Service: Gastroenterology;  Laterality: N/A;  215 ASA 1   EXCISION MASS UPPER EXTREMETIES Left 08/22/2017   Procedure: LEFT THUMB EXCISION MASS NAILBED WITH DEBRIDEMENT OF INTERPHALANGEAL JOINT;  Surgeon: Cindee Salt, MD;  Location: [facility];  Service: Orthopedics;  Laterality: Left;   FRACTURE SURGERY     right hand   HAND SURGERY  1988   SAVORY DILATION  05/02/2021   Procedure: SAVORY DILATION;  Surgeon: Dolores Frame, MD;  [facility]: [facility];  Service: Gastroenterology;;   SPINE SURGERY     , and lumbar decompression   TONSILLECTOMY AND ADENOIDECTOMY     TOTAL HIP ARTHROPLASTY Right 04/03/2019   Procedure: RIGHT TOTAL HIP ARTHROPLASTY ANTERIOR APPROACH;  Surgeon: Kathryne Hitch, MD;  Location: [facility];  Service: Orthopedics;  Laterality: Right;   TRANSFORAMINAL LUMBAR INTERBODY FUSION (TLIF) WITH PEDICLE SCREW FIXATION 3 LEVEL N/A 10/03/2020   Procedure: Lumbar three-four, Lumbar four-five, Lumbar five-Sacral one open decompression with transforaminal lumbar interbody fusion, posterolateral instrumented fusion;  Surgeon: Jadene Pierini, MD;  Location: [facility];  Service: Neurosurgery;  Laterality: N/A;   Patient Active Problem List   Diagnosis Date Noted   Esophageal dysphagia 04/13/2021   Acute esophagitis 04/13/2021   NSAID long-term use 04/13/2021   Spondylolisthesis of lumbar region 10/03/2020   Unilateral primary osteoarthritis, right hip 04/03/2019   Status post total replacement of right hip 04/03/2019   Morbid obesity with BMI of 40.0-44.9, adult (HCC) 07/14/2017   Mood disorder in conditions classified elsewhere 02/14/2017   Alcohol abuse 02/14/2017   Cocaine use disorder, severe, in sustained remission (HCC) 02/14/2017   Chronic back pain greater  than 3 months duration 01/24/2017   Migraine headache without aura 01/24/2017   Paronychia of left thumb 06/11/2016   History of prior cigarette smoking 05/14/2016   Substance abuse in remission (HCC) 05/14/2016   Alcohol abuse, in remission 05/14/2016   Pincer nail deformity 05/14/2016   Class 2 obesity with body mass index (BMI) of 39.0 to 39.9 in adult 05/14/2016   Hypertriglyceridemia  04/25/2010   Metabolic syndrome 04/25/2010   HTN (hypertension) 04/25/2010   Bipolar 1 disorder (HCC) 04/25/2010    ONSET DATE: March 2023 (pt has been having right shoulder pain for 4+ years but it has gotten worse in the last few months)  REFERRING DIAG:  Chronic rt shoulder pain   THERAPY DIAG:  Other symptoms and signs involving the musculoskeletal system  Chronic right shoulder pain  Stiffness of right shoulder, not elsewhere classified  Rationale for Evaluation and Treatment Rehabilitation  PERTINENT HISTORY: Pt reports chronic shoulder pain for 4+ years. He had back surgery 09/2020 and reports falling several times since due to lower extremity weakness. He reports feeling a "tearing and popping" during several of the falls which has increased his shoulder pain. An X-ray was taken 08/04/21 with no evidence of fracture or dislocation but mild arthrosis. Pt reports that insurance would not approve an MRI. He reports receiving between 7 and 10 cortisone injections in the right shoulder and that they work "about half the time". He reports that the most recent injection has given him some relief. He was referred to occupational therapy by Terie Purser, PA-C.   PRECAUTIONS: none  SUBJECTIVE: "It's not much different. I worked a lot this week, probably overdid it."  PAIN:  Are you having pain? Yes: NPRS scale: 7/10 Pain location: Neck and into anterior shoulder  Pain description: alternates between sharp pain and dull ache, burning  Aggravating factors: work, sleep, holding up shoulder unsupported  Relieving factors: cortisone shots, tylenol, heat     OBJECTIVE:   HAND DOMINANCE: Right   ADLs: Overall ADLs: Pt is independent in all ADLs, using left arm to compensate. When pain is at it highest, pt requires left arm to lift right arm to complete grooming tasks such as shaving and brushing teeth. He reports that sleep is significantly impacted by pain.   Pt is a sheet Clinical biochemist, lifting a lot everyday.  With increased shoulder pain he has been unable to work on his cars as much as he would like to.      FUNCTIONAL OUTCOME MEASURES: FOTO: 50.6   UPPER EXTREMITY ROM      Active ROM Right eval  Shoulder flexion 90  Shoulder abduction 86  Shoulder internal rotation 90  Shoulder external rotation 29  (Blank rows = not tested)     Passive ROM Right eval  Shoulder flexion 135  Shoulder abduction 124  Shoulder internal rotation 90  Shoulder external rotation 22  (Blank rows = not tested)         UPPER EXTREMITY MMT:      MMT Right eval  Shoulder flexion 3+/5  Shoulder abduction 3+/5  Shoulder internal rotation 4/5  Shoulder external rotation 3+/5  Elbow flexion 4-/5  Elbow extension 4-/5  Wrist flexion 5/5  Wrist extension 5/5  Wrist ulnar deviation 5/5  Wrist radial deviation 5/5  Wrist pronation 5/5  Wrist supination 5/5  (Blank rows = not tested)   HAND FUNCTION: Grip strength: Right: 76 lbs; Left: 98 lbs, Lateral pinch: Right: 20 lbs, Left: 24  lbs, and 3 point pinch: Right: 4 lbs, Left: 15 lbs     COGNITION: Overall cognitive status: Within functional limits for tasks assessed   OBSERVATION:            Moderate fascial restrictions and tight muscle fibers noted throughout RUE, specifically upper trapezius, subscapularis and serratus anterior region, and posterior scapula musculature   GOALS: Goals reviewed with patient? Yes   SHORT TERM GOALS: Target date: 09/29/21   Pt will be provided with and educated on HEP to improve mobility in RUE required for ADL completion.    Goal status: ongoing   2.   Pt will decrease pain in RUE to 2/10 or less in order to sleep for 4+ consecutive hours without waking due to pain.    Goal status: ongoing   3.  Pt will decrease RUE fascial restrictions to minimal amounts or less to improve mobility required for overhead reaching tasks.    Goal status: ongoing   4. Pt will increase  A/ROM of RUE to [facility] to improve ability to reach overhead and behind back during dressing and bathing tasks.    Goal status: ongoing   5.  Pt will increase strength in RUE to 4+/5 to improve ability to perform lifting tasks required for work.    Goal status: ongoing  TODAY'S TREATMENT:  -Manual Therapy: Trigger point, soft tissue mobilization, and myofascial release completed to upper quadrant to decrease restrictions and pain and increase ROM -AA/ROM: 1x10 forward flexion, IR/er, abduction -Strengthening   -1x10 seated IR/er 2#  -Body blade, 45 seoncds, elbow flexed, elbow extended arm at side -Weighted forward flexion 1x4, 2# -Red Theraband IR, 1x10  -Red theranand, 1x10 row, extension, 3 second hold  -Wall slides: 1x5 with 3 second hold at top -UBE, 5 mins, level 1, 4.5   PATIENT EDUCATION: Education details: Publishing rights manager Person educated: Patient Education method: Explanation, Demonstration, and Handouts Education comprehension: verbalized understanding and returned demonstration   HOME EXERCISE PROGRAM -Eval: AA/ROM -08/21/21: Scapular strengthening, red theraband   ASSESSMENT:   CLINICAL IMPRESSION: A: Manual therapy completed to address muscle tightness and increased pain throughout upper quadrant. Most restriction found along superior and medial border of the scapula, upper trapezius. Pt noting some "loosening up" following. Small, potential cyst, noted at superior angle of the scapula. Pt able to complete AA/ROM with abduction most limited, approximately 65 degrees with eccentric pain. Pt fatiguing quicking with weight shoulder forward flexion with ROM decreasing with each repetition, only able to complete 5. No pain with UBE. Therapist providing moderate tactile cues for proper form with theraband scapular strengthening, pt able to demonstrate good form with final reps, no pain, added to HEP.     PLAN:   OT FREQUENCY: 2x/week  OT DURATION: 6  weeks  PLANNED INTERVENTIONS: self care/ADL training, therapeutic exercise, therapeutic activity, manual therapy, passive range of motion, electrical stimulation, ultrasound, moist heat, cryotherapy, patient/family education, and DME and/or AE instructions  CONSULTED AND AGREED WITH PLAN OF CARE: Patient  PLAN FOR NEXT SESSION: P: Continue with manual, ROM, and strengthening. Ball on wall.       Perley Jain, [place], OTR/L [phone]  08/21/2021, 3:16 PM

## 2021-08-23 ENCOUNTER — Encounter (HOSPITAL_COMMUNITY): Payer: Self-pay

## 2021-08-23 ENCOUNTER — Ambulatory Visit (HOSPITAL_COMMUNITY): Payer: 59 | Attending: Family Medicine

## 2021-08-23 DIAGNOSIS — M25511 Pain in right shoulder: Secondary | ICD-10-CM | POA: Diagnosis not present

## 2021-08-23 DIAGNOSIS — G8929 Other chronic pain: Secondary | ICD-10-CM | POA: Diagnosis not present

## 2021-08-23 DIAGNOSIS — M25611 Stiffness of right shoulder, not elsewhere classified: Secondary | ICD-10-CM | POA: Diagnosis not present

## 2021-08-23 DIAGNOSIS — R29898 Other symptoms and signs involving the musculoskeletal system: Secondary | ICD-10-CM | POA: Diagnosis not present

## 2021-08-23 NOTE — Therapy (Signed)
OUTPATIENT OCCUPATIONAL THERAPY TREATMENT NOTE   Patient Name: Tim [name] MRN: [id number] DOB:08-Mar-1965, 56 y.o., male Today's Date: 08/23/2021  PCP: Lenise Herald, PA-C REFERRING PROVIDER: Terie Purser, PA-C   OT End of Session - 08/23/21 1430     Visit Number 3    Number of Visits 13    Date for OT Re-Evaluation 09/29/21    Authorization Type [employer]    Authorization Time Period visit limit 35, 1 used    OT Start Time 1430    OT Stop Time 1515    OT Time Calculation (min) 45 min    Activity Tolerance Patient tolerated treatment well    Behavior During Therapy WFL for tasks assessed/performed             Past Medical History:  Diagnosis Date   Anxiety    Arthritis    Bipolar disorder (HCC)    Depression    Hyperlipidemia    Hypertension    Substance abuse (HCC)    recovered - alcohol 2015, drugs 2012   Past Surgical History:  Procedure Laterality Date   BACK SURGERY  2012   BIOPSY  05/02/2021   Procedure: BIOPSY;  Surgeon: Dolores Frame, MD;  [facility];  Service: Gastroenterology;;   CARPAL TUNNEL RELEASE Left    CARPAL TUNNEL RELEASE Right 10/03/2020   Procedure: RIGHT CARPAL TUNNEL RELEASE;  Surgeon: Jadene Pierini, MD;  [facility];  Service: Neurosurgery;  Laterality: Right;   ESOPHAGOGASTRODUODENOSCOPY (EGD) WITH PROPOFOL N/A 03/03/2021   Procedure: ESOPHAGOGASTRODUODENOSCOPY (EGD) WITH PROPOFOL;  Surgeon: Dolores Frame, MD;  [facility];  Service: Gastroenterology;  Laterality: N/A;   ESOPHAGOGASTRODUODENOSCOPY (EGD) WITH PROPOFOL N/A 05/02/2021   Procedure: ESOPHAGOGASTRODUODENOSCOPY (EGD) WITH PROPOFOL;  Surgeon: Dolores Frame, MD;  [facility];  Service: Gastroenterology;  Laterality: N/A;  215 ASA 1   EXCISION MASS UPPER EXTREMETIES Left 08/22/2017   Procedure: LEFT THUMB EXCISION MASS NAILBED WITH DEBRIDEMENT OF INTERPHALANGEAL JOINT;  Surgeon: Cindee Salt, MD;  Location: [facility];  Service: Orthopedics;  Laterality: Left;   FRACTURE SURGERY     right hand   HAND SURGERY  1988   SAVORY DILATION  05/02/2021   Procedure: SAVORY DILATION;  Surgeon: Dolores Frame, MD;  [facility];  Service: Gastroenterology;;   SPINE SURGERY     , and lumbar decompression   TONSILLECTOMY AND ADENOIDECTOMY     TOTAL HIP ARTHROPLASTY Right 04/03/2019   Procedure: RIGHT TOTAL HIP ARTHROPLASTY ANTERIOR APPROACH;  Surgeon: Kathryne Hitch, MD;  Location: [facility];  Service: Orthopedics;  Laterality: Right;   TRANSFORAMINAL LUMBAR INTERBODY FUSION (TLIF) WITH PEDICLE SCREW FIXATION 3 LEVEL N/A 10/03/2020   Procedure: Lumbar three-four, Lumbar four-five, Lumbar five-Sacral one open decompression with transforaminal lumbar interbody fusion, posterolateral instrumented fusion;  Surgeon: Jadene Pierini, MD;  Location[facility];  Service: Neurosurgery;  Laterality: N/A;   Patient Active Problem List   Diagnosis Date Noted   Esophageal dysphagia 04/13/2021   Acute esophagitis 04/13/2021   NSAID long-term use 04/13/2021   Spondylolisthesis of lumbar region 10/03/2020   Unilateral primary osteoarthritis, right hip 04/03/2019   Status post total replacement of right hip 04/03/2019   Morbid obesity with BMI of 40.0-44.9, adult (HCC) 07/14/2017   Mood disorder in conditions classified elsewhere 02/14/2017   Alcohol abuse 02/14/2017   Cocaine use disorder, severe, in sustained remission (HCC) 02/14/2017   Chronic back pain greater  than 3 months duration 01/24/2017   Migraine headache without aura 01/24/2017   Paronychia of left thumb 06/11/2016   History of prior cigarette smoking 05/14/2016   Substance abuse in remission (HCC) 05/14/2016   Alcohol abuse, in remission 05/14/2016   Pincer nail deformity 05/14/2016   Class 2 obesity with body mass index (BMI) of 39.0 to 39.9 in adult 05/14/2016   Hypertriglyceridemia  04/25/2010   Metabolic syndrome 04/25/2010   HTN (hypertension) 04/25/2010   Bipolar 1 disorder (HCC) 04/25/2010    ONSET DATE: March 2023 (pt has been having right shoulder pain for 4+ years but it has gotten worse in the last few months)  REFERRING DIAG:  Chronic rt shoulder pain   THERAPY DIAG:  Other symptoms and signs involving the musculoskeletal system  Chronic right shoulder pain  Stiffness of right shoulder, not elsewhere classified  Rationale for Evaluation and Treatment Rehabilitation  PERTINENT HISTORY: Pt reports chronic shoulder pain for 4+ years. He had back surgery 09/2020 and reports falling several times since due to lower extremity weakness. He reports feeling a "tearing and popping" during several of the falls which has increased his shoulder pain. An X-ray was taken 08/04/21 with no evidence of fracture or dislocation but mild arthrosis. Pt reports that insurance would not approve an MRI. He reports receiving between 7 and 10 cortisone injections in the right shoulder and that they work "about half the time". He reports that the most recent injection has given him some relief. He was referred to occupational therapy by Terie Purser, PA-C.   PRECAUTIONS: none  SUBJECTIVE: "I have been hurting since Monday. I had to do the work of two people at work today and I think I overdid it. The shot is wearing off. I don't think it did much."  PAIN:  Are you having pain? Yes: NPRS scale: 7 /10 Pain location: Neck and into anterior shoulder  Pain description: alternates between sharp pain and dull ache, burning  Aggravating factors: work, sleep, holding up shoulder unsupported  Relieving factors: cortisone shots, tylenol, heat     OBJECTIVE:   HAND DOMINANCE: Right   ADLs: Overall ADLs: Pt is independent in all ADLs, using left arm to compensate. When pain is at it highest, pt requires left arm to lift right arm to complete grooming tasks such as shaving and  brushing teeth. He reports that sleep is significantly impacted by pain.   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  With increased shoulder pain he has been unable to work on his cars as much as he would like to.      FUNCTIONAL OUTCOME MEASURES: FOTO: 50.6   UPPER EXTREMITY ROM      Active ROM Right eval  Shoulder flexion 90  Shoulder abduction 86  Shoulder internal rotation 90  Shoulder external rotation 29  (Blank rows = not tested)     Passive ROM Right eval  Shoulder flexion 135  Shoulder abduction 124  Shoulder internal rotation 90  Shoulder external rotation 22  (Blank rows = not tested)         UPPER EXTREMITY MMT:      MMT Right eval  Shoulder flexion 3+/5  Shoulder abduction 3+/5  Shoulder internal rotation 4/5  Shoulder external rotation 3+/5  Elbow flexion 4-/5  Elbow extension 4-/5  Wrist flexion 5/5  Wrist extension 5/5  Wrist ulnar deviation 5/5  Wrist radial deviation 5/5  Wrist pronation 5/5  Wrist supination 5/5  (Blank  rows = not tested)   HAND FUNCTION: Grip strength: Right: 76 lbs; Left: 98 lbs, Lateral pinch: Right: 20 lbs, Left: 24 lbs, and 3 point pinch: Right: 4 lbs, Left: 15 lbs     COGNITION: Overall cognitive status: Within functional limits for tasks assessed   OBSERVATION:            Moderate fascial restrictions and tight muscle fibers noted throughout RUE, specifically upper trapezius, subscapularis and serratus anterior region, and posterior scapula musculature   GOALS: Goals reviewed with patient? Yes   SHORT TERM GOALS: Target date: 09/29/21   Pt will be provided with and educated on HEP to improve mobility in RUE required for ADL completion.    Goal status: ongoing   2.   Pt will decrease pain in RUE to 2/10 or less in order to sleep for 4+ consecutive hours without waking due to pain.    Goal status: ongoing   3.  Pt will decrease RUE fascial restrictions to minimal amounts or less to improve  mobility required for overhead reaching tasks.    Goal status: ongoing   4. Pt will increase A/ROM of RUE to [facility] to improve ability to reach overhead and behind back during dressing and bathing tasks.    Goal status: ongoing   5.  Pt will increase strength in RUE to 4+/5 to improve ability to perform lifting tasks required for work.    Goal status: ongoing  TODAY'S TREATMENT:   08/23/21 -Manual Therapy: Trigger point, soft tissue mobilization, and myofascial release completed to upper quadrant to decrease restrictions and pain and increase ROM -AA/ROM: 1x10 forward flexion, IR/er, abduction, horizontal abduction, protraction  -Soft tissue mobilization: tennis ball at posterior shoulder -Wall slides: 1x5 with 3 second hold at top -Scapular Strengthening: blue theraband, row, extension, IR 1x10 -UBE, 4 mins forward, 2 mins reverse  level 3, 12 speed   08/21/21 -Manual Therapy: Trigger point, soft tissue mobilization, and myofascial release completed to upper quadrant to decrease restrictions and pain and increase ROM -AA/ROM: 1x10 forward flexion, IR/er, abduction -Strengthening   -1x10 seated IR/er 2#  -Body blade, 45 seoncds, elbow flexed, elbow extended arm at side -Weighted forward flexion 1x4, 2# -Red Theraband IR, 1x10  -Red theraband, 1x10 row, extension, 3 second hold  -Wall slides: 1x5 with 3 second hold at top -UBE, 5 mins, level 1, 4.5   PATIENT EDUCATION: Education details: Publishing rights manager Person educated: Patient Education method: Explanation, Demonstration, and Handouts Education comprehension: verbalized understanding and returned demonstration   HOME EXERCISE PROGRAM -Eval: AA/ROM -08/21/21: Scapular strengthening, red theraband -08/23/21: Tennis ball self soft tissue mobilization    ASSESSMENT:   CLINICAL IMPRESSION: A: Pt reporting some increased pain today due to "overdoing it" at work with most pain at the upper trap. Addressed with manual  therapy, pt reporting relief following, able to move his right "better than before". Completed AA/ROM, minimal verbal cues, and encouraging pt to complete everyday. Progressed strengthening, no reported pain but muscle fatigue. Following UBE at conclusion of session, pt able to achieve approximately 120 degrees of abduction, "that's the most I've been able to move it in a while." P/ROM abduction to approximately 160 degrees.     PLAN:   OT FREQUENCY: 2x/week  OT DURATION: 6 weeks  PLANNED INTERVENTIONS: self care/ADL training, therapeutic exercise, therapeutic activity, manual therapy, passive range of motion, electrical stimulation, ultrasound, moist heat, cryotherapy, patient/family education, and DME and/or AE instructions  CONSULTED AND AGREED WITH PLAN OF CARE:  Patient  PLAN FOR NEXT SESSION: P: Continue with manual, ROM, and strengthening. Ball on wall, endurance      [employer], [place], OTR/L [phone]  08/23/2021, 3:22 PM

## 2021-08-24 DIAGNOSIS — M4316 Spondylolisthesis, lumbar region: Secondary | ICD-10-CM | POA: Diagnosis not present

## 2021-08-28 ENCOUNTER — Ambulatory Visit (HOSPITAL_COMMUNITY): Payer: 59

## 2021-08-28 ENCOUNTER — Encounter (HOSPITAL_COMMUNITY): Payer: Self-pay

## 2021-08-28 DIAGNOSIS — G8929 Other chronic pain: Secondary | ICD-10-CM

## 2021-08-28 DIAGNOSIS — M25511 Pain in right shoulder: Secondary | ICD-10-CM | POA: Diagnosis not present

## 2021-08-28 DIAGNOSIS — M25611 Stiffness of right shoulder, not elsewhere classified: Secondary | ICD-10-CM | POA: Diagnosis not present

## 2021-08-28 DIAGNOSIS — R29898 Other symptoms and signs involving the musculoskeletal system: Secondary | ICD-10-CM | POA: Diagnosis not present

## 2021-08-28 NOTE — Therapy (Signed)
OUTPATIENT OCCUPATIONAL THERAPY TREATMENT NOTE   Patient Name: Tim [name] MRN: [id number] DOB:29-Jan-1965, 56 y.o., male Today's Date: 08/28/2021  PCP: Lenise Herald, PA-C REFERRING PROVIDER: Terie Purser, PA-C   OT End of Session - 08/28/21 1427     Visit Number 4    Number of Visits 13    Date for OT Re-Evaluation 09/29/21    Authorization Type [employer]    Authorization Time Period visit limit 35, 1 used    OT Start Time 1345    OT Stop Time 1426    OT Time Calculation (min) 41 min    Activity Tolerance Patient tolerated treatment well    Behavior During Therapy WFL for tasks assessed/performed              Past Medical History:  Diagnosis Date   Anxiety    Arthritis    Bipolar disorder (HCC)    Depression    Hyperlipidemia    Hypertension    Substance abuse (HCC)    recovered - alcohol 2015, drugs 2012   Past Surgical History:  Procedure Laterality Date   BACK SURGERY  2012   BIOPSY  05/02/2021   Procedure: BIOPSY;  Surgeon: Dolores Frame, MD;  Location: [facility];  Service: Gastroenterology;;   CARPAL TUNNEL RELEASE Left    CARPAL TUNNEL RELEASE Right 10/03/2020   Procedure: RIGHT CARPAL TUNNEL RELEASE;  Surgeon: Jadene Pierini, MD;  Location: [facility];  Service: Neurosurgery;  Laterality: Right;   ESOPHAGOGASTRODUODENOSCOPY (EGD) WITH PROPOFOL N/A 03/03/2021   Procedure: ESOPHAGOGASTRODUODENOSCOPY (EGD) WITH PROPOFOL;  Surgeon: Dolores Frame, MD;  [facility]: [facility];  Service: Gastroenterology;  Laterality: N/A;   ESOPHAGOGASTRODUODENOSCOPY (EGD) WITH PROPOFOL N/A 05/02/2021   Procedure: ESOPHAGOGASTRODUODENOSCOPY (EGD) WITH PROPOFOL;  Surgeon: Dolores Frame, MD;  [facility];  Service: Gastroenterology;  Laterality: N/A;  215 ASA 1   EXCISION MASS UPPER EXTREMETIES Left 08/22/2017   Procedure: LEFT THUMB EXCISION MASS NAILBED WITH DEBRIDEMENT OF INTERPHALANGEAL JOINT;  Surgeon: Cindee Salt, MD;  Location: [facility];  Service: Orthopedics;  Laterality: Left;   FRACTURE SURGERY     right hand   HAND SURGERY  1988   SAVORY DILATION  05/02/2021   Procedure: SAVORY DILATION;  Surgeon: Dolores Frame, MD;  Location: [facility];  Service: Gastroenterology;;   SPINE SURGERY     , and lumbar decompression   TONSILLECTOMY AND ADENOIDECTOMY     TOTAL HIP ARTHROPLASTY Right 04/03/2019   Procedure: RIGHT TOTAL HIP ARTHROPLASTY ANTERIOR APPROACH;  Surgeon: Kathryne Hitch, MD;  Location: [facility];  Service: Orthopedics;  Laterality: Right;   TRANSFORAMINAL LUMBAR INTERBODY FUSION (TLIF) WITH PEDICLE SCREW FIXATION 3 LEVEL N/A 10/03/2020   Procedure: Lumbar three-four, Lumbar four-five, Lumbar five-Sacral one open decompression with transforaminal lumbar interbody fusion, posterolateral instrumented fusion;  Surgeon: Jadene Pierini, MD;  Location: [facility];  Service: Neurosurgery;  Laterality: N/A;   Patient Active Problem List   Diagnosis Date Noted   Esophageal dysphagia 04/13/2021   Acute esophagitis 04/13/2021   NSAID long-term use 04/13/2021   Spondylolisthesis of lumbar region 10/03/2020   Unilateral primary osteoarthritis, right hip 04/03/2019   Status post total replacement of right hip 04/03/2019   Morbid obesity with BMI of 40.0-44.9, adult (HCC) 07/14/2017   Mood disorder in conditions classified elsewhere 02/14/2017   Alcohol abuse 02/14/2017   Cocaine use disorder, severe, in sustained remission (HCC) 02/14/2017   Chronic back pain  greater than 3 months duration 01/24/2017   Migraine headache without aura 01/24/2017   Paronychia of left thumb 06/11/2016   History of prior cigarette smoking 05/14/2016   Substance abuse in remission (HCC) 05/14/2016   Alcohol abuse, in remission 05/14/2016   Pincer nail deformity 05/14/2016   Class 2 obesity with body mass index (BMI) of 39.0 to 39.9 in adult 05/14/2016   Hypertriglyceridemia  04/25/2010   Metabolic syndrome 04/25/2010   HTN (hypertension) 04/25/2010   Bipolar 1 disorder (HCC) 04/25/2010    ONSET DATE: March 2023 (pt has been having right shoulder pain for 4+ years but it has gotten worse in the last few months)  REFERRING DIAG:  Chronic rt shoulder pain   THERAPY DIAG:  Other symptoms and signs involving the musculoskeletal system  Stiffness of right shoulder, not elsewhere classified  Chronic right shoulder pain  Rationale for Evaluation and Treatment Rehabilitation  PERTINENT HISTORY: Pt reports chronic shoulder pain for 4+ years. He had back surgery 09/2020 and reports falling several times since due to lower extremity weakness. He reports feeling a "tearing and popping" during several of the falls which has increased his shoulder pain. An X-ray was taken 08/04/21 with no evidence of fracture or dislocation but mild arthrosis. Pt reports that insurance would not approve an MRI. He reports receiving between 7 and 10 cortisone injections in the right shoulder and that they work "about half the time". He reports that the most recent injection has given him some relief. He was referred to occupational therapy by Terie Purser, PA-C.   PRECAUTIONS: none  SUBJECTIVE: "It helped that I had yesterday off. Doing the heat first and then the stretches really helps. I've been able to do those everyday."  PAIN:  Are you having pain? Yes: NPRS scale: 1/10 Pain location: Neck and into anterior shoulder  Pain description: alternates between sharp pain and dull ache, burning  Aggravating factors: work, sleep, holding up shoulder unsupported  Relieving factors: cortisone shots, tylenol, heat     OBJECTIVE:   HAND DOMINANCE: Right   ADLs: Overall ADLs: Pt is independent in all ADLs, using left arm to compensate. When pain is at it highest, pt requires left arm to lift right arm to complete grooming tasks such as shaving and brushing teeth. He reports that sleep  is significantly impacted by pain.   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  With increased shoulder pain he has been unable to work on his cars as much as he would like to.      FUNCTIONAL OUTCOME MEASURES: FOTO: 50.6   UPPER EXTREMITY ROM      Active ROM Right eval  Shoulder flexion 90  Shoulder abduction 86  Shoulder internal rotation 90  Shoulder external rotation 29  (Blank rows = not tested)     Passive ROM Right eval  Shoulder flexion 135  Shoulder abduction 124  Shoulder internal rotation 90  Shoulder external rotation 22  (Blank rows = not tested)         UPPER EXTREMITY MMT:      MMT Right eval  Shoulder flexion 3+/5  Shoulder abduction 3+/5  Shoulder internal rotation 4/5  Shoulder external rotation 3+/5  Elbow flexion 4-/5  Elbow extension 4-/5  Wrist flexion 5/5  Wrist extension 5/5  Wrist ulnar deviation 5/5  Wrist radial deviation 5/5  Wrist pronation 5/5  Wrist supination 5/5  (Blank rows = not tested)   HAND FUNCTION: Grip strength: Right:  76 lbs; Left: 98 lbs, Lateral pinch: Right: 20 lbs, Left: 24 lbs, and 3 point pinch: Right: 4 lbs, Left: 15 lbs     COGNITION: Overall cognitive status: Within functional limits for tasks assessed   OBSERVATION:            Moderate fascial restrictions and tight muscle fibers noted throughout RUE, specifically upper trapezius, subscapularis and serratus anterior region, and posterior scapula musculature   GOALS: Goals reviewed with patient? Yes   SHORT TERM GOALS: Target date: 09/29/21   Pt will be provided with and educated on HEP to improve mobility in RUE required for ADL completion.    Goal status: ongoing   2.   Pt will decrease pain in RUE to 2/10 or less in order to sleep for 4+ consecutive hours without waking due to pain.    Goal status: ongoing   3.  Pt will decrease RUE fascial restrictions to minimal amounts or less to improve mobility required for overhead reaching  tasks.    Goal status: ongoing   4. Pt will increase A/ROM of RUE to [facility] to improve ability to reach overhead and behind back during dressing and bathing tasks.    Goal status: ongoing   5.  Pt will increase strength in RUE to 4+/5 to improve ability to perform lifting tasks required for work.    Goal status: ongoing  TODAY'S TREATMENT:   08/28/21 -Manual Therapy: Trigger point, soft tissue mobilization, and myofascial release completed to upper quadrant to decrease restrictions and pain and increase ROM -Proximal Shoulder Strengthening: 1x30 seconds each movement with rest break between, paddle, criss cross, clockwise circles, counter clockwise circles -Wall slides: 1x5 with 3 second hold at top, forward flexion, abduction -Body blade, 45 seoncds, elbow flexed, elbow extended arm at side, forward flexion -Forward Flexion: yellow theraband, seated, 1x6 -Scapular Strengthening: blue theraband, row, extension, IR, ER 1x10 -UBE, 5 mins forward, level 4, 12 speed  08/23/21 -Manual Therapy: Trigger point, soft tissue mobilization, and myofascial release completed to upper quadrant to decrease restrictions and pain and increase ROM -AA/ROM: 1x10 forward flexion, IR/er, abduction, horizontal abduction, protraction  -Soft tissue mobilization: tennis ball at posterior shoulder -Wall slides: 1x5 with 3 second hold at top -Scapular Strengthening: blue theraband, row, extension, IR 1x10 -UBE, 4 mins forward, 2 mins reverse  level 3, 12 speed   08/21/21 -Manual Therapy: Trigger point, soft tissue mobilization, and myofascial release completed to upper quadrant to decrease restrictions and pain and increase ROM -AA/ROM: 1x10 forward flexion, IR/er, abduction -Strengthening   -1x10 seated IR/er 2#  -Body blade, 45 seoncds, elbow flexed, elbow extended arm at side -Weighted forward flexion 1x4, 2# -Red Theraband IR, 1x10  -Red theraband, 1x10 row, extension, 3 second hold  -Wall slides: 1x5 with  3 second hold at top -UBE, 5 mins, level 1, 4.5   PATIENT EDUCATION: Education details: Reviewed HEP Person educated: Patient Education method: Explanation, Demonstration, and Handouts Education comprehension: verbalized understanding and returned demonstration   HOME EXERCISE PROGRAM -Eval: AA/ROM -08/21/21: Scapular strengthening, red theraband -08/23/21: Tennis ball self soft tissue mobilization    ASSESSMENT:   CLINICAL IMPRESSION: A: Pt presenting with minimal pain at the start of the session despite working this morning, "I am surprised with how good I feel." Reports benefits from HEP, able to complete AA/ROM daily. Good ROM today, full flexion, 65% abduction with a "catching" at 90 degrees. Tolerated resistance and  strengthening exercises today with minimal increased pain, although some fatigue  reported. Therapist providing cuing for shoulder compensation and form. Fatigued quickly with light resistance theraband forward flexion, increased compensations with fatigue. 1-2/10 pain at end of session.     PLAN:   OT FREQUENCY: 2x/week  OT DURATION: 6 weeks  PLANNED INTERVENTIONS: self care/ADL training, therapeutic exercise, therapeutic activity, manual therapy, passive range of motion, electrical stimulation, ultrasound, moist heat, cryotherapy, patient/family education, and DME and/or AE instructions  CONSULTED AND AGREED WITH PLAN OF CARE: Patient  PLAN FOR NEXT SESSION: P: Continue with manual, ROM, and strengthening. Ball on wall, endurance      [employer], [place], OTR/L [phone]  08/28/2021, 2:28 PM

## 2021-08-30 ENCOUNTER — Encounter (HOSPITAL_COMMUNITY): Payer: Self-pay

## 2021-08-30 ENCOUNTER — Ambulatory Visit (HOSPITAL_COMMUNITY): Payer: 59

## 2021-08-30 DIAGNOSIS — G8929 Other chronic pain: Secondary | ICD-10-CM

## 2021-08-30 DIAGNOSIS — M25511 Pain in right shoulder: Secondary | ICD-10-CM | POA: Diagnosis not present

## 2021-08-30 DIAGNOSIS — R29898 Other symptoms and signs involving the musculoskeletal system: Secondary | ICD-10-CM | POA: Diagnosis not present

## 2021-08-30 DIAGNOSIS — M25611 Stiffness of right shoulder, not elsewhere classified: Secondary | ICD-10-CM | POA: Diagnosis not present

## 2021-08-30 NOTE — Therapy (Signed)
OUTPATIENT OCCUPATIONAL THERAPY TREATMENT NOTE   Patient Name: Tim [name] MRN: [id number] DOB:11-01-1965, 56 y.o., male Today's Date: 08/30/2021  PCP: Lenise Herald, PA-C REFERRING PROVIDER: Terie Purser, PA-C   OT End of Session - 08/30/21 1350     Visit Number 5    Number of Visits 13    Date for OT Re-Evaluation 09/29/21    Authorization Type [employer]    Authorization Time Period visit limit 35, 1 used    OT Start Time 1348    OT Stop Time 1429    OT Time Calculation (min) 41 min    Activity Tolerance Patient tolerated treatment well    Behavior During Therapy WFL for tasks assessed/performed              Past Medical History:  Diagnosis Date   Anxiety    Arthritis    Bipolar disorder (HCC)    Depression    Hyperlipidemia    Hypertension    Substance abuse (HCC)    recovered - alcohol 2015, drugs 2012   Past Surgical History:  Procedure Laterality Date   BACK SURGERY  2012   BIOPSY  05/02/2021   Procedure: BIOPSY;  Surgeon: Dolores Frame, MD;  [facility]: [facility];  Service: Gastroenterology;;   CARPAL TUNNEL RELEASE Left    CARPAL TUNNEL RELEASE Right 10/03/2020   Procedure: RIGHT CARPAL TUNNEL RELEASE;  Surgeon: Jadene Pierini, MD;  [facility]: [facility];  Service: Neurosurgery;  Laterality: Right;   ESOPHAGOGASTRODUODENOSCOPY (EGD) WITH PROPOFOL N/A 03/03/2021   Procedure: ESOPHAGOGASTRODUODENOSCOPY (EGD) WITH PROPOFOL;  Surgeon: Dolores Frame, MD;  [facility]: [facility];  Service: Gastroenterology;  Laterality: N/A;   ESOPHAGOGASTRODUODENOSCOPY (EGD) WITH PROPOFOL N/A 05/02/2021   Procedure: ESOPHAGOGASTRODUODENOSCOPY (EGD) WITH PROPOFOL;  Surgeon: Dolores Frame, MD;  [facility]: [facility];  Service: Gastroenterology;  Laterality: N/A;  215 ASA 1   EXCISION MASS UPPER EXTREMETIES Left 08/22/2017   Procedure: LEFT THUMB EXCISION MASS NAILBED WITH DEBRIDEMENT OF INTERPHALANGEAL JOINT;  Surgeon: Cindee Salt, MD;  Location: [facility];  Service: Orthopedics;  Laterality: Left;   FRACTURE SURGERY     right hand   HAND SURGERY  1988   SAVORY DILATION  05/02/2021   Procedure: SAVORY DILATION;  Surgeon: Dolores Frame, MD;  [facility]: [facility];  Service: Gastroenterology;;   SPINE SURGERY     , and lumbar decompression   TONSILLECTOMY AND ADENOIDECTOMY     TOTAL HIP ARTHROPLASTY Right 04/03/2019   Procedure: RIGHT TOTAL HIP ARTHROPLASTY ANTERIOR APPROACH;  Surgeon: Kathryne Hitch, MD;  Location: [facility];  Service: Orthopedics;  Laterality: Right;   TRANSFORAMINAL LUMBAR INTERBODY FUSION (TLIF) WITH PEDICLE SCREW FIXATION 3 LEVEL N/A 10/03/2020   Procedure: Lumbar three-four, Lumbar four-five, Lumbar five-Sacral one open decompression with transforaminal lumbar interbody fusion, posterolateral instrumented fusion;  Surgeon: Jadene Pierini, MD;  Location: [facility];  Service: Neurosurgery;  Laterality: N/A;   Patient Active Problem List   Diagnosis Date Noted   Esophageal dysphagia 04/13/2021   Acute esophagitis 04/13/2021   NSAID long-term use 04/13/2021   Spondylolisthesis of lumbar region 10/03/2020   Unilateral primary osteoarthritis, right hip 04/03/2019   Status post total replacement of right hip 04/03/2019   Morbid obesity with BMI of 40.0-44.9, adult (HCC) 07/14/2017   Mood disorder in conditions classified elsewhere 02/14/2017   Alcohol abuse 02/14/2017   Cocaine use disorder, severe, in sustained remission (HCC) 02/14/2017   Chronic back pain  greater than 3 months duration 01/24/2017   Migraine headache without aura 01/24/2017   Paronychia of left thumb 06/11/2016   History of prior cigarette smoking 05/14/2016   Substance abuse in remission (HCC) 05/14/2016   Alcohol abuse, in remission 05/14/2016   Pincer nail deformity 05/14/2016   Class 2 obesity with body mass index (BMI) of 39.0 to 39.9 in adult 05/14/2016   Hypertriglyceridemia  04/25/2010   Metabolic syndrome 04/25/2010   HTN (hypertension) 04/25/2010   Bipolar 1 disorder (HCC) 04/25/2010    ONSET DATE: March 2023 (pt has been having right shoulder pain for 4+ years but it has gotten worse in the last few months)  REFERRING DIAG:  Chronic rt shoulder pain   THERAPY DIAG:  Other symptoms and signs involving the musculoskeletal system  Stiffness of right shoulder, not elsewhere classified  Chronic right shoulder pain  Rationale for Evaluation and Treatment Rehabilitation  PERTINENT HISTORY: Pt reports chronic shoulder pain for 4+ years. He had back surgery 09/2020 and reports falling several times since due to lower extremity weakness. He reports feeling a "tearing and popping" during several of the falls which has increased his shoulder pain. An X-ray was taken 08/04/21 with no evidence of fracture or dislocation but mild arthrosis. Pt reports that insurance would not approve an MRI. He reports receiving between 7 and 10 cortisone injections in the right shoulder and that they work "about half the time". He reports that the most recent injection has given him some relief. He was referred to occupational therapy by Terie Purser, PA-C.   PRECAUTIONS: none  SUBJECTIVE: "Work was rough today, I knew it was going to hurt."  PAIN:  Are you having pain? Yes: NPRS scale: 3/10 Pain location: Neck and into anterior shoulder  Pain description: alternates between sharp pain and dull ache, burning  Aggravating factors: work, sleep, holding up shoulder unsupported  Relieving factors: cortisone shots, tylenol, heat     OBJECTIVE:   HAND DOMINANCE: Right   ADLs: Overall ADLs: Pt is independent in all ADLs, using left arm to compensate. When pain is at it highest, pt requires left arm to lift right arm to complete grooming tasks such as shaving and brushing teeth. He reports that sleep is significantly impacted by pain.   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  With increased shoulder pain he has been unable to work on his cars as much as he would like to.      FUNCTIONAL OUTCOME MEASURES: FOTO: 50.6   UPPER EXTREMITY ROM      Active ROM Right eval  Shoulder flexion 90  Shoulder abduction 86  Shoulder internal rotation 90  Shoulder external rotation 29  (Blank rows = not tested)     Passive ROM Right eval  Shoulder flexion 135  Shoulder abduction 124  Shoulder internal rotation 90  Shoulder external rotation 22  (Blank rows = not tested)         UPPER EXTREMITY MMT:      MMT Right eval  Shoulder flexion 3+/5  Shoulder abduction 3+/5  Shoulder internal rotation 4/5  Shoulder external rotation 3+/5  Elbow flexion 4-/5  Elbow extension 4-/5  Wrist flexion 5/5  Wrist extension 5/5  Wrist ulnar deviation 5/5  Wrist radial deviation 5/5  Wrist pronation 5/5  Wrist supination 5/5  (Blank rows = not tested)   HAND FUNCTION: Grip strength: Right: 76 lbs; Left: 98 lbs, Lateral pinch: Right: 20 lbs, Left: 24 lbs,  and 3 point pinch: Right: 4 lbs, Left: 15 lbs     COGNITION: Overall cognitive status: Within functional limits for tasks assessed   OBSERVATION:            Moderate fascial restrictions and tight muscle fibers noted throughout RUE, specifically upper trapezius, subscapularis and serratus anterior region, and posterior scapula musculature   GOALS: Goals reviewed with patient? Yes   SHORT TERM GOALS: Target date: 09/29/21   Pt will be provided with and educated on HEP to improve mobility in RUE required for ADL completion.    Goal status: ongoing   2.   Pt will decrease pain in RUE to 2/10 or less in order to sleep for 4+ consecutive hours without waking due to pain.    Goal status: ongoing   3.  Pt will decrease RUE fascial restrictions to minimal amounts or less to improve mobility required for overhead reaching tasks.    Goal status: ongoing   4. Pt will increase A/ROM of RUE to [facility] to  improve ability to reach overhead and behind back during dressing and bathing tasks.    Goal status: ongoing   5.  Pt will increase strength in RUE to 4+/5 to improve ability to perform lifting tasks required for work.    Goal status: ongoing  TODAY'S TREATMENT:   08/30/21 -Manual Therapy: Trigger point, soft tissue mobilization, and myofascial release completed to upper quadrant to decrease restrictions and pain and increase ROM -AA/ROM: 1x10 forward flexion, IR/er, abduction, horizontal abduction, protraction  -Proximal Shoulder Strengthening: 1x30 seconds each movement--paddle, criss cross, rest, clockwise circles, counter clockwise circles, rest  -Body blade, 45 seoncds, elbow flexed, elbow extended arm at side, forward flexion, abduction -Scapular Strengthening  -Bent over row, 1x10, 2 second hold, 10#  -Theraband pull apart, 1x10, 2 second hold, yellow theraband -Bilateral forward flexion with tension on theraband, 1x10, yellow theraband -UBE, 5 mins forward, 2 minutes reverse, level 5, 18 speed  08/28/21 -Manual Therapy: Trigger point, soft tissue mobilization, and myofascial release completed to upper quadrant to decrease restrictions and pain and increase ROM -Proximal Shoulder Strengthening: 1x30 seconds each movement with rest break between, paddle, criss cross, clockwise circles, counter clockwise circles -Wall slides: 1x5 with 3 second hold at top, forward flexion, abduction -Body blade, 45 seoncds, elbow flexed, elbow extended arm at side, forward flexion -Forward Flexion: yellow theraband, seated, 1x6 -Scapular Strengthening: blue theraband, row, extension, IR, ER 1x10 -UBE, 5 mins forward, level 4, 12 speed  08/23/21 -Manual Therapy: Trigger point, soft tissue mobilization, and myofascial release completed to upper quadrant to decrease restrictions and pain and increase ROM -AA/ROM: 1x10 forward flexion, IR/er, abduction, horizontal abduction, protraction  -Soft tissue  mobilization: tennis ball at posterior shoulder -Wall slides: 1x5 with 3 second hold at top -Scapular Strengthening: blue theraband, row, extension, IR 1x10 -UBE, 4 mins forward, 2 mins reverse  level 3, 12 speed    PATIENT EDUCATION: Education details: Massage gun, theraband chest pull and forward flexion  Person educated: Patient Education method: Explanation, Demonstration, and Handouts Education comprehension: verbalized understanding and returned demonstration   HOME EXERCISE PROGRAM Eval: AA/ROM 08/21/21: Scapular strengthening, red theraband 08/23/21: Tennis ball self soft tissue mobilization  08/30/21: Massage gun, theraband chest pull and forward flexion    ASSESSMENT:   CLINICAL IMPRESSION: A: Pt presenting with slightly increased pain today due to working several shifts since last apt. He reports that the stretches continue to help and that he continues to feel significantly better  than when he started. Continued with progression of exercises today, working to improve endurance, introducing several new exercises with moderate tactile cues to ensure proper form and muscle recruitment. No significant pain increase with exercise, but pt reporting fatigue "it feels like I am working it hard." Continues to respond well to manual therapy, therapist providing education on massage gun for home use. Pt receptive.     PLAN:   OT FREQUENCY: 2x/week  OT DURATION: 6 weeks  PLANNED INTERVENTIONS: self care/ADL training, therapeutic exercise, therapeutic activity, manual therapy, passive range of motion, electrical stimulation, ultrasound, moist heat, cryotherapy, patient/family education, and DME and/or AE instructions  CONSULTED AND AGREED WITH PLAN OF CARE: Patient  PLAN FOR NEXT SESSION: P: Continue with manual, ROM, and strengthening. Ball on wall, endurance      [employer], [place], OTR/L [phone]  08/30/2021, 2:30 PM

## 2021-08-30 NOTE — Patient Instructions (Signed)
1) Strengthening: Chest Pull - Resisted   Hold Theraband in front of body with hands about shoulder width a part. Pull band a part and back together slowly. Repeat ____ times. Complete ____ set(s) per session.. Repeat ____ session(s) per day.

## 2021-09-04 ENCOUNTER — Encounter (HOSPITAL_COMMUNITY): Payer: Self-pay

## 2021-09-04 ENCOUNTER — Ambulatory Visit (HOSPITAL_COMMUNITY): Payer: 59

## 2021-09-04 DIAGNOSIS — M25611 Stiffness of right shoulder, not elsewhere classified: Secondary | ICD-10-CM

## 2021-09-04 DIAGNOSIS — R29898 Other symptoms and signs involving the musculoskeletal system: Secondary | ICD-10-CM

## 2021-09-04 DIAGNOSIS — G8929 Other chronic pain: Secondary | ICD-10-CM | POA: Diagnosis not present

## 2021-09-04 DIAGNOSIS — M25511 Pain in right shoulder: Secondary | ICD-10-CM | POA: Diagnosis not present

## 2021-09-04 NOTE — Therapy (Signed)
OUTPATIENT OCCUPATIONAL THERAPY TREATMENT NOTE   Patient Name: Tim [name] MRN: [id number] DOB:28-Jan-1965, 56 y.o., male Today's Date: 09/04/2021  PCP: Lenise Herald, PA-C REFERRING PROVIDER: Terie Purser, PA-C   OT End of Session - 09/04/21 1429     Visit Number 6    Number of Visits 13    Date for OT Re-Evaluation 09/29/21    Authorization Type [employer]    Authorization Time Period visit limit 35, 1 used    OT Start Time 1430    OT Stop Time 1515    OT Time Calculation (min) 45 min    Activity Tolerance Patient tolerated treatment well    Behavior During Therapy WFL for tasks assessed/performed              Past Medical History:  Diagnosis Date   Anxiety    Arthritis    Bipolar disorder (HCC)    Depression    Hyperlipidemia    Hypertension    Substance abuse (HCC)    recovered - alcohol 2015, drugs 2012   Past Surgical History:  Procedure Laterality Date   BACK SURGERY  2012   BIOPSY  05/02/2021   Procedure: BIOPSY;  Surgeon: Dolores Frame, MD;  Location: [facility];  Service: Gastroenterology;;   CARPAL TUNNEL RELEASE Left    CARPAL TUNNEL RELEASE Right 10/03/2020   Procedure: RIGHT CARPAL TUNNEL RELEASE;  Surgeon: Jadene Pierini, MD;  Location: [facility];  Service: Neurosurgery;  Laterality: Right;   ESOPHAGOGASTRODUODENOSCOPY (EGD) WITH PROPOFOL N/A 03/03/2021   Procedure: ESOPHAGOGASTRODUODENOSCOPY (EGD) WITH PROPOFOL;  Surgeon: Dolores Frame, MD;  [facility]: [facility];  Service: Gastroenterology;  Laterality: N/A;   ESOPHAGOGASTRODUODENOSCOPY (EGD) WITH PROPOFOL N/A 05/02/2021   Procedure: ESOPHAGOGASTRODUODENOSCOPY (EGD) WITH PROPOFOL;  Surgeon: Dolores Frame, MD;  [facility]: [facility];  Service: Gastroenterology;  Laterality: N/A;  215 ASA 1   EXCISION MASS UPPER EXTREMETIES Left 08/22/2017   Procedure: LEFT THUMB EXCISION MASS NAILBED WITH DEBRIDEMENT OF INTERPHALANGEAL JOINT;  Surgeon: Cindee Salt, MD;  Location: [facility];  Service: Orthopedics;  Laterality: Left;   FRACTURE SURGERY     right hand   HAND SURGERY  1988   SAVORY DILATION  05/02/2021   Procedure: SAVORY DILATION;  Surgeon: Dolores Frame, MD;  [facility]: [facility];  Service: Gastroenterology;;   SPINE SURGERY     , and lumbar decompression   TONSILLECTOMY AND ADENOIDECTOMY     TOTAL HIP ARTHROPLASTY Right 04/03/2019   Procedure: RIGHT TOTAL HIP ARTHROPLASTY ANTERIOR APPROACH;  Surgeon: Kathryne Hitch, MD;  Location: [facility];  Service: Orthopedics;  Laterality: Right;   TRANSFORAMINAL LUMBAR INTERBODY FUSION (TLIF) WITH PEDICLE SCREW FIXATION 3 LEVEL N/A 10/03/2020   Procedure: Lumbar three-four, Lumbar four-five, Lumbar five-Sacral one open decompression with transforaminal lumbar interbody fusion, posterolateral instrumented fusion;  Surgeon: Jadene Pierini, MD;  Location: [facility];  Service: Neurosurgery;  Laterality: N/A;   Patient Active Problem List   Diagnosis Date Noted   Esophageal dysphagia 04/13/2021   Acute esophagitis 04/13/2021   NSAID long-term use 04/13/2021   Spondylolisthesis of lumbar region 10/03/2020   Unilateral primary osteoarthritis, right hip 04/03/2019   Status post total replacement of right hip 04/03/2019   Morbid obesity with BMI of 40.0-44.9, adult (HCC) 07/14/2017   Mood disorder in conditions classified elsewhere 02/14/2017   Alcohol abuse 02/14/2017   Cocaine use disorder, severe, in sustained remission (HCC) 02/14/2017   Chronic back pain  greater than 3 months duration 01/24/2017   Migraine headache without aura 01/24/2017   Paronychia of left thumb 06/11/2016   History of prior cigarette smoking 05/14/2016   Substance abuse in remission (HCC) 05/14/2016   Alcohol abuse, in remission 05/14/2016   Pincer nail deformity 05/14/2016   Class 2 obesity with body mass index (BMI) of 39.0 to 39.9 in adult 05/14/2016   Hypertriglyceridemia  04/25/2010   Metabolic syndrome 04/25/2010   HTN (hypertension) 04/25/2010   Bipolar 1 disorder (HCC) 04/25/2010    ONSET DATE: March 2023 (pt has been having right shoulder pain for 4+ years but it has gotten worse in the last few months)  REFERRING DIAG:  Chronic rt shoulder pain   THERAPY DIAG:  Other symptoms and signs involving the musculoskeletal system  Stiffness of right shoulder, not elsewhere classified  Chronic right shoulder pain  Rationale for Evaluation and Treatment Rehabilitation  PERTINENT HISTORY: Pt reports chronic shoulder pain for 4+ years. He had back surgery 09/2020 and reports falling several times since due to lower extremity weakness. He reports feeling a "tearing and popping" during several of the falls which has increased his shoulder pain. An X-ray was taken 08/04/21 with no evidence of fracture or dislocation but mild arthrosis. Pt reports that insurance would not approve an MRI. He reports receiving between 7 and 10 cortisone injections in the right shoulder and that they work "about half the time". He reports that the most recent injection has given him some relief. He was referred to occupational therapy by Terie Purser, PA-C.   PRECAUTIONS: none  SUBJECTIVE: "Shoulder was hurting a little more after working on my truck but it loosened up a little bit with the work day. I was able to work on my truck and I owuldnt have been able to do that a month ago."     PAIN:  Are you having pain? Yes: NPRS scale: 4/10 Pain location: Neck and into anterior shoulder  Pain description: alternates between sharp pain and dull ache, burning  Aggravating factors: work, sleep, holding up shoulder unsupported  Relieving factors: cortisone shots, tylenol, heat   OBJECTIVE:   HAND DOMINANCE: Right   ADLs: Overall ADLs: Pt is independent in all ADLs, using left arm to compensate. When pain is at it highest, pt requires left arm to lift right arm to complete grooming  tasks such as shaving and brushing teeth. He reports that sleep is significantly impacted by pain.   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  With increased shoulder pain he has been unable to work on his cars as much as he would like to.      FUNCTIONAL OUTCOME MEASURES: FOTO: 50.6   UPPER EXTREMITY ROM      Active ROM Right eval  Shoulder flexion 90  Shoulder abduction 86  Shoulder internal rotation 90  Shoulder external rotation 29  (Blank rows = not tested)     Passive ROM Right eval  Shoulder flexion 135  Shoulder abduction 124  Shoulder internal rotation 90  Shoulder external rotation 22  (Blank rows = not tested)         UPPER EXTREMITY MMT:      MMT Right eval  Shoulder flexion 3+/5  Shoulder abduction 3+/5  Shoulder internal rotation 4/5  Shoulder external rotation 3+/5  Elbow flexion 4-/5  Elbow extension 4-/5  Wrist flexion 5/5  Wrist extension 5/5  Wrist ulnar deviation 5/5  Wrist radial deviation 5/5  Wrist  pronation 5/5  Wrist supination 5/5  (Blank rows = not tested)   HAND FUNCTION: Grip strength: Right: 76 lbs; Left: 98 lbs, Lateral pinch: Right: 20 lbs, Left: 24 lbs, and 3 point pinch: Right: 4 lbs, Left: 15 lbs     COGNITION: Overall cognitive status: Within functional limits for tasks assessed   OBSERVATION:            Moderate fascial restrictions and tight muscle fibers noted throughout RUE, specifically upper trapezius, subscapularis and serratus anterior region, and posterior scapula musculature   GOALS: Goals reviewed with patient? Yes   SHORT TERM GOALS: Target date: 09/29/21   Pt will be provided with and educated on HEP to improve mobility in RUE required for ADL completion.    Goal status: ongoing   2.   Pt will decrease pain in RUE to 2/10 or less in order to sleep for 4+ consecutive hours without waking due to pain.    Goal status: ongoing   3.  Pt will decrease RUE fascial restrictions to minimal  amounts or less to improve mobility required for overhead reaching tasks.    Goal status: ongoing   4. Pt will increase A/ROM of RUE to [facility] to improve ability to reach overhead and behind back during dressing and bathing tasks.    Goal status: ongoing   5.  Pt will increase strength in RUE to 4+/5 to improve ability to perform lifting tasks required for work.    Goal status: ongoing  TODAY'S TREATMENT:   09/04/21 -Manual Therapy: Trigger point, soft tissue mobilization, and myofascial release completed to upper quadrant to decrease restrictions and pain and increase ROM -Proximal Shoulder Strengthening: 1x30 seconds each movement--paddle, criss cross, clockwise circles, rest, counter clockwise circles -Ball on wall: writing alphabet in both forward flexion and abduction  -AA/ROM: 1x10 forward flexion, IR/er, abduction, horizontal abduction, protraction  -Scapular Strengthening  -Bent over row, 1x10, 2 second hold, 10#  -Theraband pull apart, 1x10, 2 second hold, red theraband -Bilateral forward flexion with tension on theraband, 1x10, red theraband   -Overhead lacing: 2 minutes to lace, 2 minutes, 10 seconds to unlace   -UBE, 3 mins forward, 2 minutes reverse, level 5, 18 speed   08/30/21 -Manual Therapy: Trigger point, soft tissue mobilization, and myofascial release completed to upper quadrant to decrease restrictions and pain and increase ROM -AA/ROM: 1x10 forward flexion, IR/er, abduction, horizontal abduction, protraction  -Proximal Shoulder Strengthening: 1x30 seconds each movement--paddle, criss cross, rest, clockwise circles, counter clockwise circles, rest  -Body blade, 45 seoncds, elbow flexed, elbow extended arm at side, forward flexion, abduction -Scapular Strengthening  -Bent over row, 1x10, 2 second hold, 10#  -Theraband pull apart, 1x10, 2 second hold, yellow theraband -Bilateral forward flexion with tension on theraband, 1x10, yellow theraband -UBE, 5 mins forward, 2  minutes reverse, level 5, 18 speed  08/28/21 -Manual Therapy: Trigger point, soft tissue mobilization, and myofascial release completed to upper quadrant to decrease restrictions and pain and increase ROM -Proximal Shoulder Strengthening: 1x30 seconds each movement with rest break between, paddle, criss cross, clockwise circles, counter clockwise circles -Wall slides: 1x5 with 3 second hold at top, forward flexion, abduction -Body blade, 45 seoncds, elbow flexed, elbow extended arm at side, forward flexion -Forward Flexion: yellow theraband, seated, 1x6 -Scapular Strengthening: blue theraband, row, extension, IR, ER 1x10 -UBE, 5 mins forward, level 4, 12 speed    PATIENT EDUCATION: Education details: Reviewed HEP, reprinted exercises  Person educated: Patient Education method: Explanation, Demonstration, and  Handouts Education comprehension: verbalized understanding and returned demonstration   HOME EXERCISE PROGRAM Eval: AA/ROM 08/21/21: Scapular strengthening, red theraband 08/23/21: Tennis ball self soft tissue mobilization  08/30/21: Massage gun, theraband chest pull and forward flexion    ASSESSMENT:   CLINICAL IMPRESSION: A: Pt reporting some increased pain due to working on his truck for an extended period of time. Continues to benefit from manual therapy and UBE to decrease fascial restrictions and allow for continuous active movement to prevent increased tightness and pain across the shoulder. Challenged endurance today with several new exercises, pt reporting no increased pain but significant shoulder fatigue. Some compensations noted with exercises, therapist providing moderate tactile cues. Significant scapular winging with right shoulder blade, continue to emphasize scapular strengthening exercises.     PLAN:   OT FREQUENCY: 2x/week  OT DURATION: 6 weeks  PLANNED INTERVENTIONS: self care/ADL training, therapeutic exercise, therapeutic activity, manual therapy, passive  range of motion, electrical stimulation, ultrasound, moist heat, cryotherapy, patient/family education, and DME and/or AE instructions  CONSULTED AND AGREED WITH PLAN OF CARE: Patient  PLAN FOR NEXT SESSION: P: Continue with manual, ROM, and strengthening, endurance      Perley Jain, OTD, OTR/L [phone]  09/04/2021, 3:13 PM

## 2021-09-06 ENCOUNTER — Ambulatory Visit (HOSPITAL_COMMUNITY): Payer: 59

## 2021-09-06 ENCOUNTER — Encounter (HOSPITAL_COMMUNITY): Payer: Self-pay

## 2021-09-06 DIAGNOSIS — G8929 Other chronic pain: Secondary | ICD-10-CM

## 2021-09-06 DIAGNOSIS — R29898 Other symptoms and signs involving the musculoskeletal system: Secondary | ICD-10-CM

## 2021-09-06 DIAGNOSIS — M25611 Stiffness of right shoulder, not elsewhere classified: Secondary | ICD-10-CM | POA: Diagnosis not present

## 2021-09-06 DIAGNOSIS — M25511 Pain in right shoulder: Secondary | ICD-10-CM | POA: Diagnosis not present

## 2021-09-06 NOTE — Therapy (Signed)
OUTPATIENT OCCUPATIONAL THERAPY TREATMENT NOTE   Patient Name: Tim [name] MRN: [id number] DOB:02-04-1965, 56 y.o., male Today's Date: 09/06/2021  PCP: Lenise Herald, PA-C REFERRING PROVIDER: Terie Purser, PA-C   OT End of Session - 09/06/21 1434     Visit Number 7    Number of Visits 13    Date for OT Re-Evaluation 09/29/21    Authorization Type [employer]    Authorization Time Period visit limit 35, 1 used    OT Start Time 1431    OT Stop Time 1510    OT Time Calculation (min) 39 min    Activity Tolerance Patient tolerated treatment well    Behavior During Therapy WFL for tasks assessed/performed              Past Medical History:  Diagnosis Date   Anxiety    Arthritis    Bipolar disorder (HCC)    Depression    Hyperlipidemia    Hypertension    Substance abuse (HCC)    recovered - alcohol 2015, drugs 2012   Past Surgical History:  Procedure Laterality Date   BACK SURGERY  2012   BIOPSY  05/02/2021   Procedure: BIOPSY;  Surgeon: Dolores Frame, MD;  [facility];  Service: Gastroenterology;;   CARPAL TUNNEL RELEASE Left    CARPAL TUNNEL RELEASE Right 10/03/2020   Procedure: RIGHT CARPAL TUNNEL RELEASE;  Surgeon: Jadene Pierini, MD;  [facility];  Service: Neurosurgery;  Laterality: Right;   ESOPHAGOGASTRODUODENOSCOPY (EGD) WITH PROPOFOL N/A 03/03/2021   Procedure: ESOPHAGOGASTRODUODENOSCOPY (EGD) WITH PROPOFOL;  Surgeon: Dolores Frame, MD;  [facility];  Service: Gastroenterology;  Laterality: N/A;   ESOPHAGOGASTRODUODENOSCOPY (EGD) WITH PROPOFOL N/A 05/02/2021   Procedure: ESOPHAGOGASTRODUODENOSCOPY (EGD) WITH PROPOFOL;  Surgeon: Dolores Frame, MD;  [facility];  Service: Gastroenterology;  Laterality: N/A;  215 ASA 1   EXCISION MASS UPPER EXTREMETIES Left 08/22/2017   Procedure: LEFT THUMB EXCISION MASS NAILBED WITH DEBRIDEMENT OF INTERPHALANGEAL JOINT;  Surgeon: Cindee Salt, MD;  Location: [facility];  Service: Orthopedics;  Laterality: Left;   FRACTURE SURGERY     right hand   HAND SURGERY  1988   SAVORY DILATION  05/02/2021   Procedure: SAVORY DILATION;  Surgeon: Dolores Frame, MD;  [facility];  Service: Gastroenterology;;   SPINE SURGERY     , and lumbar decompression   TONSILLECTOMY AND ADENOIDECTOMY     TOTAL HIP ARTHROPLASTY Right 04/03/2019   Procedure: RIGHT TOTAL HIP ARTHROPLASTY ANTERIOR APPROACH;  Surgeon: Kathryne Hitch, MD;  Location: [facility];  Service: Orthopedics;  Laterality: Right;   TRANSFORAMINAL LUMBAR INTERBODY FUSION (TLIF) WITH PEDICLE SCREW FIXATION 3 LEVEL N/A 10/03/2020   Procedure: Lumbar three-four, Lumbar four-five, Lumbar five-Sacral one open decompression with transforaminal lumbar interbody fusion, posterolateral instrumented fusion;  Surgeon: Jadene Pierini, MD;  Location: [facility];  Service: Neurosurgery;  Laterality: N/A;   Patient Active Problem List   Diagnosis Date Noted   Esophageal dysphagia 04/13/2021   Acute esophagitis 04/13/2021   NSAID long-term use 04/13/2021   Spondylolisthesis of lumbar region 10/03/2020   Unilateral primary osteoarthritis, right hip 04/03/2019   Status post total replacement of right hip 04/03/2019   Morbid obesity with BMI of 40.0-44.9, adult (HCC) 07/14/2017   Mood disorder in conditions classified elsewhere 02/14/2017   Alcohol abuse 02/14/2017   Cocaine use disorder, severe, in sustained remission (HCC) 02/14/2017   Chronic back pain  greater than 3 months duration 01/24/2017   Migraine headache without aura 01/24/2017   Paronychia of left thumb 06/11/2016   History of prior cigarette smoking 05/14/2016   Substance abuse in remission (HCC) 05/14/2016   Alcohol abuse, in remission 05/14/2016   Pincer nail deformity 05/14/2016   Class 2 obesity with body mass index (BMI) of 39.0 to 39.9 in adult 05/14/2016   Hypertriglyceridemia  04/25/2010   Metabolic syndrome 04/25/2010   HTN (hypertension) 04/25/2010   Bipolar 1 disorder (HCC) 04/25/2010    ONSET DATE: March 2023 (pt has been having right shoulder pain for 4+ years but it has gotten worse in the last few months)  REFERRING DIAG:  Chronic rt shoulder pain   THERAPY DIAG:  Other symptoms and signs involving the musculoskeletal system  Stiffness of right shoulder, not elsewhere classified  Chronic right shoulder pain  Rationale for Evaluation and Treatment Rehabilitation  PERTINENT HISTORY: Pt reports chronic shoulder pain for 4+ years. He had back surgery 09/2020 and reports falling several times since due to lower extremity weakness. He reports feeling a "tearing and popping" during several of the falls which has increased his shoulder pain. An X-ray was taken 08/04/21 with no evidence of fracture or dislocation but mild arthrosis. Pt reports that insurance would not approve an MRI. He reports receiving between 7 and 10 cortisone injections in the right shoulder and that they work "about half the time". He reports that the most recent injection has given him some relief. He was referred to occupational therapy by Terie Purser, PA-C.   PRECAUTIONS: none  SUBJECTIVE: "My shoulders feel good. They recovered well from working on the truck. My back, not so much."    PAIN:  Are you having pain? Yes: NPRS scale: 1/10 Pain location: Neck and into anterior shoulder  Pain description: alternates between sharp pain and dull ache, burning  Aggravating factors: work, sleep, holding up shoulder unsupported  Relieving factors: cortisone shots, tylenol, heat   OBJECTIVE:   HAND DOMINANCE: Right   ADLs: Overall ADLs: Pt is independent in all ADLs, using left arm to compensate. When pain is at it highest, pt requires left arm to lift right arm to complete grooming tasks such as shaving and brushing teeth. He reports that sleep is significantly impacted by pain.   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  With increased shoulder pain he has been unable to work on his cars as much as he would like to.      FUNCTIONAL OUTCOME MEASURES: FOTO: 50.6   UPPER EXTREMITY ROM      Active ROM Right eval  Shoulder flexion 90  Shoulder abduction 86  Shoulder internal rotation 90  Shoulder external rotation 29  (Blank rows = not tested)     Passive ROM Right eval  Shoulder flexion 135  Shoulder abduction 124  Shoulder internal rotation 90  Shoulder external rotation 22  (Blank rows = not tested)         UPPER EXTREMITY MMT:      MMT Right eval  Shoulder flexion 3+/5  Shoulder abduction 3+/5  Shoulder internal rotation 4/5  Shoulder external rotation 3+/5  Elbow flexion 4-/5  Elbow extension 4-/5  Wrist flexion 5/5  Wrist extension 5/5  Wrist ulnar deviation 5/5  Wrist radial deviation 5/5  Wrist pronation 5/5  Wrist supination 5/5  (Blank rows = not tested)   HAND FUNCTION: Grip strength: Right: 76 lbs; Left: 98 lbs, Lateral pinch:  Right: 20 lbs, Left: 24 lbs, and 3 point pinch: Right: 4 lbs, Left: 15 lbs     COGNITION: Overall cognitive status: Within functional limits for tasks assessed   OBSERVATION:            Moderate fascial restrictions and tight muscle fibers noted throughout RUE, specifically upper trapezius, subscapularis and serratus anterior region, and posterior scapula musculature   GOALS: Goals reviewed with patient? Yes   SHORT TERM GOALS: Target date: 09/29/21   Pt will be provided with and educated on HEP to improve mobility in RUE required for ADL completion.    Goal status: ongoing   2.   Pt will decrease pain in RUE to 2/10 or less in order to sleep for 4+ consecutive hours without waking due to pain.    Goal status: ongoing   3.  Pt will decrease RUE fascial restrictions to minimal amounts or less to improve mobility required for overhead reaching tasks.    Goal status: ongoing   4. Pt  will increase A/ROM of RUE to [facility] to improve ability to reach overhead and behind back during dressing and bathing tasks.    Goal status: ongoing   5.  Pt will increase strength in RUE to 4+/5 to improve ability to perform lifting tasks required for work.    Goal status: ongoing  TODAY'S TREATMENT:   8/16   -UBE, 3 mins forward, 3 minutes reverse, level 5, 15 speed   -Overhead lacing: 3 minutes to lace, 2 minutes seconds to unlace   -Shoulder/Scapular Strengthening     -Weighted ball, yellow, 1x10    -IR/er, blue theraband, 1x15    -Blue theraband, rows, extension, 1x15 with 3 second hold   -Pectoralis stretch: 5x15" -Manual Therapy: Trigger point, soft tissue mobilization, and myofascial release completed to upper quadrant to decrease restrictions and pain and increase ROM -Wall Stretch: 1x15 with 15" hold, forward flexion and abduction    09/04/21 -Manual Therapy: Trigger point, soft tissue mobilization, and myofascial release completed to upper quadrant to decrease restrictions and pain and increase ROM -Proximal Shoulder Strengthening: 1x30 seconds each movement--paddle, criss cross, clockwise circles, rest, counter clockwise circles -Ball on wall: writing alphabet in both forward flexion and abduction  -AA/ROM: 1x10 forward flexion, IR/er, abduction, horizontal abduction, protraction  -Scapular Strengthening  -Bent over row, 1x10, 2 second hold, 10#  -Theraband pull apart, 1x10, 2 second hold, red theraband -Bilateral forward flexion with tension on theraband, 1x10, red theraband   -Overhead lacing: 2 minutes to lace, 2 minutes, 10 seconds to unlace   -UBE, 3 mins forward, 2 minutes reverse, level 5, 18 speed   08/30/21 -Manual Therapy: Trigger point, soft tissue mobilization, and myofascial release completed to upper quadrant to decrease restrictions and pain and increase ROM -AA/ROM: 1x10 forward flexion, IR/er, abduction, horizontal abduction, protraction  -Proximal  Shoulder Strengthening: 1x30 seconds each movement--paddle, criss cross, rest, clockwise circles, counter clockwise circles, rest  -Body blade, 45 seoncds, elbow flexed, elbow extended arm at side, forward flexion, abduction -Scapular Strengthening  -Bent over row, 1x10, 2 second hold, 10#  -Theraband pull apart, 1x10, 2 second hold, yellow theraband -Bilateral forward flexion with tension on theraband, 1x10, yellow theraband -UBE, 5 mins forward, 2 minutes reverse, level 5, 18 speed   PATIENT EDUCATION: Education details: Reviewed HEP, reprinted exercises  Person educated: Patient Education method: Explanation, Demonstration, and Handouts Education comprehension: verbalized understanding and returned demonstration   HOME EXERCISE PROGRAM Eval: AA/ROM 08/21/21: Scapular strengthening, red theraband  08/23/21: Tennis ball self soft tissue mobilization  08/30/21: Massage gun, theraband chest pull and forward flexion    ASSESSMENT:   CLINICAL IMPRESSION: A: Pt presenting today with minimal pain, first part of session focusing on strengthening and endurance. Able to increase resistance with blue theraband and complete overhead lacing for longer with fewer rest breaks. Therapist providing tactile cues for mechanics. Pt reporting some increased pain following weighted forward flexion, transitioned to stretching and manual to decrease muscle spasms and pain, pt responding well. Some increased pain reported at conclusion of session. Continue to progress strength and endurance with manual as needed. Discussed pain management techniques including light stretching prior to sleeping with pt reporting a difficult time falling asleep.     PLAN:   OT FREQUENCY: 2x/week  OT DURATION: 6 weeks  PLANNED INTERVENTIONS: self care/ADL training, therapeutic exercise, therapeutic activity, manual therapy, passive range of motion, electrical stimulation, ultrasound, moist heat, cryotherapy, patient/family  education, and DME and/or AE instructions  CONSULTED AND AGREED WITH PLAN OF CARE: Patient  PLAN FOR NEXT SESSION: P: Continue with manual, ROM, and strengthening, endurance      Perley Jain, [place], OTR/L [phone]  09/06/2021, 3:16 PM

## 2021-09-11 ENCOUNTER — Encounter (HOSPITAL_COMMUNITY): Payer: Self-pay

## 2021-09-11 ENCOUNTER — Ambulatory Visit (HOSPITAL_COMMUNITY): Payer: 59

## 2021-09-11 DIAGNOSIS — G8929 Other chronic pain: Secondary | ICD-10-CM | POA: Diagnosis not present

## 2021-09-11 DIAGNOSIS — M25511 Pain in right shoulder: Secondary | ICD-10-CM | POA: Diagnosis not present

## 2021-09-11 DIAGNOSIS — R29898 Other symptoms and signs involving the musculoskeletal system: Secondary | ICD-10-CM | POA: Diagnosis not present

## 2021-09-11 DIAGNOSIS — M25611 Stiffness of right shoulder, not elsewhere classified: Secondary | ICD-10-CM | POA: Diagnosis not present

## 2021-09-11 NOTE — Therapy (Signed)
OUTPATIENT OCCUPATIONAL THERAPY TREATMENT NOTE   Patient Name: Tim [name] MRN: [id number] DOB:[birth year], 56 y.o., male Today's Date: 09/11/2021  PCP: Collene Mares, PA-C REFERRING PROVIDER: Delman Cheadle, PA-C   OT End of Session - 09/11/21 1425     Visit Number 8    Number of Visits 13    Date for OT Re-Evaluation 09/29/21    Authorization Type [employer]    Authorization Time Period visit limit 35, 1 used    OT Start Time 1430    OT Stop Time 1515    OT Time Calculation (min) 45 min    Activity Tolerance Patient tolerated treatment well    Behavior During Therapy WFL for tasks assessed/performed              Past Medical History:  Diagnosis Date   Anxiety    Arthritis    Bipolar disorder ([place])    Depression    Hyperlipidemia    Hypertension    Substance abuse ([place])    recovered - alcohol 2015, drugs 2012   Past Surgical History:  Procedure Laterality Date   BACK SURGERY  2012   BIOPSY  05/02/2021   Procedure: BIOPSY;  Surgeon: Harvel Quale, MD;  [facility];  Service: Gastroenterology;;   CARPAL TUNNEL RELEASE Left    CARPAL TUNNEL RELEASE Right 10/03/2020   Procedure: RIGHT CARPAL TUNNEL RELEASE;  Surgeon: Judith Part, MD;  Location: [place];  Service: Neurosurgery;  Laterality: Right;   ESOPHAGOGASTRODUODENOSCOPY (EGD) WITH PROPOFOL N/A 03/03/2021   Procedure: ESOPHAGOGASTRODUODENOSCOPY (EGD) WITH PROPOFOL;  Surgeon: Harvel Quale, MD;  [facility];  Service: Gastroenterology;  Laterality: N/A;   ESOPHAGOGASTRODUODENOSCOPY (EGD) WITH PROPOFOL N/A 05/02/2021   Procedure: ESOPHAGOGASTRODUODENOSCOPY (EGD) WITH PROPOFOL;  Surgeon: Harvel Quale, MD;  [facility];  Service: Gastroenterology;  Laterality: N/A;  215 ASA 1   EXCISION MASS UPPER EXTREMETIES Left 08/22/2017   Procedure: LEFT THUMB EXCISION MASS NAILBED WITH DEBRIDEMENT OF INTERPHALANGEAL JOINT;  Surgeon: Daryll Brod, MD;  Location: [place];  Service: Orthopedics;  Laterality: Left;   FRACTURE SURGERY     right hand   HAND SURGERY  1988   SAVORY DILATION  05/02/2021   Procedure: SAVORY DILATION;  Surgeon: Harvel Quale, MD;  [facility];  Service: Gastroenterology;;   SPINE SURGERY     , and lumbar decompression   TONSILLECTOMY AND ADENOIDECTOMY     TOTAL HIP ARTHROPLASTY Right 04/03/2019   Procedure: RIGHT TOTAL HIP ARTHROPLASTY ANTERIOR APPROACH;  Surgeon: Mcarthur Rossetti, MD;  [facility];  Service: Orthopedics;  Laterality: Right;   TRANSFORAMINAL LUMBAR INTERBODY FUSION (TLIF) WITH PEDICLE SCREW FIXATION 3 LEVEL N/A 10/03/2020   Procedure: Lumbar three-four, Lumbar four-five, Lumbar five-Sacral one open decompression with transforaminal lumbar interbody fusion, posterolateral instrumented fusion;  Surgeon: Judith Part, MD;  Location: [place];  Service: Neurosurgery;  Laterality: N/A;   Patient Active Problem List   Diagnosis Date Noted   Esophageal dysphagia 04/13/2021   Acute esophagitis 04/13/2021   NSAID long-term use 04/13/2021   Spondylolisthesis of lumbar region 10/03/2020   Unilateral primary osteoarthritis, right hip 04/03/2019   Status post total replacement of right hip 04/03/2019   Morbid obesity with BMI of 40.0-44.9, adult ([place]) 07/14/2017   Mood disorder in conditions classified elsewhere 02/14/2017   Alcohol abuse 02/14/2017   Cocaine use disorder, severe, in sustained remission ([place]) 02/14/2017   Chronic back pain  greater than 3 months duration 01/24/2017   Migraine headache without aura 01/24/2017   Paronychia of left thumb 06/11/2016   History of prior cigarette smoking 05/14/2016   Substance abuse in remission ([place]) 05/14/2016   Alcohol abuse, in remission 05/14/2016   Pincer nail deformity 05/14/2016   Class 2 obesity with body mass index (BMI) of 39.0 to 39.9 in adult 05/14/2016   Hypertriglyceridemia  86/57/8469   Metabolic syndrome 62/95/2841   HTN (hypertension) 04/25/2010   Bipolar 1 disorder ([place]) 04/25/2010    ONSET DATE: March 2023 (pt has been having right shoulder pain for 4+ years but it has gotten worse in the last few months)  REFERRING DIAG:  Chronic rt shoulder pain   THERAPY DIAG:  Other symptoms and signs involving the musculoskeletal system  Stiffness of right shoulder, not elsewhere classified  Chronic right shoulder pain  Rationale for Evaluation and Treatment Rehabilitation  PERTINENT HISTORY: Pt reports chronic shoulder pain for 4+ years. He had back surgery 09/2020 and reports falling several times since due to lower extremity weakness. He reports feeling a "tearing and popping" during several of the falls which has increased his shoulder pain. An X-ray was taken 08/04/21 with no evidence of fracture or dislocation but mild arthrosis. Pt reports that insurance would not approve an MRI. He reports receiving between 7 and 10 cortisone injections in the right shoulder and that they work "about half the time". He reports that the most recent injection has given him some relief. He was referred to occupational therapy by Delman Cheadle, PA-C.   PRECAUTIONS: none  SUBJECTIVE: "I think I overdid it over the weekend."    PAIN:  Are you having pain? Yes: NPRS scale: 3/10 Pain location: Neck and into anterior shoulder  Pain description: alternates between sharp pain and dull ache, burning  Aggravating factors: work, sleep, holding up shoulder unsupported  Relieving factors: cortisone shots, tylenol, heat   OBJECTIVE:   HAND DOMINANCE: Right   ADLs: Overall ADLs: Pt is independent in all ADLs, using left arm to compensate. When pain is at it highest, pt requires left arm to lift right arm to complete grooming tasks such as shaving and brushing teeth. He reports that sleep is significantly impacted by pain.   Pt is a sheet Clinical cytogeneticist, lifting a lot everyday.   With increased shoulder pain he has been unable to work on his cars as much as he would like to.      FUNCTIONAL OUTCOME MEASURES: FOTO: 50.6   UPPER EXTREMITY ROM      Active ROM Right eval Right  09/11/21  Shoulder flexion 90 145  Shoulder abduction 86 177  Shoulder internal rotation 90 90  Shoulder external rotation 29 33  (Blank rows = not tested)     Passive ROM Right eval Right  09/11/21  Shoulder flexion 135 180  Shoulder abduction 124 180  Shoulder internal rotation 90 90  Shoulder external rotation 22 43  (Blank rows = not tested)         UPPER EXTREMITY MMT:      MMT Right eval Right  09/11/21  Shoulder flexion 3+/5 4-/5  Shoulder abduction 3+/5 3+/5*  Shoulder internal rotation 4/5 5/5  Shoulder external rotation 3+/5 4/5  Elbow flexion 4-/5 5/5  Elbow extension 4-/5 5/5  Wrist flexion 5/5 5/5  Wrist extension 5/5 5/5  Wrist ulnar deviation 5/5 5/5  Wrist radial deviation 5/5 5/5  Wrist pronation 5/5 5/5  Wrist supination 5/5 5/5  (  Blank rows = not tested)   HAND FUNCTION: Grip strength: Right: 76 lbs; Left: 98 lbs, Lateral pinch: Right: 20 lbs, Left: 24 lbs, and 3 point pinch: Right: 4 lbs, Left: 15 lbs  8/21: Right- 96 lbs     COGNITION: Overall cognitive status: Within functional limits for tasks assessed   OBSERVATION:            Moderate fascial restrictions and tight muscle fibers noted throughout RUE, specifically upper trapezius, subscapularis and serratus anterior region, and posterior scapula musculature   GOALS: Goals reviewed with patient? Yes   SHORT TERM GOALS: Target date: 09/29/21   Pt will be provided with and educated on HEP to improve mobility in RUE required for ADL completion.    Goal status: MET   2.  Pt will decrease pain in RUE to 2/10 or less in order to sleep for 4+ consecutive hours without waking due to pain.    Goal status: ongoing   3.  Pt will decrease RUE fascial restrictions to minimal amounts or less  to improve mobility required for overhead reaching tasks.    Goal status: ongoing   4. Pt will increase A/ROM of RUE to [facility] to improve ability to reach overhead and behind back during dressing and bathing tasks.    Goal status: MET    5.  Pt will increase strength in RUE to 4+/5 to improve ability to perform lifting tasks required for work.    Goal status: ongoing  TODAY'S TREATMENT:   09/11/21 -External rotation, sidelying, 1x6 with 4#, 1x4 with 2#, 3 second hold at end range    -Internal Rotation, sidelying, 1x10, yellow  theraband    -External Rotation, sidelying, 1x10, 2#, 3 second hold at end range    -Bent over row, 1x15, 8#   -Body Blade, 30 seconds each movement, no break -Elbow flexion, elbow straight, forward flexion to 90 deg, abduction to 90 deg -Manual Therapy: Trigger point, soft tissue mobilization, and myofascial release completed to upper quadrant to decrease restrictions and pain and increase ROM -Scapular Strengthening: blue theraband, 1x15 row, extension -UBE, 3 mins forward, 3 minutes reverse, level 5, 18 speed  09/06/21   -UBE, 3 mins forward, 3 minutes reverse, level 5, 15 speed   -Overhead lacing: 3 minutes to lace, 2 minutes seconds to unlace   -Shoulder/Scapular Strengthening     -Weighted ball, yellow, 1x10    -IR/er, blue theraband, 1x15    -Blue theraband, rows, extension, 1x15 with 3 second hold   -Pectoralis stretch: 5x15" -Manual Therapy: Trigger point, soft tissue mobilization, and myofascial release completed to upper quadrant to decrease restrictions and pain and increase ROM -Wall Stretch: 1x15 with 15" hold, forward flexion and abduction    09/04/21 -Manual Therapy: Trigger point, soft tissue mobilization, and myofascial release completed to upper quadrant to decrease restrictions and pain and increase ROM -Proximal Shoulder Strengthening: 1x30 seconds each movement--paddle, criss cross, clockwise circles, rest, counter clockwise circles -Ball  on wall: writing alphabet in both forward flexion and abduction  -AA/ROM: 1x10 forward flexion, IR/er, abduction, horizontal abduction, protraction  -Scapular Strengthening  -Bent over row, 1x10, 2 second hold, 10#  -Theraband pull apart, 1x10, 2 second hold, red theraband -Bilateral forward flexion with tension on theraband, 1x10, red theraband   -Overhead lacing: 2 minutes to lace, 2 minutes, 10 seconds to unlace   -UBE, 3 mins forward, 2 minutes reverse, level 5, 18 speed     PATIENT EDUCATION: Education details: Reviewed HEP Person  educated: Patient Education method: Explanation, Demonstration, and Handouts Education comprehension: verbalized understanding and returned demonstration   HOME EXERCISE PROGRAM Eval: AA/ROM 08/21/21: Scapular strengthening, red theraband 08/23/21: Tennis ball self soft tissue mobilization  08/30/21: Massage gun, theraband chest pull and forward flexion    ASSESSMENT:   CLINICAL IMPRESSION: A: Pt presents today with increased pain, reporting that he overworked it over the weekend working on his car. Measurements taken for mini-reassessment with significant ROM improvements noted, although pt continues with shoulder weakness and pain with movement. Continues to describe a "catching" at about 90 degrees flexion and abduction. Continued with strengthening and endurance today, increasing resistance and repetitions. Discussed the importance of not overdoing the shoulder at home.     PLAN:   OT FREQUENCY: 2x/week  OT DURATION: 6 weeks  PLANNED INTERVENTIONS: self care/ADL training, therapeutic exercise, therapeutic activity, manual therapy, passive range of motion, electrical stimulation, ultrasound, moist heat, cryotherapy, patient/family education, and DME and/or AE instructions  CONSULTED AND AGREED WITH PLAN OF CARE: Patient  PLAN FOR NEXT SESSION: P: Continue with manual, ROM, and strengthening, endurance      Flonnie Hailstone, OTD,  OTR/L [phone]  09/11/2021, 3:15 PM

## 2021-09-13 ENCOUNTER — Ambulatory Visit (HOSPITAL_COMMUNITY): Payer: 59

## 2021-09-13 ENCOUNTER — Encounter (HOSPITAL_COMMUNITY): Payer: Self-pay

## 2021-09-13 DIAGNOSIS — M25611 Stiffness of right shoulder, not elsewhere classified: Secondary | ICD-10-CM

## 2021-09-13 DIAGNOSIS — M25511 Pain in right shoulder: Secondary | ICD-10-CM | POA: Diagnosis not present

## 2021-09-13 DIAGNOSIS — R29898 Other symptoms and signs involving the musculoskeletal system: Secondary | ICD-10-CM

## 2021-09-13 DIAGNOSIS — G8929 Other chronic pain: Secondary | ICD-10-CM

## 2021-09-13 NOTE — Therapy (Signed)
OUTPATIENT OCCUPATIONAL THERAPY TREATMENT NOTE   Patient Name: Tim [name] Tim [name] [name] MRN: [id number] DOB:09/27/1965, 56 y.o., male Today's Date: 09/13/2021  PCP: Tim [name] Mares, PA-C REFERRING PROVIDER: Delman Cheadle, PA-C   OT End of Session - 09/13/21 1517     Visit Number 9    Number of Visits 13    Date for OT Re-Evaluation 09/29/21    Authorization Type [employer]    Authorization Time Period visit limit 35, 1 used    OT Start Time 1516    OT Stop Time 1558    OT Time Calculation (min) 42 min    Activity Tolerance Patient tolerated treatment well    Behavior During Therapy WFL for tasks assessed/performed              Past Medical History:  Diagnosis Date   Anxiety    Arthritis    Bipolar disorder ([place])    Depression    Hyperlipidemia    Hypertension    Substance abuse ([place])    recovered - alcohol 2015, drugs 2012   Past Surgical History:  Procedure Laterality Date   BACK SURGERY  2012   BIOPSY  05/02/2021   Procedure: BIOPSY;  Surgeon: Tim [name] Quale, MD;  [facility];  Service: Gastroenterology;;   CARPAL TUNNEL RELEASE Left    CARPAL TUNNEL RELEASE Right 10/03/2020   Procedure: RIGHT CARPAL TUNNEL RELEASE;  Surgeon: Tim [name] Part, MD;  Location: [place];  Service: Neurosurgery;  Laterality: Right;   ESOPHAGOGASTRODUODENOSCOPY (EGD) WITH PROPOFOL N/A 03/03/2021   Procedure: ESOPHAGOGASTRODUODENOSCOPY (EGD) WITH PROPOFOL;  Surgeon: Tim [name] Quale, MD;  [facility];  Service: Gastroenterology;  Laterality: N/A;   ESOPHAGOGASTRODUODENOSCOPY (EGD) WITH PROPOFOL N/A 05/02/2021   Procedure: ESOPHAGOGASTRODUODENOSCOPY (EGD) WITH PROPOFOL;  Surgeon: Tim [name] Quale, MD;  [facility];  Service: Gastroenterology;  Laterality: N/A;  215 ASA 1   EXCISION MASS UPPER EXTREMETIES Left 08/22/2017   Procedure: LEFT THUMB EXCISION MASS NAILBED WITH DEBRIDEMENT OF INTERPHALANGEAL JOINT;  Surgeon: Tim [name] Brod, MD;  Location: [place];  Service: Orthopedics;  Laterality: Left;   FRACTURE SURGERY     right hand   HAND SURGERY  1988   SAVORY DILATION  05/02/2021   Procedure: SAVORY DILATION;  Surgeon: Tim [name] Quale, MD;  [facility];  Service: Gastroenterology;;   SPINE SURGERY     , and lumbar decompression   TONSILLECTOMY AND ADENOIDECTOMY     TOTAL HIP ARTHROPLASTY Right 04/03/2019   Procedure: RIGHT TOTAL HIP ARTHROPLASTY ANTERIOR APPROACH;  Surgeon: Tim [name] Rossetti, MD;  [facility];  Service: Orthopedics;  Laterality: Right;   TRANSFORAMINAL LUMBAR INTERBODY FUSION (TLIF) WITH PEDICLE SCREW FIXATION 3 LEVEL N/A 10/03/2020   Procedure: Lumbar three-four, Lumbar four-five, Lumbar five-Sacral one open decompression with transforaminal lumbar interbody fusion, posterolateral instrumented fusion;  Surgeon: Tim [name] Part, MD;  Location: [place];  Service: Neurosurgery;  Laterality: N/A;   Patient Active Problem List   Diagnosis Date Noted   Esophageal dysphagia 04/13/2021   Acute esophagitis 04/13/2021   NSAID long-term use 04/13/2021   Spondylolisthesis of lumbar region 10/03/2020   Unilateral primary osteoarthritis, right hip 04/03/2019   Status post total replacement of right hip 04/03/2019   Morbid obesity with BMI of 40.0-44.9, adult ([place]) 07/14/2017   Mood disorder in conditions classified elsewhere 02/14/2017   Alcohol abuse 02/14/2017   Cocaine use disorder, severe, in sustained remission ([place]) 02/14/2017   Chronic back pain  greater than 3 months duration 01/24/2017   Migraine headache without aura 01/24/2017   Paronychia of left thumb 06/11/2016   History of prior cigarette smoking 05/14/2016   Substance abuse in remission ([place]) 05/14/2016   Alcohol abuse, in remission 05/14/2016   Pincer nail deformity 05/14/2016   Class 2 obesity with body mass index (BMI) of 39.0 to 39.9 in adult 05/14/2016   Hypertriglyceridemia  40/08/6759   Metabolic syndrome 95/09/3265   HTN (hypertension) 04/25/2010   Bipolar 1 disorder ([place]) 04/25/2010    ONSET DATE: March 2023 (pt has been having right shoulder pain for 4+ years but it has gotten worse in the last few months)  REFERRING DIAG:  Chronic rt shoulder pain   THERAPY DIAG:  Other symptoms and signs involving the musculoskeletal system  Stiffness of right shoulder, not elsewhere classified  Chronic right shoulder pain  Rationale for Evaluation and Treatment Rehabilitation  PERTINENT HISTORY: Pt reports chronic shoulder pain for 4+ years. He had back surgery 09/2020 and reports falling several times since due to lower extremity weakness. He reports feeling a "tearing and popping" during several of the Tim [name] [name] which has increased his shoulder pain. An X-ray was taken 08/04/21 with no evidence of fracture or dislocation but mild arthrosis. Pt reports that insurance would not approve an MRI. He reports receiving between 7 and 10 cortisone injections in the right shoulder and that they work "about half the time". He reports that the most recent injection has given him some relief. He was referred to occupational therapy by Tim [name] Cheadle, PA-C.   PRECAUTIONS: none  SUBJECTIVE: "I didn't do a whole lot at work today, I didn't have to pick up a whole lot of weight."    PAIN:  Are you having pain? Yes: NPRS scale: 1/10 Pain location: Neck and into anterior shoulder  Pain description: alternates between sharp pain and dull ache, burning  Aggravating factors: work, sleep, holding up shoulder unsupported  Relieving factors: cortisone shots, tylenol, heat   OBJECTIVE:   HAND DOMINANCE: Right   ADLs: Overall ADLs: Pt is independent in all ADLs, using left arm to compensate. When pain is at it highest, pt requires left arm to lift right arm to complete grooming tasks such as shaving and brushing teeth. He reports that sleep is significantly impacted by pain.   Pt is a  sheet Clinical cytogeneticist, lifting a lot everyday.  With increased shoulder pain he has been unable to work on his cars as much as he would like to.      FUNCTIONAL OUTCOME MEASURES: FOTO: 50.6   UPPER EXTREMITY ROM      Active ROM Right eval Right  09/11/21  Shoulder flexion 90 145  Shoulder abduction 86 177  Shoulder internal rotation 90 90  Shoulder external rotation 29 33  (Blank rows = not tested)     Passive ROM Right eval Right  09/11/21  Shoulder flexion 135 180  Shoulder abduction 124 180  Shoulder internal rotation 90 90  Shoulder external rotation 22 43  (Blank rows = not tested)         UPPER EXTREMITY MMT:      MMT Right eval Right  09/11/21  Shoulder flexion 3+/5 4-/5  Shoulder abduction 3+/5 3+/5*  Shoulder internal rotation 4/5 5/5  Shoulder external rotation 3+/5 4/5  Elbow flexion 4-/5 5/5  Elbow extension 4-/5 5/5  Wrist flexion 5/5 5/5  Wrist extension 5/5 5/5  Wrist ulnar deviation 5/5 5/5  Wrist radial deviation  5/5 5/5  Wrist pronation 5/5 5/5  Wrist supination 5/5 5/5  (Blank rows = not tested)   HAND FUNCTION: Grip strength: Right: 76 lbs; Left: 98 lbs, Lateral pinch: Right: 20 lbs, Left: 24 lbs, and 3 point pinch: Right: 4 lbs, Left: 15 lbs  8/21: Right- 96 lbs     COGNITION: Overall cognitive status: Within functional limits for tasks assessed   OBSERVATION:            Moderate fascial restrictions and tight muscle fibers noted throughout RUE, specifically upper trapezius, subscapularis and serratus anterior region, and posterior scapula musculature   GOALS: Goals reviewed with patient? Yes   SHORT TERM GOALS: Target date: 09/29/21   Pt will be provided with and educated on HEP to improve mobility in RUE required for ADL completion.    Goal status: MET   2.  Pt will decrease pain in RUE to 2/10 or less in order to sleep for 4+ consecutive hours without waking due to pain.    Goal status: ongoing   3.  Pt will decrease RUE  fascial restrictions to minimal amounts or less to improve mobility required for overhead reaching tasks.    Goal status: ongoing   4. Pt will increase A/ROM of RUE to [facility] to improve ability to reach overhead and behind back during dressing and bathing tasks.    Goal status: MET    5.  Pt will increase strength in RUE to 4+/5 to improve ability to perform lifting tasks required for work.    Goal status: ongoing  TODAY'S TREATMENT:   09/13/21 -External rotation, sidelying, 1x10 with 4#, 1x4 with 2#, 3 second hold at end range  -Internal Rotation, sidelying, 1x10, yellow  theraband  -Weighted AA/ROM: 1x10, 5 lb therabar flexion, protraction, overhead press  -Body Blade, 30 seconds each movement, no break -Elbow flexion, elbow straight, forward flexion to 90 deg, abduction to 90 deg -Theraband chest pull apart: yellow theraband, 1x7 with 3 second hold -Scapular Strengthening: blue theraband, 1x15 row, extension -UBE, 3.5 mins forward, 2.5 minutes reverse, level 6, 18 speed -Overhead lacing: 2 minutes 16 seconds, 1 minute, 17 seconds to unlace  09/11/21 -External rotation, sidelying, 1x6 with 4#, 1x4 with 2#, 3 second hold at end range    -Internal Rotation, sidelying, 1x10, yellow  theraband    -External Rotation, sidelying, 1x10, 2#, 3 second hold at end range    -Bent over row, 1x15, 8#   -Body Blade, 30 seconds each movement, no break -Elbow flexion, elbow straight, forward flexion to 90 deg, abduction to 90 deg -Manual Therapy: Trigger point, soft tissue mobilization, and myofascial release completed to upper quadrant to decrease restrictions and pain and increase ROM -Scapular Strengthening: blue theraband, 1x15 row, extension -UBE, 3 mins forward, 3 minutes reverse, level 5, 18 speed  09/06/21   -UBE, 3 mins forward, 3 minutes reverse, level 5, 15 speed   -Overhead lacing: 3 minutes to lace, 2 minutes seconds to unlace   -Shoulder/Scapular Strengthening     -Weighted ball,  yellow, 1x10    -IR/er, blue theraband, 1x15    -Blue theraband, rows, extension, 1x15 with 3 second hold   -Pectoralis stretch: 5x15" -Manual Therapy: Trigger point, soft tissue mobilization, and myofascial release completed to upper quadrant to decrease restrictions and pain and increase ROM -Wall Stretch: 1x15 with 15" hold, forward flexion and abduction    PATIENT EDUCATION: Education details: Reviewed HEP Person educated: Patient Education method: Explanation, Demonstration, and Handouts Education comprehension:  verbalized understanding and returned demonstration   HOME EXERCISE PROGRAM Eval: AA/ROM 08/21/21: Scapular strengthening, red theraband 08/23/21: Tennis ball self soft tissue mobilization  08/30/21: Massage gun, theraband chest pull and forward flexion    ASSESSMENT:   CLINICAL IMPRESSION: A: Pt presents today with decreased pain, did not do a lot of heavy lifting at work today and that the stretches continue to decrease pain. Decreased restrictions throughout UE today, no manual required. Able to complete A/ROM with no pain and no "catching" today. Some slight discomfort with external rotation, otherwise just fatigue noted with exercise today. Therapist cuing for upright posture to maintain shoulder alignment with movement. No increased pain reported at conclusion of session. Discussed dropping to 1x/week to see if pt can maintain low levels of pain with HEP.     PLAN:   OT FREQUENCY: 2x/week  OT DURATION: 6 weeks  PLANNED INTERVENTIONS: self care/ADL training, therapeutic exercise, therapeutic activity, manual therapy, passive range of motion, electrical stimulation, ultrasound, moist heat, cryotherapy, patient/family education, and DME and/or AE instructions  CONSULTED AND AGREED WITH PLAN OF CARE: Patient  PLAN FOR NEXT SESSION: P: Continue with manual, ROM, and strengthening, endurance      Flonnie Hailstone, OTD, OTR/L [phone]  09/13/2021, 4:05 PM

## 2021-09-14 DIAGNOSIS — E6609 Other obesity due to excess calories: Secondary | ICD-10-CM | POA: Diagnosis not present

## 2021-09-14 DIAGNOSIS — I10 Essential (primary) hypertension: Secondary | ICD-10-CM | POA: Diagnosis not present

## 2021-09-14 DIAGNOSIS — M5116 Intervertebral disc disorders with radiculopathy, lumbar region: Secondary | ICD-10-CM | POA: Diagnosis not present

## 2021-09-14 DIAGNOSIS — N451 Epididymitis: Secondary | ICD-10-CM | POA: Diagnosis not present

## 2021-09-14 DIAGNOSIS — E782 Mixed hyperlipidemia: Secondary | ICD-10-CM | POA: Diagnosis not present

## 2021-09-14 DIAGNOSIS — Z6833 Body mass index (BMI) 33.0-33.9, adult: Secondary | ICD-10-CM | POA: Diagnosis not present

## 2021-09-14 DIAGNOSIS — M1991 Primary osteoarthritis, unspecified site: Secondary | ICD-10-CM | POA: Diagnosis not present

## 2021-09-14 DIAGNOSIS — M545 Low back pain, unspecified: Secondary | ICD-10-CM | POA: Diagnosis not present

## 2021-09-18 ENCOUNTER — Encounter (HOSPITAL_COMMUNITY): Payer: 59

## 2021-09-20 ENCOUNTER — Ambulatory Visit (HOSPITAL_COMMUNITY): Payer: 59

## 2021-09-20 ENCOUNTER — Telehealth (HOSPITAL_COMMUNITY): Payer: Self-pay

## 2021-09-20 ENCOUNTER — Encounter (HOSPITAL_COMMUNITY): Payer: 59

## 2021-09-20 NOTE — Telephone Encounter (Signed)
Called pt regarding no show to OT appt. No answer.

## 2021-09-26 ENCOUNTER — Ambulatory Visit (HOSPITAL_COMMUNITY): Payer: 59 | Attending: Family Medicine

## 2021-09-26 ENCOUNTER — Encounter (HOSPITAL_COMMUNITY): Payer: Self-pay

## 2021-09-26 DIAGNOSIS — M25611 Stiffness of right shoulder, not elsewhere classified: Secondary | ICD-10-CM | POA: Insufficient documentation

## 2021-09-26 DIAGNOSIS — R29898 Other symptoms and signs involving the musculoskeletal system: Secondary | ICD-10-CM | POA: Insufficient documentation

## 2021-09-26 DIAGNOSIS — M25511 Pain in right shoulder: Secondary | ICD-10-CM | POA: Diagnosis not present

## 2021-09-26 DIAGNOSIS — G8929 Other chronic pain: Secondary | ICD-10-CM | POA: Insufficient documentation

## 2021-09-26 NOTE — Therapy (Addendum)
OUTPATIENT OCCUPATIONAL THERAPY TREATMENT NOTE and DISCHARGE SUMMARY   Patient Name: Tim [name] MRN: [id number] DOB:29-Jun-1965, 56 y.o., male Today's Date: 09/26/2021  PCP: Collene Mares, PA-C REFERRING PROVIDER: Delman Cheadle, PA-C  OCCUPATIONAL THERAPY DISCHARGE SUMMARY  Visits from Start of Care: 10  Current functional level related to goals / functional outcomes: Pt has met 4/5 LTG and reports that he is able to complete work and leisure tasks with minimal pain or if pain increases, he is able to lower it with exercises/stretches.    Remaining deficits: Pt continues with some decreased shoulder strength, addressed with HEP    Education / Equipment: Comprehensive HEP including theraband   Plan: Patient agrees to discharge.  Patient goals were not met. Patient is being discharged due to meeting the stated rehab goals.        OT End of Session - 09/26/21 1427     Visit Number 10    Number of Visits 13    Date for OT Re-Evaluation 09/29/21    Authorization Type [employer]    Authorization Time Period visit limit 35, 1 used    OT Start Time 1430    OT Stop Time 1515    OT Time Calculation (min) 45 min    Activity Tolerance Patient tolerated treatment well    Behavior During Therapy WFL for tasks assessed/performed              Past Medical History:  Diagnosis Date   Anxiety    Arthritis    Bipolar disorder ([place])    Depression    Hyperlipidemia    Hypertension    Substance abuse ([place])    recovered - alcohol 2015, drugs 2012   Past Surgical History:  Procedure Laterality Date   BACK SURGERY  2012   BIOPSY  05/02/2021   Procedure: BIOPSY;  Surgeon: Harvel Quale, MD;  [facility];  Service: Gastroenterology;;   CARPAL TUNNEL RELEASE Left    CARPAL TUNNEL RELEASE Right 10/03/2020   Procedure: RIGHT CARPAL TUNNEL RELEASE;  Surgeon: Judith Part, MD;  Location: [place];  Service: Neurosurgery;  Laterality: Right;    ESOPHAGOGASTRODUODENOSCOPY (EGD) WITH PROPOFOL N/A 03/03/2021   Procedure: ESOPHAGOGASTRODUODENOSCOPY (EGD) WITH PROPOFOL;  Surgeon: Harvel Quale, MD;  [facility];  Service: Gastroenterology;  Laterality: N/A;   ESOPHAGOGASTRODUODENOSCOPY (EGD) WITH PROPOFOL N/A 05/02/2021   Procedure: ESOPHAGOGASTRODUODENOSCOPY (EGD) WITH PROPOFOL;  Surgeon: Harvel Quale, MD;  [facility];  Service: Gastroenterology;  Laterality: N/A;  215 ASA 1   EXCISION MASS UPPER EXTREMETIES Left 08/22/2017   Procedure: LEFT THUMB EXCISION MASS NAILBED WITH DEBRIDEMENT OF INTERPHALANGEAL JOINT;  Surgeon: Daryll Brod, MD;  Location: [place];  Service: Orthopedics;  Laterality: Left;   FRACTURE SURGERY     right hand   HAND SURGERY  1988   SAVORY DILATION  05/02/2021   Procedure: SAVORY DILATION;  Surgeon: Harvel Quale, MD;  [facility];  Service: Gastroenterology;;   SPINE SURGERY     , and lumbar decompression   TONSILLECTOMY AND ADENOIDECTOMY     TOTAL HIP ARTHROPLASTY Right 04/03/2019   Procedure: RIGHT TOTAL HIP ARTHROPLASTY ANTERIOR APPROACH;  Surgeon: Mcarthur Rossetti, MD;  Location: [facility];  Service: Orthopedics;  Laterality: Right;   TRANSFORAMINAL LUMBAR INTERBODY FUSION (TLIF) WITH PEDICLE SCREW FIXATION 3 LEVEL N/A 10/03/2020   Procedure: Lumbar three-four, Lumbar four-five, Lumbar five-Sacral one open decompression with transforaminal lumbar interbody fusion, posterolateral  instrumented fusion;  Surgeon: Judith Part, MD;  Location: [place];  Service: Neurosurgery;  Laterality: N/A;   Patient Active Problem List   Diagnosis Date Noted   Esophageal dysphagia 04/13/2021   Acute esophagitis 04/13/2021   NSAID long-term use 04/13/2021   Spondylolisthesis of lumbar region 10/03/2020   Unilateral primary osteoarthritis, right hip 04/03/2019   Status post total replacement of right hip 04/03/2019   Morbid obesity  with BMI of 40.0-44.9, adult ([place]) 07/14/2017   Mood disorder in conditions classified elsewhere 02/14/2017   Alcohol abuse 02/14/2017   Cocaine use disorder, severe, in sustained remission ([place]) 02/14/2017   Chronic back pain greater than 3 months duration 01/24/2017   Migraine headache without aura 01/24/2017   Paronychia of left thumb 06/11/2016   History of prior cigarette smoking 05/14/2016   Substance abuse in remission ([place]) 05/14/2016   Alcohol abuse, in remission 05/14/2016   Pincer nail deformity 05/14/2016   Class 2 obesity with body mass index (BMI) of 39.0 to 39.9 in adult 05/14/2016   Hypertriglyceridemia 63/78/5885   Metabolic syndrome 02/77/4128   HTN (hypertension) 04/25/2010   Bipolar 1 disorder ([place]) 04/25/2010    ONSET DATE: March 2023 (pt has been having right shoulder pain for 4+ years but it has gotten worse in the last few months)  REFERRING DIAG:  Chronic rt shoulder pain   THERAPY DIAG:  Other symptoms and signs involving the musculoskeletal system  Stiffness of right shoulder, not elsewhere classified  Chronic right shoulder pain  Rationale for Evaluation and Treatment Rehabilitation  PERTINENT HISTORY: Pt reports chronic shoulder pain for 4+ years. He had back surgery 09/2020 and reports falling several times since due to lower extremity weakness. He reports feeling a "tearing and popping" during several of the falls which has increased his shoulder pain. An X-ray was taken 08/04/21 with no evidence of fracture or dislocation but mild arthrosis. Pt reports that insurance would not approve an MRI. He reports receiving between 7 and 10 cortisone injections in the right shoulder and that they work "about half the time". He reports that the most recent injection has given him some relief. He was referred to occupational therapy by Delman Cheadle, PA-C.   PRECAUTIONS: none  SUBJECTIVE: "I'm not having any pain. I am on a new steroid pack for my back. I did  work pretty hard the past week with pain at maybe a 5. The last few days have been good."    10/12/21 is next apt with   PAIN:  Are you having pain? Yes: NPRS scale: 1/10 Pain location: Neck and into anterior shoulder  Pain description: alternates between sharp pain and dull ache, burning  Aggravating factors: work, sleep, holding up shoulder unsupported  Relieving factors: cortisone shots, tylenol, heat   OBJECTIVE:   HAND DOMINANCE: Right   ADLs: Overall ADLs: Pt is independent in all ADLs, using left arm to compensate. When pain is at it highest, pt requires left arm to lift right arm to complete grooming tasks such as shaving and brushing teeth. He reports that sleep is significantly impacted by pain.   Pt is a sheet Clinical cytogeneticist, lifting a lot everyday.  With increased shoulder pain he has been unable to work on his cars as much as he would like to.      FUNCTIONAL OUTCOME MEASURES: FOTO: 50.6 9/5: 64.92   UPPER EXTREMITY ROM      Active ROM Right eval Right  09/11/21 Right  09/26/21  Shoulder  flexion 90 145 162  Shoulder abduction 86 177 173  Shoulder internal rotation 90 90 90  Shoulder external rotation 29 33 36  (Blank rows = not tested)     Passive ROM Right eval Right  09/11/21  Shoulder flexion 135 180  Shoulder abduction 124 180  Shoulder internal rotation 90 90  Shoulder external rotation 22 43  (Blank rows = not tested)         UPPER EXTREMITY MMT:      MMT Right eval Right  09/11/21 Right  09/26/21  Shoulder flexion 3+/5 4-/5 4/5  Shoulder abduction 3+/5 3+/5* 4-/5  Shoulder internal rotation 4/5 5/5 5/5  Shoulder external rotation 3+/5 4/5 4+/5  Elbow flexion 4-/5 5/5 5/5  Elbow extension 4-/5 5/5   Wrist flexion 5/5 5/5   Wrist extension 5/5 5/5   Wrist ulnar deviation 5/5 5/5   Wrist radial deviation 5/5 5/5   Wrist pronation 5/5 5/5   Wrist supination 5/5 5/5   (Blank rows = not tested)   HAND FUNCTION: Grip strength: Right: 76  lbs; Left: 98 lbs, Lateral pinch: Right: 20 lbs, Left: 24 lbs, and 3 point pinch: Right: 4 lbs, Left: 15 lbs  8/21: Right- 96 lbs  9/5: right 105 lbs     COGNITION: Overall cognitive status: Within functional limits for tasks assessed   OBSERVATION:            Moderate fascial restrictions and tight muscle fibers noted throughout RUE, specifically upper trapezius, subscapularis and serratus anterior region, and posterior scapula musculature   GOALS: Goals reviewed with patient? Yes   SHORT TERM GOALS: Target date: 09/29/21   Pt will be provided with and educated on HEP to improve mobility in RUE required for ADL completion.    Goal status: MET   2.  Pt will decrease pain in RUE to 2/10 or less in order to sleep for 4+ consecutive hours without waking due to pain.    Goal status: MET   3.  Pt will decrease RUE fascial restrictions to minimal amounts or less to improve mobility required for overhead reaching tasks.    Goal status: MET    4. Pt will increase A/ROM of RUE to [facility] to improve ability to reach overhead and behind back during dressing and bathing tasks.    Goal status: MET    5.  Pt will increase strength in RUE to 4+/5 to improve ability to perform lifting tasks required for work.    Goal status: PARTIALLY MET   TODAY'S TREATMENT:   09/26/21 -Shoulder Strengthening: yellow theraband  -1x10, 1x6 shoulder flexion,  -1x8, 1x4 shoulder abduction  -2x15 shoulder er/IR  -1x10 chest pull apart/horizontal abduction  -1x10, 1x5 overhead press -Scapular Strengthening: blue theraband, 1x15 row, extension -UBE, 3 mins forward, 1 minute reverse, level 6, 19 speed -Overhead lacing: 1 minutes 43 seconds, 1 minute, 13 seconds to unlace  09/13/21 -External rotation, sidelying, 1x10 with 4#, 1x4 with 2#, 3 second hold at end range  -Internal Rotation, sidelying, 1x10, yellow  theraband  -Weighted AA/ROM: 1x10, 5 lb therabar flexion, protraction, overhead press  -Body Blade,  30 seconds each movement, no break -Elbow flexion, elbow straight, forward flexion to 90 deg, abduction to 90 deg -Theraband chest pull apart: yellow theraband, 1x7 with 3 second hold -Scapular Strengthening: blue theraband, 1x15 row, extension -UBE, 3.5 mins forward, 2.5 minutes reverse, level 6, 18 speed -Overhead lacing: 2 minutes 16 seconds, 1 minute, 17 seconds to unlace  09/11/21 -External rotation, sidelying, 1x6 with 4#, 1x4 with 2#, 3 second hold at end range    -Internal Rotation, sidelying, 1x10, yellow  theraband    -External Rotation, sidelying, 1x10, 2#, 3 second hold at end range    -Bent over row, 1x15, 8#   -Body Blade, 30 seconds each movement, no break -Elbow flexion, elbow straight, forward flexion to 90 deg, abduction to 90 deg -Manual Therapy: Trigger point, soft tissue mobilization, and myofascial release completed to upper quadrant to decrease restrictions and pain and increase ROM -Scapular Strengthening: blue theraband, 1x15 row, extension -UBE, 3 mins forward, 3 minutes reverse, level 5, 18 speed    PATIENT EDUCATION: Education details: Yellow theraband shoulder strengthening (forward flexion, abduction, overhead press, er/IR) Person educated: Patient Education method: Explanation, Demonstration, and Handouts Education comprehension: verbalized understanding and returned demonstration   HOME EXERCISE PROGRAM Eval: AA/ROM 08/21/21: Scapular strengthening, red theraband 08/23/21: Tennis ball self soft tissue mobilization  08/30/21: Massage gun, theraband chest pull and forward flexion  09/26/21: Yellow theraband shoulder strengthening (forward flexion, abduction, overhead press, er/IR)   ASSESSMENT:   CLINICAL IMPRESSION: A: Pt reports today that he has been consistently at 1/10 pain with some increase at work but pain decreases by the end of the night. ROM WFL and increased strength noted with measurements taken today. Pt continues with some remaining strength  deficits, addressed with addition of several new exercises to HEP with pt able to demonstrate with good form, therapist providing minimal tactile cues initially. Pt agreeable to discharge with HEP today.     PLAN:   OT FREQUENCY: Discharge     Mathews Robinsons, OTR/L [phone]  09/26/2021, 3:35 PM

## 2021-09-28 ENCOUNTER — Encounter (HOSPITAL_COMMUNITY): Payer: 59

## 2021-11-09 DIAGNOSIS — E6609 Other obesity due to excess calories: Secondary | ICD-10-CM | POA: Diagnosis not present

## 2021-11-09 DIAGNOSIS — Z0001 Encounter for general adult medical examination with abnormal findings: Secondary | ICD-10-CM | POA: Diagnosis not present

## 2021-11-09 DIAGNOSIS — I10 Essential (primary) hypertension: Secondary | ICD-10-CM | POA: Diagnosis not present

## 2021-11-09 DIAGNOSIS — Z1331 Encounter for screening for depression: Secondary | ICD-10-CM | POA: Diagnosis not present

## 2021-11-09 DIAGNOSIS — M25811 Other specified joint disorders, right shoulder: Secondary | ICD-10-CM | POA: Diagnosis not present

## 2021-11-09 DIAGNOSIS — R69 Illness, unspecified: Secondary | ICD-10-CM | POA: Diagnosis not present

## 2021-11-09 DIAGNOSIS — M25511 Pain in right shoulder: Secondary | ICD-10-CM | POA: Diagnosis not present

## 2021-11-09 DIAGNOSIS — M1991 Primary osteoarthritis, unspecified site: Secondary | ICD-10-CM | POA: Diagnosis not present

## 2021-11-09 DIAGNOSIS — Z6835 Body mass index (BMI) 35.0-35.9, adult: Secondary | ICD-10-CM | POA: Diagnosis not present

## 2021-11-10 DIAGNOSIS — Z0001 Encounter for general adult medical examination with abnormal findings: Secondary | ICD-10-CM | POA: Diagnosis not present

## 2021-11-10 DIAGNOSIS — I10 Essential (primary) hypertension: Secondary | ICD-10-CM | POA: Diagnosis not present

## 2022-02-22 DIAGNOSIS — Z6834 Body mass index (BMI) 34.0-34.9, adult: Secondary | ICD-10-CM | POA: Diagnosis not present

## 2022-02-22 DIAGNOSIS — G5730 Lesion of lateral popliteal nerve, unspecified lower limb: Secondary | ICD-10-CM | POA: Diagnosis not present

## 2022-02-22 DIAGNOSIS — M4316 Spondylolisthesis, lumbar region: Secondary | ICD-10-CM | POA: Diagnosis not present

## 2022-02-23 ENCOUNTER — Other Ambulatory Visit: Payer: Self-pay

## 2022-02-23 DIAGNOSIS — E6609 Other obesity due to excess calories: Secondary | ICD-10-CM | POA: Diagnosis not present

## 2022-02-23 DIAGNOSIS — Z6835 Body mass index (BMI) 35.0-35.9, adult: Secondary | ICD-10-CM | POA: Diagnosis not present

## 2022-02-23 DIAGNOSIS — R5383 Other fatigue: Secondary | ICD-10-CM | POA: Diagnosis not present

## 2022-02-23 DIAGNOSIS — M25511 Pain in right shoulder: Secondary | ICD-10-CM | POA: Diagnosis not present

## 2022-02-23 DIAGNOSIS — R202 Paresthesia of skin: Secondary | ICD-10-CM

## 2022-02-23 DIAGNOSIS — B360 Pityriasis versicolor: Secondary | ICD-10-CM | POA: Diagnosis not present

## 2022-03-02 ENCOUNTER — Ambulatory Visit: Payer: 59 | Admitting: Neurology

## 2022-03-02 DIAGNOSIS — M5417 Radiculopathy, lumbosacral region: Secondary | ICD-10-CM

## 2022-03-02 DIAGNOSIS — R202 Paresthesia of skin: Secondary | ICD-10-CM | POA: Diagnosis not present

## 2022-03-02 NOTE — Procedures (Signed)
[facility]  [place], [address]  [place], [place] [address] Tel: [phone] Fax: [phone] Test Date:  03/02/2022  Patient: Tim [name] DOB: 1965-07-03 Physician: Narda Amber, DO  Sex: Male Height: 5' 11"$  Ref Phys: Emelda Brothers, MD  ID#: [id number]   Technician:    History: This is a 57 year old man with history of L3-S1 PLIF referred for evaluation of left foot weakness and paresthesias.  NCV & EMG Findings: Extensive electrodiagnostic testing of the left lower extremity and additional studies of the right shows:  Bilateral superficial peroneal sensory responses are symmetric and within normal limits.  Left sural sensory response is within normal limits. Bilateral peroneal motor responses at the extensor digitorum brevis are absent.  Bilateral peroneal motor responses at the tibialis anterior and left tibial motor response shows reduced amplitude (L2.2, R2.1, L2.6 mV). Left tibial H reflex study is prolonged. Chronic motor axonal loss changes are seen affecting the L5-S1 myotome on the left, without accompanying active denervation.  Impression: Chronic L5-S1 radiculopathy affecting the left lower extremity, severe. There is no evidence of a superimposed common peroneal mononeuropathy or sensorimotor polyneuropathy.   ___________________________ Narda Amber, DO    Nerve Conduction Studies   Stim Site NR Peak (ms) Norm Peak (ms) O-P Amp (V) Norm O-P Amp  Left Sup Peroneal Anti Sensory (Ant Lat Mall)  33 C  12 cm    2.4 <4.6 6.5 >4  Right Sup Peroneal Anti Sensory (Ant Lat Mall)  33 C  12 cm    2.9 <4.6 6.1 >4  Left Sural Anti Sensory (Lat Mall)  33 C  Calf    3.6 <4.6 6.0 >4     Stim Site NR Onset (ms) Norm Onset (ms) O-P Amp (mV) Norm O-P Amp Site1 Site2 Delta-0 (ms) Dist (cm) Vel (m/s) Norm Vel (m/s)  Left Peroneal Motor (Ext Dig Brev)  33 C  Ankle *NR  <6.0  >2.5 B Fib Ankle  0.0  >40  B Fib *NR     Poplt B Fib  0.0  >40  Poplt *NR             Right Peroneal Motor (Ext Dig Brev)  33 C  Ankle *NR  <6.0  >2.5 B Fib Ankle  0.0  >40  B Fib *NR     Poplt B Fib  0.0  >40  Poplt *NR            Left Peroneal TA Motor (Tib Ant)  33 C  Fib Head    4.1 <4.5 *2.2 >3 Poplit Fib Head 1.8 10.0 56 >40  Poplit    5.7 <5.7 2.0         Right Peroneal TA Motor (Tib Ant)  33 C  Fib Head    3.9 <4.5 *2.1 >3 Poplit Fib Head 1.7 10.0 59 >40  Poplit    5.6 <5.7 2.1         Left Tibial Motor ([place]d Hall Brev)  33 C  Ankle    4.5 <6.0 *2.6 >4 Knee Ankle 10.8 43.0 40 >40  Knee    15.3  1.5          Electromyography   Side Muscle Ins.Act Fibs Fasc Recrt Amp Dur Poly Activation Comment  Left AntTibialis Nml Nml Nml *3- *2+ *2+ *1+ Nml N/A  Left Gastroc Nml Nml Nml *2- *1+ *1+ *1+ Nml N/A  Left Flex Dig Long Nml Nml Nml *3- *2+ *2+ *1+ Nml N/A  Left  RectFemoris Nml Nml Nml Nml Nml Nml Nml Nml N/A  Left GluteusMed Nml Nml Nml *2- *1+ *1+ *1+ Nml N/A  Left BicepsFemS Nml Nml Nml *2- *1+ *1+ *1+ Nml N/A      Waveforms:

## 2022-03-21 DIAGNOSIS — M4316 Spondylolisthesis, lumbar region: Secondary | ICD-10-CM | POA: Diagnosis not present

## 2022-03-21 DIAGNOSIS — Z6834 Body mass index (BMI) 34.0-34.9, adult: Secondary | ICD-10-CM | POA: Diagnosis not present

## 2022-06-01 DIAGNOSIS — M19012 Primary osteoarthritis, left shoulder: Secondary | ICD-10-CM | POA: Diagnosis not present

## 2022-06-01 DIAGNOSIS — M19011 Primary osteoarthritis, right shoulder: Secondary | ICD-10-CM | POA: Diagnosis not present

## 2022-06-01 DIAGNOSIS — E6609 Other obesity due to excess calories: Secondary | ICD-10-CM | POA: Diagnosis not present

## 2022-06-01 DIAGNOSIS — Z6836 Body mass index (BMI) 36.0-36.9, adult: Secondary | ICD-10-CM | POA: Diagnosis not present

## 2022-09-20 DIAGNOSIS — E6609 Other obesity due to excess calories: Secondary | ICD-10-CM | POA: Diagnosis not present

## 2022-09-20 DIAGNOSIS — M19011 Primary osteoarthritis, right shoulder: Secondary | ICD-10-CM | POA: Diagnosis not present

## 2022-09-20 DIAGNOSIS — F3181 Bipolar II disorder: Secondary | ICD-10-CM | POA: Diagnosis not present

## 2022-09-20 DIAGNOSIS — Z6836 Body mass index (BMI) 36.0-36.9, adult: Secondary | ICD-10-CM | POA: Diagnosis not present

## 2022-12-28 DIAGNOSIS — Z6838 Body mass index (BMI) 38.0-38.9, adult: Secondary | ICD-10-CM | POA: Diagnosis not present

## 2022-12-28 DIAGNOSIS — M19011 Primary osteoarthritis, right shoulder: Secondary | ICD-10-CM | POA: Diagnosis not present

## 2023-03-29 DIAGNOSIS — F411 Generalized anxiety disorder: Secondary | ICD-10-CM | POA: Diagnosis not present

## 2023-03-29 DIAGNOSIS — M19011 Primary osteoarthritis, right shoulder: Secondary | ICD-10-CM | POA: Diagnosis not present

## 2023-03-29 DIAGNOSIS — Z6836 Body mass index (BMI) 36.0-36.9, adult: Secondary | ICD-10-CM | POA: Diagnosis not present

## 2023-03-29 DIAGNOSIS — F3181 Bipolar II disorder: Secondary | ICD-10-CM | POA: Diagnosis not present

## 2023-03-29 DIAGNOSIS — E6609 Other obesity due to excess calories: Secondary | ICD-10-CM | POA: Diagnosis not present

## 2023-08-07 IMAGING — DX DG CHEST 1V PORT
1 series · 2 of 2 positions shown · non-contrast
Comparison: 03/03/2021 chest radiograph.

CLINICAL DATA: Hypoxia, altered mental status

EXAM:
PORTABLE CHEST 1 VIEW

[Series 1: chest ap · 0.14mm/px · 2 of 2 slices shown]
[im 1/2]
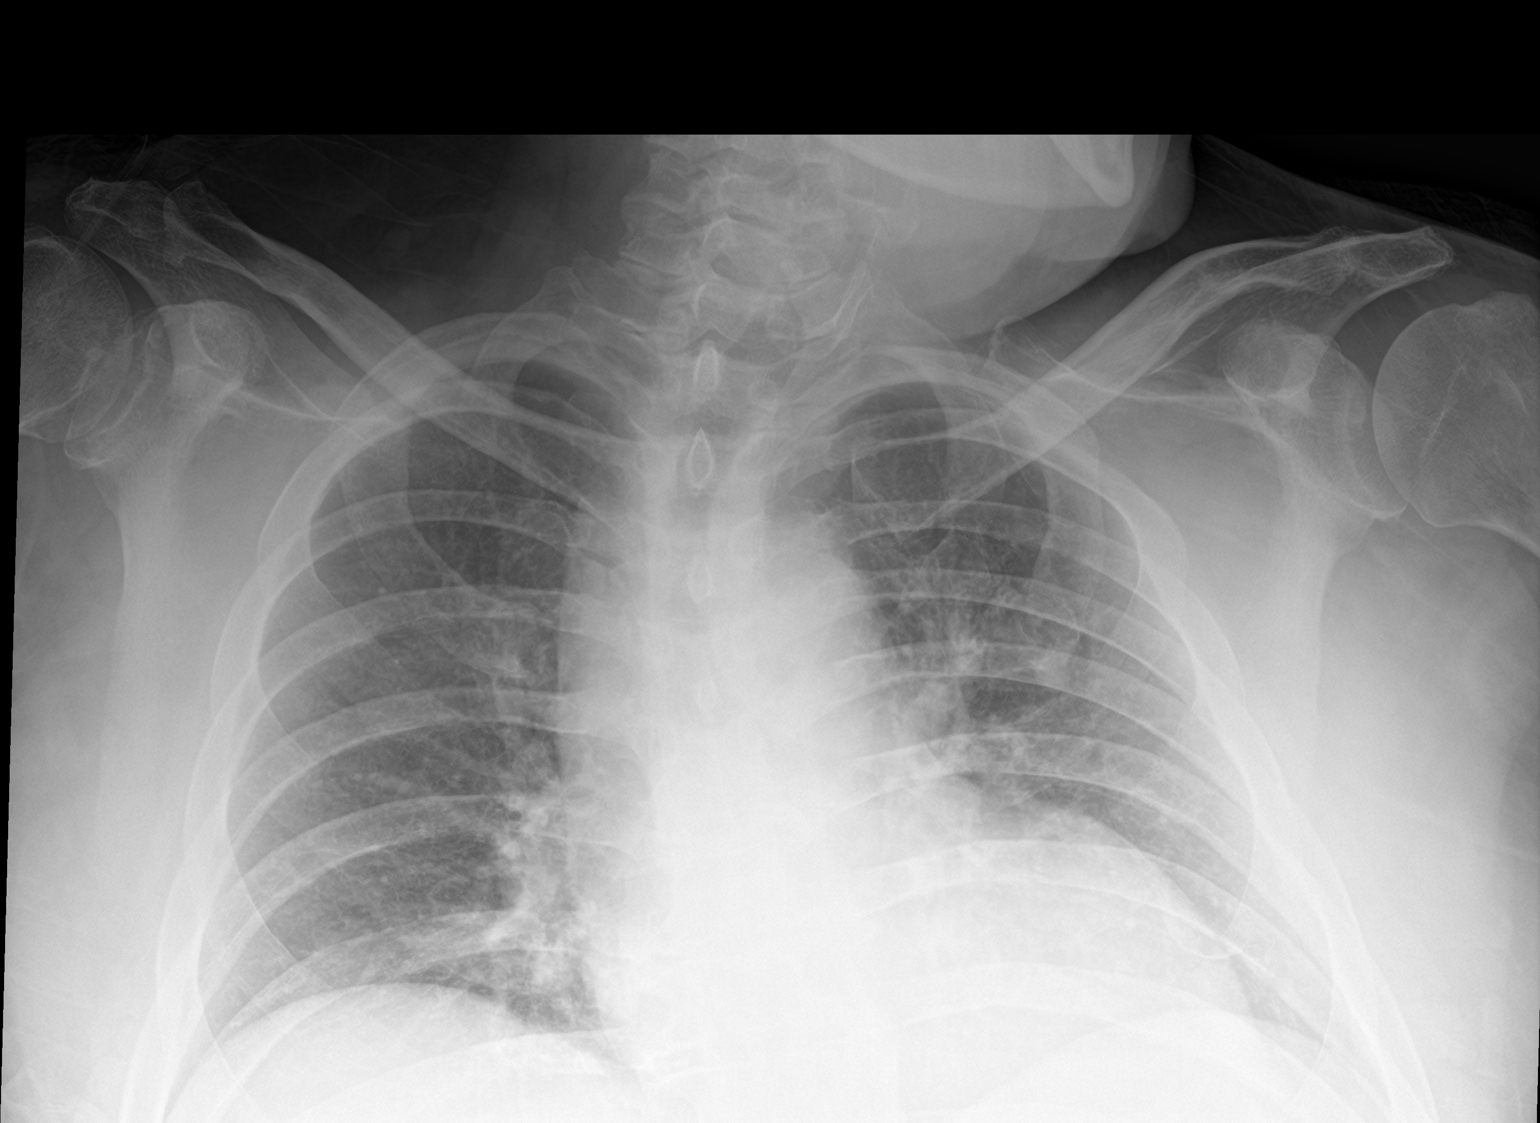
[im 2/2]
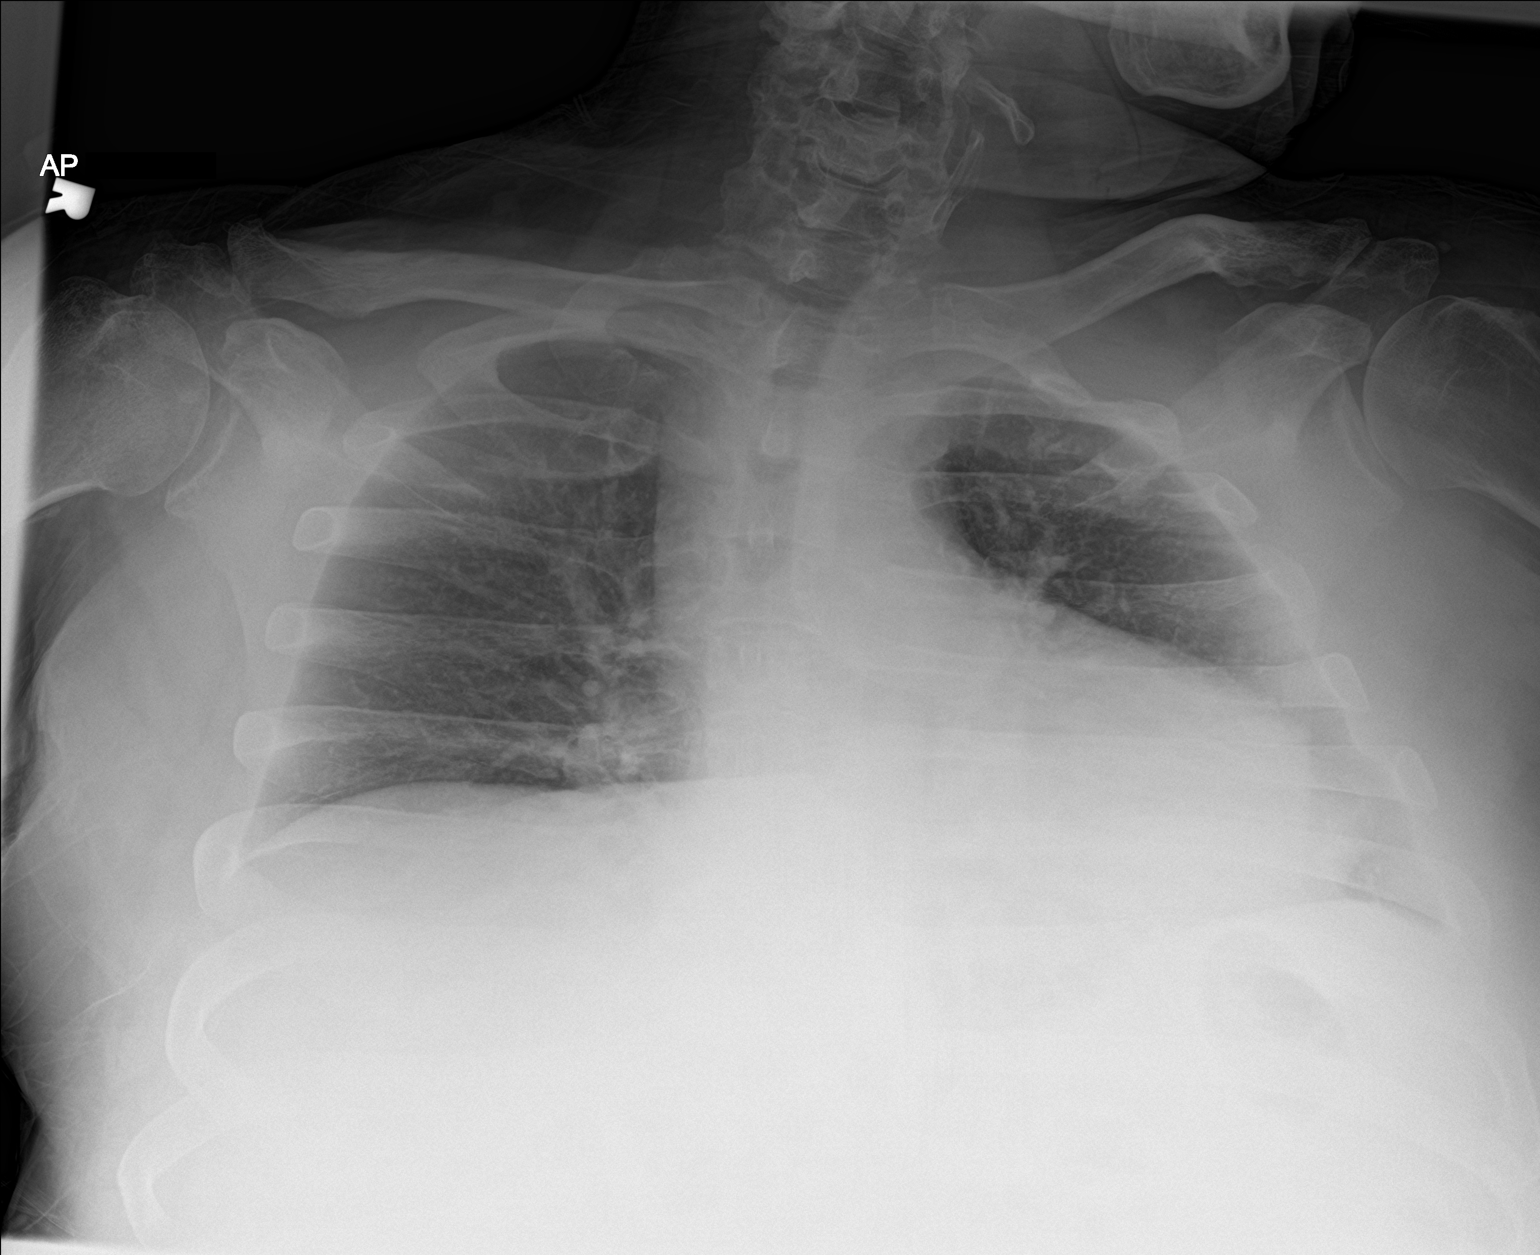

[2 of 2 positions shown; findings below may reference images not displayed]

FINDINGS: Low lung volumes. Stable cardiomediastinal silhouette with normal
heart size. No pneumothorax. No pleural effusion. No pulmonary
edema. Mild streaky bibasilar atelectasis. No consolidative airspace
disease.
IMPRESSION: Low lung volumes with mild streaky bibasilar atelectasis.

## 2023-12-09 DIAGNOSIS — Z6837 Body mass index (BMI) 37.0-37.9, adult: Secondary | ICD-10-CM | POA: Diagnosis not present

## 2023-12-09 DIAGNOSIS — F319 Bipolar disorder, unspecified: Secondary | ICD-10-CM | POA: Diagnosis not present

## 2023-12-09 DIAGNOSIS — M19012 Primary osteoarthritis, left shoulder: Secondary | ICD-10-CM | POA: Diagnosis not present

## 2023-12-09 DIAGNOSIS — I10 Essential (primary) hypertension: Secondary | ICD-10-CM | POA: Diagnosis not present

## 2023-12-09 DIAGNOSIS — M19011 Primary osteoarthritis, right shoulder: Secondary | ICD-10-CM | POA: Diagnosis not present

## 2023-12-09 DIAGNOSIS — E782 Mixed hyperlipidemia: Secondary | ICD-10-CM | POA: Diagnosis not present

## 2024-01-13 DIAGNOSIS — Z6833 Body mass index (BMI) 33.0-33.9, adult: Secondary | ICD-10-CM | POA: Diagnosis not present

## 2024-01-13 DIAGNOSIS — M25552 Pain in left hip: Secondary | ICD-10-CM | POA: Diagnosis not present

## 2024-02-13 ENCOUNTER — Other Ambulatory Visit: Payer: Self-pay

## 2024-02-13 ENCOUNTER — Ambulatory Visit: Admitting: Orthopaedic Surgery

## 2024-02-13 VITALS — Ht 71.0 in | Wt 241.0 lb

## 2024-02-13 DIAGNOSIS — M25552 Pain in left hip: Secondary | ICD-10-CM | POA: Diagnosis not present

## 2024-02-13 DIAGNOSIS — M1612 Unilateral primary osteoarthritis, left hip: Secondary | ICD-10-CM

## 2024-02-13 MED ORDER — TIZANIDINE HCL 4 MG PO TABS: 4.0000 mg | ORAL_TABLET | Freq: Three times a day (TID) | ORAL | 0 refills | Status: DC | PRN

## 2024-02-13 MED ORDER — TRAMADOL HCL 50 MG PO TABS: 50.0000 mg | ORAL_TABLET | Freq: Two times a day (BID) | ORAL | 0 refills | Status: DC | PRN

## 2024-02-13 NOTE — Progress Notes (Signed)
 The patient is a 59 year old gentleman who I have seen in the past.  We actually replaced his right hip secondary to severe end-stage arthritis back in 2021.  He has been having worsening left hip pain for over a year now and is walk with a limp but then he had a fall before Thanksgiving and his hip has been severely painful since then.  He is having to ambulate with a rolling walker.  He has also had extensive lumbar spine surgery since we replaced his right hip.  On exam his left hip has essentially significant limitations with any range of motion with severe pain and discomfort.  His right operative hip moves smoothly and fluidly.  He does have a general bilobar gait.  There is also likely difference with his left side shorter than the right.  An AP pelvis and lateral of the left hip show severe end-stage arthritis of the left hip.  There is complete loss of joint space with bone-on-bone wear and flattening of the femoral head.  There are sclerotic changes as well.  He is not on blood thinning medication and is not a diabetic.  At this point no other conservative treatment is going to be helpful.  Exercises were physical therapy will not be helpful nor will steroid injections.  There is essentially no hip joint space left for steroid injection to have any benefit.  We did talk in length in detail about hip replacement surgery.  Having this before on the right side he is fully aware what the surgery involves including the risks and benefits and what to expect from an intraoperative and postoperative standpoint.  I did review his past medical history and medications within epic.  We will work on getting him scheduled for a left total hip arthroplasty in the near future.  We will send in some tramadol  and tizanidine  for the interim.

## 2024-02-24 ENCOUNTER — Other Ambulatory Visit: Payer: Self-pay | Admitting: Orthopaedic Surgery

## 2024-03-17 ENCOUNTER — Ambulatory Visit (HOSPITAL_COMMUNITY): Admit: 2024-03-17 | Admitting: Orthopaedic Surgery

## 2024-03-30 ENCOUNTER — Encounter: Admitting: Orthopaedic Surgery

## 2024-04-13 ENCOUNTER — Ambulatory Visit: Admitting: Physician Assistant
